# Patient Record
Sex: Female | Born: 1980 | Race: Black or African American | Hispanic: No | Marital: Married | Smoking: Never smoker
Health system: Southern US, Community
[De-identification: ages and names within clinical notes are randomized; demographics above are authoritative.]

## PROBLEM LIST (undated history)

## (undated) ENCOUNTER — Inpatient Hospital Stay (HOSPITAL_COMMUNITY): Payer: Self-pay

## (undated) DIAGNOSIS — K289 Gastrojejunal ulcer, unspecified as acute or chronic, without hemorrhage or perforation: Secondary | ICD-10-CM

## (undated) HISTORY — DX: Gastrojejunal ulcer, unspecified as acute or chronic, without hemorrhage or perforation: K28.9

---

## 2014-10-25 NOTE — L&D Delivery Note (Cosign Needed)
Delivery Note At 8:24 AM a viable female was delivered via  (Presentation: OA).  APGAR: 9, 10; weight pending .   Placenta status: Intact, Spontaneous.  Cord: 3v  with the following complications:  Anesthesia:  none Episiotomy:  none Lacerations:  none Suture Repair: none Est. Blood Loss (mL):  Est 300cc  Mom to postpartum.  Baby to Couplet care / Skin to Skin.  Upon arrival patient was complete and pushing, after progressing quickly, with SROM at 0800. She pushed with good maternal effort, FHT decreased HR to 90s, placed on oxygen with improvement, continued pushing to deliver a healthy boy. Baby delivered without difficulty, loose nuchal cord easily reduced on delivery, was noted to have good tone and place on maternal abdomen for oral suctioning, drying and stimulation. Delayed cord clamping performed and cut. Placenta delivered intact with 3V cord. Vaginal canal and perineum was inspected and  intact. Pitocin was started and uterus massaged until bleeding slowed. Counts of sharps, instruments, and lap pads were all correct.    Priscilla PilarAlexander Karamalegos, DO [facility], PGY-2 03/14/2015, 8:35 AM  I was present for the delivery and agree the above assessment. CRESENZO-DISHMAN,Priscilla Delgado 03/15/2015 11:07 AM

## 2014-12-17 ENCOUNTER — Encounter (HOSPITAL_COMMUNITY): Payer: Self-pay | Admitting: *Deleted

## 2014-12-17 ENCOUNTER — Inpatient Hospital Stay (HOSPITAL_COMMUNITY)
Admission: AD | Admit: 2014-12-17 | Discharge: 2014-12-17 | Disposition: A | Discharge status: Home / Self Care | Payer: Medicaid Other | Source: Ambulatory Visit | Attending: Family Medicine | Admitting: Family Medicine

## 2014-12-17 DIAGNOSIS — R109 Unspecified abdominal pain: Secondary | ICD-10-CM | POA: Diagnosis present

## 2014-12-17 DIAGNOSIS — R1031 Right lower quadrant pain: Secondary | ICD-10-CM | POA: Diagnosis not present

## 2014-12-17 DIAGNOSIS — O26899 Other specified pregnancy related conditions, unspecified trimester: Secondary | ICD-10-CM

## 2014-12-17 DIAGNOSIS — Z3A24 24 weeks gestation of pregnancy: Secondary | ICD-10-CM | POA: Insufficient documentation

## 2014-12-17 DIAGNOSIS — K5900 Constipation, unspecified: Secondary | ICD-10-CM | POA: Diagnosis not present

## 2014-12-17 DIAGNOSIS — N949 Unspecified condition associated with female genital organs and menstrual cycle: Secondary | ICD-10-CM

## 2014-12-17 DIAGNOSIS — O9989 Other specified diseases and conditions complicating pregnancy, childbirth and the puerperium: Secondary | ICD-10-CM | POA: Insufficient documentation

## 2014-12-17 DIAGNOSIS — R102 Pelvic and perineal pain: Secondary | ICD-10-CM

## 2014-12-17 LAB — FETAL FIBRONECTIN: Fetal Fibronectin: NEGATIVE

## 2014-12-17 LAB — URINALYSIS, ROUTINE W REFLEX MICROSCOPIC
Bilirubin Urine: NEGATIVE
GLUCOSE, UA: NEGATIVE mg/dL
Ketones, ur: 15 mg/dL — AB
Leukocytes, UA: NEGATIVE
Nitrite: NEGATIVE
Protein, ur: NEGATIVE mg/dL
Specific Gravity, Urine: >1.03 — ABNORMAL HIGH (ref 1.005–1.030)
Urobilinogen, UA: 0.2 mg/dL (ref 0.0–1.0)
pH: 5.5 (ref 5.0–8.0)

## 2014-12-17 LAB — CBC
HCT: 34.4 % — ABNORMAL LOW (ref 36.0–46.0)
Hemoglobin: 11.7 g/dL — ABNORMAL LOW (ref 12.0–15.0)
MCH: 30.2 pg (ref 26.0–34.0)
MCHC: 34 g/dL (ref 30.0–36.0)
MCV: 88.7 fL (ref 78.0–100.0)
PLATELETS: 176 10*3/uL (ref 150–400)
RBC: 3.88 MIL/uL (ref 3.87–5.11)
RDW: 14 % (ref 11.5–15.5)
WBC: 7 10*3/uL (ref 4.0–10.5)

## 2014-12-17 LAB — URINE MICROSCOPIC-ADD ON

## 2014-12-17 NOTE — MAU Note (Signed)
No pain with urination, can be difficult to pee. Has been constipated

## 2014-12-17 NOTE — MAU Provider Note (Signed)
  History     CSN: [id number]  Arrival date and time: 12/17/14 1442   First Provider Initiated Contact with Patient 12/17/14 1641      Chief Complaint  Patient presents with  . Abdominal Pain   Abdominal Pain Associated symptoms include constipation and dysuria. Pertinent negatives include no diarrhea, fever, nausea or vomiting.   34yo female presenting today for pelvic pain that first started this morning. History taken with aid of Swahili interpreter. Denies nausea, vomiting, fevers. Denies contractionsAdmits to constipation and burning with urination since this morning. Pain located on right side of pelvis. No further concerns today.  OB History    Gravida Para Term Preterm AB TAB SAB Ectopic Multiple Living   3 2 2       2       No past medical history on file.  No past surgical history on file.  No family history on file.  History  Substance Use Topics  . Smoking status: Not on file  . Smokeless tobacco: Not on file  . Alcohol Use: Not on file    Allergies: No Known Allergies  Prescriptions prior to admission  Medication Sig Dispense Refill Last Dose  . Prenatal Vit-Fe Fumarate-FA (PRENATAL MULTIVITAMIN) TABS tablet Take 1 tablet by mouth daily at 12 noon.   12/17/2014 at Unknown time    Review of Systems  Constitutional: Negative for fever.  Cardiovascular: Negative for chest pain.  Gastrointestinal: Positive for abdominal pain and constipation. Negative for nausea, vomiting and diarrhea.  Genitourinary: Positive for dysuria.   Physical Exam   Blood pressure 116/54, pulse 66, temperature 98.2 F (36.8 C), temperature source Oral, resp. rate 18, weight 116.574 kg (257 lb).  Physical Exam  Constitutional: She appears well-developed and well-nourished. No distress.  Respiratory: Breath sounds normal. No respiratory distress. She has no wheezes.  GI: Soft. There is no tenderness.  Musculoskeletal: She exhibits no edema.  Tenderness with palpation of right  round ligament, improved after OMM    MAU Course  Procedures  MDM OMM- myofascial release done on right round ligament with improvement of pain UA: large hemoglobin, calcium oxalate crystals Fetal Fibronectin- negative  Assessment and Plan  # Round Ligament Pain - OMM right round ligament--improvement in pain  # Constipation  I have seen and assessed this patient, and agree with the above resident's note.   Cervix 0/thick/high (1cm external os) Pt reports bilateral inguinal pain, worse on right, with increased pain with movement/position change.  Teaching done about round ligament pain, use rest/ice/heat/Tylenol PRN.  Increase PO fluids. Keep scheduled appt in [facility] on Thursday.    LEFTWICH-KIRBY, Kalan Yeley 12/17/2014, 9:07 PM

## 2014-12-17 NOTE — Discharge Instructions (Signed)

## 2014-12-17 NOTE — MAU Note (Signed)
Pain in RLQ started this morning. Hasn't gotten worse

## 2014-12-19 ENCOUNTER — Ambulatory Visit (INDEPENDENT_AMBULATORY_CARE_PROVIDER_SITE_OTHER): Payer: Medicaid Other | Admitting: Family Medicine

## 2014-12-19 ENCOUNTER — Encounter: Payer: Self-pay | Admitting: Family Medicine

## 2014-12-19 VITALS — BP 98/53 | HR 80 | Temp 97.7°F | Ht 64.75 in | Wt 253.3 lb

## 2014-12-19 DIAGNOSIS — Z113 Encounter for screening for infections with a predominantly sexual mode of transmission: Secondary | ICD-10-CM

## 2014-12-19 DIAGNOSIS — Z3492 Encounter for supervision of normal pregnancy, unspecified, second trimester: Secondary | ICD-10-CM

## 2014-12-19 DIAGNOSIS — Z1151 Encounter for screening for human papillomavirus (HPV): Secondary | ICD-10-CM | POA: Diagnosis not present

## 2014-12-19 DIAGNOSIS — Z124 Encounter for screening for malignant neoplasm of cervix: Secondary | ICD-10-CM

## 2014-12-19 DIAGNOSIS — Z118 Encounter for screening for other infectious and parasitic diseases: Secondary | ICD-10-CM

## 2014-12-19 LAB — POCT URINALYSIS DIP (DEVICE)
Bilirubin Urine: NEGATIVE
Glucose, UA: NEGATIVE mg/dL
Hgb urine dipstick: NEGATIVE
KETONES UR: 15 mg/dL — AB
Leukocytes, UA: NEGATIVE
Nitrite: NEGATIVE
Protein, ur: NEGATIVE mg/dL
SPECIFIC GRAVITY, URINE: 1.02 (ref 1.005–1.030)
Urobilinogen, UA: 0.2 mg/dL (ref 0.0–1.0)
pH: 6 (ref 5.0–8.0)

## 2014-12-19 LAB — CULTURE, OB URINE
Colony Count: 40000
Special Requests: NORMAL

## 2014-12-19 MED ORDER — PRENATAL VITAMINS PLUS 27-1 MG PO TABS: 1.0000 | ORAL_TABLET | Freq: Every day | ORAL | Status: DC

## 2014-12-19 MED ORDER — DOCUSATE SODIUM 250 MG PO CAPS: 250.0000 mg | ORAL_CAPSULE | Freq: Every day | ORAL | Status: DC

## 2014-12-19 MED ORDER — FAMOTIDINE 40 MG PO TABS: 40.0000 mg | ORAL_TABLET | Freq: Every day | ORAL | Status: DC

## 2014-12-19 NOTE — Progress Notes (Signed)
Nutrition note: 1st visit consult Pt has h/o obesity. Pt has gained 18.3# @ 4679w5d, which is > expected. Pt reports eating 2-3x/d. Pt is unsure if she is taking a PNV; reports she thinks she has a bottle of vitamin C at home. Pt reports no N/V but has heartburn occ. NKFA. Pt received verbal & written education via an interpreter about general nutrition during pregnancy. Provided a note for her case worker to see what vitamins she has been taking & to encourage pt to take 1 PNV daily. Encouraged protein foods with all meals & snacks. Discussed wt gain goals of 11-20# or 0.5#/wk. Pt agrees to take a PNV daily. Pt has WIC & plans to BF. F/u as needed Blondell RevealLaura Chanice Brenton, MS, RD, LDN, [facility]

## 2014-12-19 NOTE — Progress Notes (Signed)
   Subjective:    Priscilla Delgado is a [id number] 7839w5d being seen today for her first obstetrical visit.  Her obstetrical history is significant for denies. Patient does intend to breast feed. Pregnancy history fully reviewed.  Pain in right breast x 1year +, reports having had an abscess once and since has had some pain.  Patient reports no bleeding, no contractions, no cramping and no leaking.  +reflux, +constipation  Filed Vitals:   12/19/14 1036 12/19/14 1037  BP: 98/53   Pulse: 80   Temp: 97.7 F (36.5 C)   Height:  5\' 4"  (1.626 m)  Weight: 253 lb 4.8 oz (114.896 kg)     HISTORY: OB History  Gravida Para Term Preterm AB SAB TAB Ectopic Multiple Living  3 2 2  0 0 0 0 0 0 2    # Outcome Date GA Lbr Len/2nd Weight Sex Delivery Anes PTL Lv  3 Current           2 Term 07/03/10 1772w0d  5 lb 8.2 oz (2.5 kg) F Vag-Spont   Y  1 Term 10/19/08 4872w0d  6 lb 9.8 oz (3 kg) M CS-Unspec Spinal  Y     Past Medical History  Diagnosis Date  . Ulcer of the stomach and intestine    Past Surgical History  Procedure Laterality Date  . Cesarean section     2/2 footling breech  History reviewed. No pertinent family history.   Exam    Uterus:     Pelvic Exam:    Perineum: No Hemorrhoids, Normal Perineum   Vulva: normal   Vagina:  normal mucosa       Cervix: no bleeding following Pap and no cervical motion tenderness   Adnexa: normal adnexa   Bony Pelvis: average  System: Breast:  normal appearance, no masses or tenderness   Skin: normal coloration and turgor, no rashes    Neurologic: oriented   Extremities: normal strength, tone, and muscle mass   HEENT PERRLA   Mouth/Teeth mucous membranes moist, pharynx normal without lesions   Neck supple and no masses   Cardiovascular: Normal rate   Respiratory:  appears well, vitals normal, no respiratory distress, acyanotic, normal RR, ear and throat exam is normal, neck free of mass or lymphadenopathy, chest clear, no wheezing, crepitations,  rhonchi, normal symmetric air entry   Abdomen: soft, non-tender; bowel sounds normal; no masses,  no organomegaly   Urinary: urethral meatus normal      Assessment:    Pregnancy: G3P2002 There are no active problems to display for this patient.       Plan:     Initial labs drawn. Prenatal vitamins. Problem list reviewed and updated. Genetic Screening discussed Quad Screen: requested.  Ultrasound discussed; fetal survey: requested.  Follow up in 4 weeks. 50% of 20 min visit spent on counseling and coordination of care.    Breast pain: no masses/skin changes/abnormalities on exam  Gave rx for colace, PNV, pepcid  Terriona Horlacher ROCIO 12/19/2014

## 2014-12-19 NOTE — Progress Notes (Signed)
Patient unsure of LMP, thinks sometime in September; needs RX for prenatal vitamins, needs RX for stool softeners- reports difficulty having bowel movement  Aline used for interpreter

## 2014-12-20 LAB — PRENATAL PROFILE (SOLSTAS)
Antibody Screen: NEGATIVE
BASOS ABS: 0 10*3/uL (ref 0.0–0.1)
BASOS PCT: 0 % (ref 0–1)
EOS PCT: 1 % (ref 0–5)
Eosinophils Absolute: 0.1 10*3/uL (ref 0.0–0.7)
HCT: 34.9 % — ABNORMAL LOW (ref 36.0–46.0)
HEP B S AG: NEGATIVE
HIV 1&2 Ab, 4th Generation: NONREACTIVE
Hemoglobin: 11.9 g/dL — ABNORMAL LOW (ref 12.0–15.0)
LYMPHS PCT: 20 % (ref 12–46)
Lymphs Abs: 1.2 10*3/uL (ref 0.7–4.0)
MCH: 30 pg (ref 26.0–34.0)
MCHC: 34.1 g/dL (ref 30.0–36.0)
MCV: 87.9 fL (ref 78.0–100.0)
MPV: 11.2 fL (ref 8.6–12.4)
Monocytes Absolute: 0.4 10*3/uL (ref 0.1–1.0)
Monocytes Relative: 6 % (ref 3–12)
NEUTROS ABS: 4.5 10*3/uL (ref 1.7–7.7)
NEUTROS PCT: 73 % (ref 43–77)
Platelets: 201 10*3/uL (ref 150–400)
RBC: 3.97 MIL/uL (ref 3.87–5.11)
RDW: 13.9 % (ref 11.5–15.5)
RUBELLA: 18.7 {index} — AB (ref <0.90)
Rh Type: POSITIVE
WBC: 6.1 10*3/uL (ref 4.0–10.5)

## 2014-12-20 LAB — PRESCRIPTION MONITORING PROFILE (19 PANEL)
Amphetamine/Meth: NEGATIVE ng/mL
BENZODIAZEPINE SCREEN, URINE: NEGATIVE ng/mL
Barbiturate Screen, Urine: NEGATIVE ng/mL
Buprenorphine, Urine: NEGATIVE ng/mL
CREATININE, URINE: 138.69 mg/dL (ref >20.0)
Cannabinoid Scrn, Ur: NEGATIVE ng/mL
Carisoprodol, Urine: NEGATIVE ng/mL
Cocaine Metabolites: NEGATIVE ng/mL
FENTANYL URINE: NEGATIVE ng/mL
MDMA URINE: NEGATIVE ng/mL
Meperidine, Ur: NEGATIVE ng/mL
Methadone Screen, Urine: NEGATIVE ng/mL
Methaqualone: NEGATIVE ng/mL
NITRITES URINE, INITIAL: NEGATIVE ug/mL
OPIATE SCREEN, URINE: NEGATIVE ng/mL
OXYCODONE SCRN UR: NEGATIVE ng/mL
PH URINE, INITIAL: 5.7 pH (ref 4.5–8.9)
PROPOXYPHENE: NEGATIVE ng/mL
Phencyclidine, Ur: NEGATIVE ng/mL
TAPENTADOLUR: NEGATIVE ng/mL
Tramadol Scrn, Ur: NEGATIVE ng/mL
ZOLPIDEM, URINE: NEGATIVE ng/mL

## 2014-12-20 LAB — GLUCOSE TOLERANCE, 1 HOUR (50G) W/O FASTING: GLUCOSE 1 HOUR GTT: 111 mg/dL (ref 70–140)

## 2014-12-21 DIAGNOSIS — Z3490 Encounter for supervision of normal pregnancy, unspecified, unspecified trimester: Secondary | ICD-10-CM | POA: Insufficient documentation

## 2014-12-23 LAB — HEMOGLOBINOPATHY EVALUATION
HGB F QUANT: 0 % (ref 0.0–2.0)
Hemoglobin Other: 0 %
Hgb A2 Quant: 2.7 % (ref 2.2–3.2)
Hgb A: 97.3 % (ref 96.8–97.8)
Hgb S Quant: 0 %

## 2014-12-23 LAB — CYTOLOGY - PAP

## 2014-12-26 ENCOUNTER — Ambulatory Visit (HOSPITAL_COMMUNITY)
Admission: RE | Admit: 2014-12-26 | Discharge: 2014-12-26 | Disposition: A | Discharge status: Home / Self Care | Payer: Medicaid Other | Source: Ambulatory Visit | Attending: Family Medicine | Admitting: Family Medicine

## 2014-12-26 DIAGNOSIS — Z3A27 27 weeks gestation of pregnancy: Secondary | ICD-10-CM | POA: Insufficient documentation

## 2014-12-26 DIAGNOSIS — O0932 Supervision of pregnancy with insufficient antenatal care, second trimester: Secondary | ICD-10-CM | POA: Diagnosis not present

## 2014-12-26 DIAGNOSIS — Z3689 Encounter for other specified antenatal screening: Secondary | ICD-10-CM | POA: Insufficient documentation

## 2014-12-26 DIAGNOSIS — Z36 Encounter for antenatal screening of mother: Secondary | ICD-10-CM | POA: Diagnosis present

## 2014-12-26 DIAGNOSIS — Z3492 Encounter for supervision of normal pregnancy, unspecified, second trimester: Secondary | ICD-10-CM

## 2015-01-16 ENCOUNTER — Ambulatory Visit (INDEPENDENT_AMBULATORY_CARE_PROVIDER_SITE_OTHER): Payer: Self-pay | Admitting: Family Medicine

## 2015-01-16 VITALS — BP 100/50 | HR 73 | Temp 98.3°F | Wt 259.3 lb

## 2015-01-16 DIAGNOSIS — Z3492 Encounter for supervision of normal pregnancy, unspecified, second trimester: Secondary | ICD-10-CM

## 2015-01-16 LAB — POCT URINALYSIS DIP (DEVICE)
Bilirubin Urine: NEGATIVE
Glucose, UA: NEGATIVE mg/dL
Hgb urine dipstick: NEGATIVE
KETONES UR: NEGATIVE mg/dL
Nitrite: NEGATIVE
PROTEIN: NEGATIVE mg/dL
Specific Gravity, Urine: 1.025 (ref 1.005–1.030)
UROBILINOGEN UA: 0.2 mg/dL (ref 0.0–1.0)
pH: 6 (ref 5.0–8.0)

## 2015-01-16 NOTE — Progress Notes (Signed)
Used Interpreter [employer].

## 2015-01-16 NOTE — Progress Notes (Signed)
Patient is 34 y.o. [id number] 6228w5d.  +FM, denies LOF, VB, contractions, vaginal discharge.  Overall feeling well. - #abn pap: ASCUS: colpo at next visit, discussed with patient

## 2015-01-30 ENCOUNTER — Encounter: Payer: Self-pay | Admitting: Obstetrics and Gynecology

## 2015-01-30 ENCOUNTER — Ambulatory Visit (INDEPENDENT_AMBULATORY_CARE_PROVIDER_SITE_OTHER): Payer: Medicaid Other | Admitting: Obstetrics and Gynecology

## 2015-01-30 VITALS — BP 102/52 | HR 75 | Wt 258.1 lb

## 2015-01-30 DIAGNOSIS — Z23 Encounter for immunization: Secondary | ICD-10-CM

## 2015-01-30 DIAGNOSIS — Z98891 History of uterine scar from previous surgery: Secondary | ICD-10-CM | POA: Insufficient documentation

## 2015-01-30 DIAGNOSIS — O3421 Maternal care for scar from previous cesarean delivery: Secondary | ICD-10-CM

## 2015-01-30 DIAGNOSIS — Z3493 Encounter for supervision of normal pregnancy, unspecified, third trimester: Secondary | ICD-10-CM

## 2015-01-30 DIAGNOSIS — Z3492 Encounter for supervision of normal pregnancy, unspecified, second trimester: Secondary | ICD-10-CM

## 2015-01-30 DIAGNOSIS — O34219 Maternal care for unspecified type scar from previous cesarean delivery: Secondary | ICD-10-CM

## 2015-01-30 LAB — CBC
HCT: 36.5 % (ref 36.0–46.0)
Hemoglobin: 12.2 g/dL (ref 12.0–15.0)
MCH: 30 pg (ref 26.0–34.0)
MCHC: 33.4 g/dL (ref 30.0–36.0)
MCV: 89.7 fL (ref 78.0–100.0)
MPV: 11.4 fL (ref 8.6–12.4)
PLATELETS: 181 10*3/uL (ref 150–400)
RBC: 4.07 MIL/uL (ref 3.87–5.11)
RDW: 13.8 % (ref 11.5–15.5)
WBC: 7.9 10*3/uL (ref 4.0–10.5)

## 2015-01-30 LAB — POCT URINALYSIS DIP (DEVICE)
Bilirubin Urine: NEGATIVE
Glucose, UA: NEGATIVE mg/dL
HGB URINE DIPSTICK: NEGATIVE
Ketones, ur: NEGATIVE mg/dL
NITRITE: NEGATIVE
PH: 7 (ref 5.0–8.0)
PROTEIN: NEGATIVE mg/dL
Specific Gravity, Urine: 1.02 (ref 1.005–1.030)
UROBILINOGEN UA: 0.2 mg/dL (ref 0.0–1.0)

## 2015-01-30 MED ORDER — TETANUS-DIPHTH-ACELL PERTUSSIS 5-2.5-18.5 LF-MCG/0.5 IM SUSP
0.5000 mL | Freq: Once | INTRAMUSCULAR | Status: AC
Start: 2015-01-30 — End: 2015-01-30
  Administered 2015-01-30: 0.5 mL via INTRAMUSCULAR

## 2015-01-30 MED ORDER — FAMOTIDINE 40 MG PO TABS: 40.0000 mg | ORAL_TABLET | Freq: Every day | ORAL | Status: DC

## 2015-01-30 NOTE — Progress Notes (Signed)
28 week labs today.  

## 2015-01-30 NOTE — Progress Notes (Signed)
Small leuks in urine.  

## 2015-01-30 NOTE — Progress Notes (Signed)
Patient is doing well without complaints. FM/PTL precautions reviewed. Patient reports experiencing some epigastric pain with food. She did not try to the prescribed pepcid. Advised to try pepcid for now. 1 hr GCT and labs today

## 2015-01-31 LAB — HIV ANTIBODY (ROUTINE TESTING W REFLEX): HIV: NONREACTIVE

## 2015-01-31 LAB — GLUCOSE TOLERANCE, 1 HOUR (50G) W/O FASTING: GLUCOSE 1 HOUR GTT: 74 mg/dL (ref 70–140)

## 2015-01-31 LAB — RPR

## 2015-02-12 ENCOUNTER — Encounter: Payer: Self-pay | Admitting: *Deleted

## 2015-02-13 ENCOUNTER — Ambulatory Visit (INDEPENDENT_AMBULATORY_CARE_PROVIDER_SITE_OTHER): Payer: Medicaid Other | Admitting: Obstetrics & Gynecology

## 2015-02-13 VITALS — BP 91/50 | HR 75 | Temp 97.9°F | Wt 257.8 lb

## 2015-02-13 DIAGNOSIS — Z3483 Encounter for supervision of other normal pregnancy, third trimester: Secondary | ICD-10-CM

## 2015-02-13 DIAGNOSIS — R238 Other skin changes: Secondary | ICD-10-CM

## 2015-02-13 DIAGNOSIS — T148XXA Other injury of unspecified body region, initial encounter: Secondary | ICD-10-CM

## 2015-02-13 LAB — POCT URINALYSIS DIP (DEVICE)
Bilirubin Urine: NEGATIVE
Glucose, UA: NEGATIVE mg/dL
HGB URINE DIPSTICK: NEGATIVE
Ketones, ur: NEGATIVE mg/dL
NITRITE: NEGATIVE
PH: 7 (ref 5.0–8.0)
PROTEIN: NEGATIVE mg/dL
Specific Gravity, Urine: 1.02 (ref 1.005–1.030)
UROBILINOGEN UA: 0.2 mg/dL (ref 0.0–1.0)

## 2015-02-13 MED ORDER — BACITRACIN 500 UNIT/GM EX OINT: 1.0000 "application " | TOPICAL_OINTMENT | Freq: Two times a day (BID) | CUTANEOUS | Status: DC

## 2015-02-13 NOTE — Patient Instructions (Signed)
Third Trimester of Pregnancy The third trimester is from week 29 through week 42, months 7 through 9. The third trimester is a time when the fetus is growing rapidly. At the end of the ninth month, the fetus is about 20 inches in length and weighs 6-10 pounds.  BODY CHANGES Your body goes through many changes during pregnancy. The changes vary from woman to woman.   Your weight will continue to increase. You can expect to gain 25-35 pounds (11-16 kg) by the end of the pregnancy.  You may begin to get stretch marks on your hips, abdomen, and breasts.  You may urinate more often because the fetus is moving lower into your pelvis and pressing on your bladder.  You may develop or continue to have heartburn as a result of your pregnancy.  You may develop constipation because certain hormones are causing the muscles that push waste through your intestines to slow down.  You may develop hemorrhoids or swollen, bulging veins (varicose veins).  You may have pelvic pain because of the weight gain and pregnancy hormones relaxing your joints between the bones in your pelvis. Backaches may result from overexertion of the muscles supporting your posture.  You may have changes in your hair. These can include thickening of your hair, rapid growth, and changes in texture. Some women also have hair loss during or after pregnancy, or hair that feels dry or thin. Your hair will most likely return to normal after your baby is born.  Your breasts will continue to grow and be tender. A yellow discharge may leak from your breasts called colostrum.  Your belly button may stick out.  You may feel short of breath because of your expanding uterus.  You may notice the fetus "dropping," or moving lower in your abdomen.  You may have a bloody mucus discharge. This usually occurs a few days to a week before labor begins.  Your cervix becomes thin and soft (effaced) near your due date. WHAT TO EXPECT AT YOUR PRENATAL  EXAMS  You will have prenatal exams every 2 weeks until week 36. Then, you will have weekly prenatal exams. During a routine prenatal visit:  You will be weighed to make sure you and the fetus are growing normally.  Your blood pressure is taken.  Your abdomen will be measured to track your baby's growth.  The fetal heartbeat will be listened to.  Any test results from the previous visit will be discussed.  You may have a cervical check near your due date to see if you have effaced. At around 36 weeks, your caregiver will check your cervix. At the same time, your caregiver will also perform a test on the secretions of the vaginal tissue. This test is to determine if a type of bacteria, Group B streptococcus, is present. Your caregiver will explain this further. Your caregiver may ask you:  What your birth plan is.  How you are feeling.  If you are feeling the baby move.  If you have had any abnormal symptoms, such as leaking fluid, bleeding, severe headaches, or abdominal cramping.  If you have any questions. Other tests or screenings that may be performed during your third trimester include:  Blood tests that check for low iron levels (anemia).  Fetal testing to check the health, activity level, and growth of the fetus. Testing is done if you have certain medical conditions or if there are problems during the pregnancy. FALSE LABOR You may feel small, irregular contractions that   eventually go away. These are called Braxton Hicks contractions, or false labor. Contractions may last for hours, days, or even weeks before true labor sets in. If contractions come at regular intervals, intensify, or become painful, it is best to be seen by your caregiver.  SIGNS OF LABOR   Menstrual-like cramps.  Contractions that are 5 minutes apart or less.  Contractions that start on the top of the uterus and spread down to the lower abdomen and back.  A sense of increased pelvic pressure or back  pain.  A watery or bloody mucus discharge that comes from the vagina. If you have any of these signs before the 37th week of pregnancy, call your caregiver right away. You need to go to the hospital to get checked immediately. HOME CARE INSTRUCTIONS   Avoid all smoking, herbs, alcohol, and unprescribed drugs. These chemicals affect the formation and growth of the baby.  Follow your caregiver's instructions regarding medicine use. There are medicines that are either safe or unsafe to take during pregnancy.  Exercise only as directed by your caregiver. Experiencing uterine cramps is a good sign to stop exercising.  Continue to eat regular, healthy meals.  Wear a good support bra for breast tenderness.  Do not use hot tubs, steam rooms, or saunas.  Wear your seat belt at all times when driving.  Avoid raw meat, uncooked cheese, cat litter boxes, and soil used by cats. These carry germs that can cause birth defects in the baby.  Take your prenatal vitamins.  Try taking a stool softener (if your caregiver approves) if you develop constipation. Eat more high-fiber foods, such as fresh vegetables or fruit and whole grains. Drink plenty of fluids to keep your urine clear or pale yellow.  Take warm sitz baths to soothe any pain or discomfort caused by hemorrhoids. Use hemorrhoid cream if your caregiver approves.  If you develop varicose veins, wear support hose. Elevate your feet for 15 minutes, 3-4 times a day. Limit salt in your diet.  Avoid heavy lifting, wear low heal shoes, and practice good posture.  Rest a lot with your legs elevated if you have leg cramps or low back pain.  Visit your dentist if you have not gone during your pregnancy. Use a soft toothbrush to brush your teeth and be gentle when you floss.  A sexual relationship may be continued unless your caregiver directs you otherwise.  Do not travel far distances unless it is absolutely necessary and only with the approval  of your caregiver.  Take prenatal classes to understand, practice, and ask questions about the labor and delivery.  Make a trial run to the hospital.  Pack your hospital bag.  Prepare the baby's nursery.  Continue to go to all your prenatal visits as directed by your caregiver. SEEK MEDICAL CARE IF:  You are unsure if you are in labor or if your water has broken.  You have dizziness.  You have mild pelvic cramps, pelvic pressure, or nagging pain in your abdominal area.  You have persistent nausea, vomiting, or diarrhea.  You have a bad smelling vaginal discharge.  You have pain with urination. SEEK IMMEDIATE MEDICAL CARE IF:   You have a fever.  You are leaking fluid from your vagina.  You have spotting or bleeding from your vagina.  You have severe abdominal cramping or pain.  You have rapid weight loss or gain.  You have shortness of breath with chest pain.  You notice sudden or extreme swelling   of your face, hands, ankles, feet, or legs.  You have not felt your baby move in over an hour.  You have severe headaches that do not go away with medicine.  You have vision changes. Document Released: 10/05/2001 Document Revised: 10/16/2013 Document Reviewed: 12/12/2012 ExitCare Patient Information 2015 [employer]. This information is not intended to replace advice given to you by your health care provider. Make sure you discuss any questions you have with your health care provider.  

## 2015-02-13 NOTE — Progress Notes (Signed)
Small leuks on UA 

## 2015-02-13 NOTE — Progress Notes (Signed)
No problems 568w5d Bacitracin to wound on left knee

## 2015-02-13 NOTE — Progress Notes (Signed)
Patient reports non healing wound for past 2 weeks that is bothering her.  Aline used for interpreter

## 2015-02-26 ENCOUNTER — Encounter: Payer: Medicaid Other | Admitting: Certified Nurse Midwife

## 2015-03-03 LAB — OB RESULTS CONSOLE GC/CHLAMYDIA
Chlamydia: NEGATIVE
GC PROBE AMP, GENITAL: NEGATIVE

## 2015-03-03 LAB — OB RESULTS CONSOLE GBS: GBS: POSITIVE

## 2015-03-05 ENCOUNTER — Encounter: Payer: Self-pay | Admitting: Family

## 2015-03-05 ENCOUNTER — Ambulatory Visit (INDEPENDENT_AMBULATORY_CARE_PROVIDER_SITE_OTHER): Payer: Medicaid Other | Admitting: Family

## 2015-03-05 VITALS — BP 109/54 | HR 68 | Temp 97.6°F | Wt 264.2 lb

## 2015-03-05 DIAGNOSIS — Z3492 Encounter for supervision of normal pregnancy, unspecified, second trimester: Secondary | ICD-10-CM

## 2015-03-05 DIAGNOSIS — Z3493 Encounter for supervision of normal pregnancy, unspecified, third trimester: Secondary | ICD-10-CM

## 2015-03-05 LAB — POCT URINALYSIS DIP (DEVICE)
Bilirubin Urine: NEGATIVE
Glucose, UA: NEGATIVE mg/dL
Hgb urine dipstick: NEGATIVE
Ketones, ur: NEGATIVE mg/dL
Nitrite: NEGATIVE
Protein, ur: NEGATIVE mg/dL
Specific Gravity, Urine: 1.025 (ref 1.005–1.030)
Urobilinogen, UA: 0.2 mg/dL (ref 0.0–1.0)
pH: 7 (ref 5.0–8.0)

## 2015-03-05 LAB — OB RESULTS CONSOLE GC/CHLAMYDIA
Chlamydia: NEGATIVE
Gonorrhea: NEGATIVE

## 2015-03-05 LAB — OB RESULTS CONSOLE GBS: GBS: POSITIVE

## 2015-03-05 MED ORDER — FAMOTIDINE 40 MG PO TABS: 40.0000 mg | ORAL_TABLET | Freq: Every day | ORAL | Status: DC

## 2015-03-05 MED ORDER — NITROFURANTOIN MONOHYD MACRO 100 MG PO CAPS: 100.0000 mg | ORAL_CAPSULE | Freq: Two times a day (BID) | ORAL | Status: DC

## 2015-03-05 MED ORDER — DOCUSATE SODIUM 250 MG PO CAPS: 250.0000 mg | ORAL_CAPSULE | Freq: Every day | ORAL | Status: DC

## 2015-03-05 NOTE — Progress Notes (Signed)
Patient would like refill on colace and pepcid  Aline Used for interpreter

## 2015-03-05 NOTE — Progress Notes (Signed)
Small leuks noted in urine.  

## 2015-03-05 NOTE — Progress Notes (Signed)
Upon review of initial ultrasound and uncertain LMP, EDD was changed to 03/27/15 which advances her EDD by three weeks.  Growth ultrasound ordered.  GBS and GC and chlamydia collected.

## 2015-03-05 NOTE — Addendum Note (Signed)
Addended by: Marlis EdelsonKARIM, Jinan Biggins N on: 03/05/2015 09:08 AM   Modules accepted: Orders, Medications

## 2015-03-05 NOTE — Progress Notes (Signed)
Small leuks in urine.  Reports dysuria last night.  RX Macrobid, urine culture sent to lab.

## 2015-03-06 LAB — GC/CHLAMYDIA PROBE AMP
CT Probe RNA: NEGATIVE
GC PROBE AMP APTIMA: NEGATIVE

## 2015-03-07 LAB — CULTURE, OB URINE: Colony Count: 3000

## 2015-03-07 LAB — CULTURE, BETA STREP (GROUP B ONLY)

## 2015-03-10 ENCOUNTER — Ambulatory Visit (HOSPITAL_COMMUNITY)
Admission: RE | Admit: 2015-03-10 | Discharge: 2015-03-10 | Disposition: A | Discharge status: Home / Self Care | Payer: Medicaid Other | Source: Ambulatory Visit | Attending: Family | Admitting: Family

## 2015-03-10 DIAGNOSIS — Z3493 Encounter for supervision of normal pregnancy, unspecified, third trimester: Secondary | ICD-10-CM | POA: Insufficient documentation

## 2015-03-12 ENCOUNTER — Encounter: Payer: Self-pay | Admitting: Certified Nurse Midwife

## 2015-03-12 ENCOUNTER — Ambulatory Visit (INDEPENDENT_AMBULATORY_CARE_PROVIDER_SITE_OTHER): Payer: Medicaid Other | Admitting: Certified Nurse Midwife

## 2015-03-12 VITALS — BP 90/58 | HR 63 | Temp 98.1°F | Wt 266.7 lb

## 2015-03-12 DIAGNOSIS — Z3493 Encounter for supervision of normal pregnancy, unspecified, third trimester: Secondary | ICD-10-CM | POA: Diagnosis not present

## 2015-03-12 DIAGNOSIS — O4103X1 Oligohydramnios, third trimester, fetus 1: Secondary | ICD-10-CM

## 2015-03-12 LAB — POCT URINALYSIS DIP (DEVICE)
Bilirubin Urine: NEGATIVE
Glucose, UA: NEGATIVE mg/dL
Hgb urine dipstick: NEGATIVE
Ketones, ur: NEGATIVE mg/dL
Nitrite: NEGATIVE
Protein, ur: NEGATIVE mg/dL
Specific Gravity, Urine: 1.015 (ref 1.005–1.030)
Urobilinogen, UA: 0.2 mg/dL (ref 0.0–1.0)
pH: 7 (ref 5.0–8.0)

## 2015-03-12 NOTE — Patient Instructions (Signed)
Trial of Labor After Cesarean Delivery Information A trial of labor after cesarean delivery (TOLAC) is when a woman tries to give birth vaginally after a previous cesarean delivery. TOLAC may be a safe and appropriate option for you depending on your medical history and other risk factors. When TOLAC is successful and you are able to have a vaginal delivery, this is called a vaginal birth after cesarean delivery (VBAC).  CANDIDATES FOR TOLAC TOLAC is possible for some women who:  Have undergone one or two prior cesarean deliveries in which the incision of the uterus was horizontal (low transverse).  Are carrying twins and have had one prior low transverse incision during a cesarean delivery.  Do not have a vertical (classical) uterine scar.  Have not had a tear in the wall of their uterus (uterine rupture). TOLAC is also supported for women who meet appropriate criteria and:  Are under the age of 40 years.  Are tall and have a body mass index (BMI) of less than 30.  Have an unknown uterine scar.  Give birth in a facility equipped to handle an emergency cesarean delivery. This team should be able to handle possible complications such as a uterine rupture.  Have thorough counseling about the benefits and risks of TOLAC.  Have discussed future pregnancy plans with their health care provider.  Plan to have several more pregnancies. MOST SUCCESSFUL CANDIDATES FOR TOLAC:  Have had a successful vaginal delivery before or after their cesarean delivery.  Experience labor that begins naturally on or before the due date (40 weeks of gestation).  Do not have a very large (macrosomic) baby.   Had a prior cesarean delivery but are not currently experiencing factors that would prompt a cesarean delivery (such as a breech position).  Had only one prior cesarean delivery.  Had a prior cesarean delivery that was performed early in labor and not after full cervical dilation. TOLAC may be most  appropriate for women who meet the above guidelines and who plan to have more pregnancies. TOLAC is not recommended for home births. LEAST SUCCESSFUL CANDIDATES FOR TOLAC:  Have an induced labor with an unfavorable cervix. An unfavorable cervix is when the cervix is not dilating enough (among other factors).  Have never had a vaginal delivery.  Have had more than two cesarean deliveries.  Have a pregnancy at more than 40 weeks of gestation.  Are pregnant with a baby with a suspected weight greater than 4,000 grams (8 pounds) and who have no prior history of a vaginal delivery.  Have closely spaced pregnancies. SUGGESTED BENEFITS OF TOLAC  You may have a faster recovery time.  You may have a shorter stay in the hospital.  You may have less pain and fewer problems than with a cesarean delivery. Women who have a cesarean delivery have a higher chance of needing blood or getting a fever, an infection, or a blood clot in the legs. SUGGESTED RISKS OF TOLAC The highest risk of complications happens to women who attempt a TOLAC and fail. A failed TOLAC results in an unplanned cesarean delivery. Risks related to TOLAC or repeat cesarean deliveries include:   Blood loss.  Infection.  Blood clot.  Injury to surrounding tissues or organs.  Having to remove the uterus (hysterectomy).  Potential problems with the placenta (such as placenta previa or placenta accreta) in future pregnancies. Although very rare, the main concerns with TOLAC are:  Rupture of the uterine scar from a past cesarean delivery.  Needing an   emergency cesarean delivery.  Having a bad outcome for the baby (perinatal morbidity). FOR MORE INFORMATION American Congress of Obstetricians and Gynecologists: www.acog.org American College of Nurse-Midwives: www.midwife.org Document Released: 06/29/2011 Document Revised: 08/01/2013 Document Reviewed: 04/02/2013 ExitCare Patient Information 2015 [employer]. This  information is not intended to replace advice given to you by your health care provider. Make sure you discuss any questions you have with your health care provider.  

## 2015-03-12 NOTE — Progress Notes (Signed)
Pt is doing well. Confirmed that she wants to have a trial of labor. Discussed her ultrasound results and the change in her due date to 03/27/15. Discussed her low normal amniotic fluid and scheduled her to check AFI next week.

## 2015-03-12 NOTE — Progress Notes (Signed)
Interpreter [id number] used for this encounter.

## 2015-03-12 NOTE — Progress Notes (Signed)
Moderate leukocytes noted on urinalysis. 

## 2015-03-13 ENCOUNTER — Encounter (HOSPITAL_COMMUNITY): Payer: Self-pay | Admitting: *Deleted

## 2015-03-13 ENCOUNTER — Inpatient Hospital Stay (HOSPITAL_COMMUNITY): Payer: Medicaid Other

## 2015-03-13 ENCOUNTER — Inpatient Hospital Stay (EMERGENCY_DEPARTMENT_HOSPITAL)
Admission: AD | Admit: 2015-03-13 | Discharge: 2015-03-13 | Disposition: A | Discharge status: Home / Self Care | Payer: Medicaid Other | Source: Ambulatory Visit | Attending: Obstetrics & Gynecology | Admitting: Obstetrics & Gynecology

## 2015-03-13 DIAGNOSIS — Z3A38 38 weeks gestation of pregnancy: Secondary | ICD-10-CM | POA: Insufficient documentation

## 2015-03-13 DIAGNOSIS — Z3A37 37 weeks gestation of pregnancy: Secondary | ICD-10-CM

## 2015-03-13 DIAGNOSIS — O479 False labor, unspecified: Secondary | ICD-10-CM

## 2015-03-13 DIAGNOSIS — O471 False labor at or after 37 completed weeks of gestation: Secondary | ICD-10-CM | POA: Diagnosis not present

## 2015-03-13 DIAGNOSIS — O288 Other abnormal findings on antenatal screening of mother: Secondary | ICD-10-CM | POA: Insufficient documentation

## 2015-03-13 DIAGNOSIS — O34219 Maternal care for unspecified type scar from previous cesarean delivery: Secondary | ICD-10-CM

## 2015-03-13 NOTE — MAU Note (Signed)
F. Dishmon CNM given report that BPP was 8/8. RN to examine pt and then CNM to be down to assess pt.

## 2015-03-13 NOTE — MAU Provider Note (Signed)
None     Chief Complaint:  Labor Eval   Priscilla Delgado is  34 y.o. G3P2002 at 1528w0d presents complaining of Labor Eval .  She states irregular, every 5-30 minutes contractions are associated with a smear of  vaginal bleeding, intact membranes, along with active fetal movement. She has had 1 successful VBAC and plans a TOLAC.    Obstetrical/Gynecological History: OB History    Gravida Para Term Preterm AB TAB SAB Ectopic Multiple Living   3 2 2  0 0 0 0 0 0 2      Obstetric Comments   C/s for breech- leg was coming down     Past Medical History: Past Medical History  Diagnosis Date  . Ulcer of the stomach and intestine     Past Surgical History: Past Surgical History  Procedure Laterality Date  . Cesarean section      Family History: History reviewed. No pertinent family history.  Social History: History  Substance Use Topics  . Smoking status: Never Smoker   . Smokeless tobacco: Never Used  . Alcohol Use: No    Allergies: No Known Allergies  Meds:  No prescriptions prior to admission    Review of Systems   Constitutional: Negative for fever and chills Eyes: Negative for visual disturbances Respiratory: Negative for shortness of breath, dyspnea Cardiovascular: Negative for chest pain or palpitations  Gastrointestinal: Negative for vomiting, diarrhea and constipation Genitourinary: Negative for dysuria and urgency Musculoskeletal: Negative for back pain, joint pain, myalgias.  Normal ROM  Neurological: Negative for dizziness and headaches    Physical Exam  Blood pressure 107/63, pulse 66, temperature 98.7 F (37.1 C), resp. rate 18, height 5' 5.5" (1.664 m), weight 122.108 kg (269 lb 3.2 oz), last menstrual period 07/13/2014. GENERAL: Well-developed, well-nourished female in no acute distress.  LUNGS: Clear to auscultation bilaterally.  HEART: Regular rate and rhythm. ABDOMEN: Soft, nontender, nondistended, gravid.  EXTREMITIES: Nontender, no edema,  2+ distal pulses. DTR's 2+ CERVICAL EXAM: Dilatation 2cm   Effacement 60%   Station -3 . Recheck after 2 hours was unchanged Presentation: cephalic FHT:  Baseline rate 140 bpm   Variability moderate  Accelerations present   Decelerations none.  FHT technically reactive, but borderline.  BPP ordered and is 8/8 Contractions: Every mild    Labs: No results found for this or any previous visit (from the past 24 hour(s)). Imaging Studies:  BPP 8/8  Assessment: Priscilla Delgado is  34 y.o. G3P2002 at 5028w0d presents with braxton hicks contractions.  Plan: DC home.  [place] interpreter used.   CRESENZO-DISHMAN,Aldridge Krzyzanowski 5/19/201610:58 PM

## 2015-03-13 NOTE — MAU Note (Signed)
Pt to have BPP

## 2015-03-13 NOTE — MAU Note (Signed)
Interpreter on phone with pt and F Dishmon CNM talking about u/s results and labor instructions.

## 2015-03-13 NOTE — MAU Note (Addendum)
Pt c/o  having ctx since yesterday. reprots she had some bleeding this morning. Good fetal movement reported.Pt stated ctx every 30  Min.

## 2015-03-13 NOTE — MAU Note (Signed)
Contractions started yesterday, every .  Small amt of blood with mucous this morning, noted one time when went to restroom

## 2015-03-13 NOTE — Discharge Instructions (Signed)
Braxton Hicks Contractions °Contractions of the uterus can occur throughout pregnancy. Contractions are not always a sign that you are in labor.  °WHAT ARE BRAXTON HICKS CONTRACTIONS?  °Contractions that occur before labor are called Braxton Hicks contractions, or false labor. Toward the end of pregnancy (32-34 weeks), these contractions can develop more often and may become more forceful. This is not true labor because these contractions do not result in opening (dilatation) and thinning of the cervix. They are sometimes difficult to tell apart from true labor because these contractions can be forceful and people have different pain tolerances. You should not feel embarrassed if you go to the hospital with false labor. Sometimes, the only way to tell if you are in true labor is for your health care provider to look for changes in the cervix. °If there are no prenatal problems or other health problems associated with the pregnancy, it is completely safe to be sent home with false labor and await the onset of true labor. °HOW CAN YOU TELL THE DIFFERENCE BETWEEN TRUE AND FALSE LABOR? °False Labor °· The contractions of false labor are usually shorter and not as hard as those of true labor.   °· The contractions are usually irregular.   °· The contractions are often felt in the front of the lower abdomen and in the groin.   °· The contractions may go away when you walk around or change positions while lying down.   °· The contractions get weaker and are shorter lasting as time goes on.   °· The contractions do not usually become progressively stronger, regular, and closer together as with true labor.   °True Labor °· Contractions in true labor last 30-70 seconds, become very regular, usually become more intense, and increase in frequency.   °· The contractions do not go away with walking.   °· The discomfort is usually felt in the top of the uterus and spreads to the lower abdomen and low back.   °· True labor can be  determined by your health care provider with an exam. This will show that the cervix is dilating and getting thinner.   °WHAT TO REMEMBER °· Keep up with your usual exercises and follow other instructions given by your health care provider.   °· Take medicines as directed by your health care provider.   °· Keep your regular prenatal appointments.   °· Eat and drink lightly if you think you are going into labor.   °· If Braxton Hicks contractions are making you uncomfortable:   °¨ Change your position from lying down or resting to walking, or from walking to resting.   °¨ Sit and rest in a tub of warm water.   °¨ Drink 2-3 glasses of water. Dehydration may cause these contractions.   °¨ Do slow and deep breathing several times an hour.   °WHEN SHOULD I SEEK IMMEDIATE MEDICAL CARE? °Seek immediate medical care if: °· Your contractions become stronger, more regular, and closer together.   °· You have fluid leaking or gushing from your vagina.   °· You have a fever.   °· You pass blood-tinged mucus.   °· You have vaginal bleeding.   °· You have continuous abdominal pain.   °· You have low back pain that you never had before.   °· You feel your baby's head pushing down and causing pelvic pressure.   °· Your baby is not moving as much as it used to.   °Document Released: 10/11/2005 Document Revised: 10/16/2013 Document Reviewed: 07/23/2013 °ExitCare® Patient Information ©2015 [employer]. This information is not intended to replace advice given to you by your health care   provider. Make sure you discuss any questions you have with your health care provider. ° °

## 2015-03-14 ENCOUNTER — Encounter (HOSPITAL_COMMUNITY): Payer: Self-pay | Admitting: *Deleted

## 2015-03-14 ENCOUNTER — Inpatient Hospital Stay (HOSPITAL_COMMUNITY)
Admission: AD | Admit: 2015-03-14 | Discharge: 2015-03-16 | DRG: 775 | Disposition: A | Discharge status: Home / Self Care | Payer: Medicaid Other | Source: Ambulatory Visit | Attending: Obstetrics & Gynecology | Admitting: Obstetrics & Gynecology

## 2015-03-14 DIAGNOSIS — Z8711 Personal history of peptic ulcer disease: Secondary | ICD-10-CM

## 2015-03-14 DIAGNOSIS — Z3A38 38 weeks gestation of pregnancy: Secondary | ICD-10-CM | POA: Insufficient documentation

## 2015-03-14 DIAGNOSIS — O99824 Streptococcus B carrier state complicating childbirth: Secondary | ICD-10-CM | POA: Diagnosis present

## 2015-03-14 DIAGNOSIS — O34219 Maternal care for unspecified type scar from previous cesarean delivery: Secondary | ICD-10-CM

## 2015-03-14 DIAGNOSIS — O471 False labor at or after 37 completed weeks of gestation: Secondary | ICD-10-CM | POA: Diagnosis not present

## 2015-03-14 DIAGNOSIS — Z3A37 37 weeks gestation of pregnancy: Secondary | ICD-10-CM | POA: Diagnosis not present

## 2015-03-14 DIAGNOSIS — IMO0001 Reserved for inherently not codable concepts without codable children: Secondary | ICD-10-CM

## 2015-03-14 DIAGNOSIS — O3421 Maternal care for scar from previous cesarean delivery: Secondary | ICD-10-CM | POA: Diagnosis present

## 2015-03-14 DIAGNOSIS — O288 Other abnormal findings on antenatal screening of mother: Secondary | ICD-10-CM | POA: Insufficient documentation

## 2015-03-14 LAB — TYPE AND SCREEN
ABO/RH(D): O POS
Antibody Screen: NEGATIVE

## 2015-03-14 LAB — CBC
HCT: 44.1 % (ref 36.0–46.0)
Hemoglobin: 15.4 g/dL — ABNORMAL HIGH (ref 12.0–15.0)
MCH: 31 pg (ref 26.0–34.0)
MCHC: 34.9 g/dL (ref 30.0–36.0)
MCV: 88.7 fL (ref 78.0–100.0)
PLATELETS: 160 10*3/uL (ref 150–400)
RBC: 4.97 MIL/uL (ref 3.87–5.11)
RDW: 13.8 % (ref 11.5–15.5)
WBC: 8.1 10*3/uL (ref 4.0–10.5)

## 2015-03-14 LAB — RPR: RPR Ser Ql: NONREACTIVE

## 2015-03-14 LAB — ABO/RH: ABO/RH(D): O POS

## 2015-03-14 MED ORDER — PRENATAL MULTIVITAMIN CH
1.0000 | ORAL_TABLET | Freq: Every day | ORAL | Status: DC
  Administered 2015-03-15 – 2015-03-16 (×2): 1 via ORAL
  Filled 2015-03-14 (×2): qty 1

## 2015-03-14 MED ORDER — LACTATED RINGERS IV SOLN
INTRAVENOUS | Status: DC
  Administered 2015-03-14: 06:00:00 via INTRAVENOUS

## 2015-03-14 MED ORDER — DIPHENHYDRAMINE HCL 25 MG PO CAPS: 25.0000 mg | ORAL_CAPSULE | Freq: Four times a day (QID) | ORAL | Status: DC | PRN

## 2015-03-14 MED ORDER — FLEET ENEMA 7-19 GM/118ML RE ENEM: 1.0000 | ENEMA | RECTAL | Status: DC | PRN

## 2015-03-14 MED ORDER — PENICILLIN G POTASSIUM 5000000 UNITS IJ SOLR
2.5000 10*6.[IU] | INTRAVENOUS | Status: DC
  Filled 2015-03-14 (×3): qty 2.5

## 2015-03-14 MED ORDER — ONDANSETRON HCL 4 MG PO TABS: 4.0000 mg | ORAL_TABLET | ORAL | Status: DC | PRN

## 2015-03-14 MED ORDER — ONDANSETRON HCL 4 MG/2ML IJ SOLN: 4.0000 mg | INTRAMUSCULAR | Status: DC | PRN

## 2015-03-14 MED ORDER — OXYTOCIN BOLUS FROM INFUSION
500.0000 mL | INTRAVENOUS | Status: DC
  Administered 2015-03-14: 500 mL via INTRAVENOUS

## 2015-03-14 MED ORDER — WITCH HAZEL-GLYCERIN EX PADS: 1.0000 "application " | MEDICATED_PAD | CUTANEOUS | Status: DC | PRN

## 2015-03-14 MED ORDER — DIBUCAINE 1 % RE OINT: 1.0000 "application " | TOPICAL_OINTMENT | RECTAL | Status: DC | PRN

## 2015-03-14 MED ORDER — ACETAMINOPHEN 325 MG PO TABS: 650.0000 mg | ORAL_TABLET | ORAL | Status: DC | PRN

## 2015-03-14 MED ORDER — FERROUS SULFATE 325 (65 FE) MG PO TABS
325.0000 mg | ORAL_TABLET | Freq: Two times a day (BID) | ORAL | Status: DC
  Administered 2015-03-14 – 2015-03-16 (×4): 325 mg via ORAL
  Filled 2015-03-14 (×4): qty 1

## 2015-03-14 MED ORDER — SENNOSIDES-DOCUSATE SODIUM 8.6-50 MG PO TABS
2.0000 | ORAL_TABLET | ORAL | Status: DC
  Administered 2015-03-15 (×2): 2 via ORAL
  Filled 2015-03-14 (×2): qty 2

## 2015-03-14 MED ORDER — LACTATED RINGERS IV SOLN: 500.0000 mL | INTRAVENOUS | Status: DC | PRN

## 2015-03-14 MED ORDER — FLEET ENEMA 7-19 GM/118ML RE ENEM: 1.0000 | ENEMA | Freq: Every day | RECTAL | Status: DC | PRN

## 2015-03-14 MED ORDER — BENZOCAINE-MENTHOL 20-0.5 % EX AERO: 1.0000 "application " | INHALATION_SPRAY | CUTANEOUS | Status: DC | PRN

## 2015-03-14 MED ORDER — METHYLERGONOVINE MALEATE 0.2 MG PO TABS: 0.2000 mg | ORAL_TABLET | ORAL | Status: DC | PRN

## 2015-03-14 MED ORDER — BISACODYL 10 MG RE SUPP: 10.0000 mg | Freq: Every day | RECTAL | Status: DC | PRN

## 2015-03-14 MED ORDER — OXYTOCIN 40 UNITS IN LACTATED RINGERS INFUSION - SIMPLE MED
62.5000 mL/h | INTRAVENOUS | Status: DC
  Filled 2015-03-14: qty 1000

## 2015-03-14 MED ORDER — ONDANSETRON HCL 4 MG/2ML IJ SOLN: 4.0000 mg | Freq: Four times a day (QID) | INTRAMUSCULAR | Status: DC | PRN

## 2015-03-14 MED ORDER — MEASLES, MUMPS & RUBELLA VAC ~~LOC~~ INJ
0.5000 mL | INJECTION | Freq: Once | SUBCUTANEOUS | Status: DC
  Filled 2015-03-14: qty 0.5

## 2015-03-14 MED ORDER — ZOLPIDEM TARTRATE 5 MG PO TABS: 5.0000 mg | ORAL_TABLET | Freq: Every evening | ORAL | Status: DC | PRN

## 2015-03-14 MED ORDER — METHYLERGONOVINE MALEATE 0.2 MG/ML IJ SOLN: 0.2000 mg | INTRAMUSCULAR | Status: DC | PRN

## 2015-03-14 MED ORDER — TETANUS-DIPHTH-ACELL PERTUSSIS 5-2.5-18.5 LF-MCG/0.5 IM SUSP: 0.5000 mL | Freq: Once | INTRAMUSCULAR | Status: DC

## 2015-03-14 MED ORDER — LANOLIN HYDROUS EX OINT: TOPICAL_OINTMENT | CUTANEOUS | Status: DC | PRN

## 2015-03-14 MED ORDER — OXYCODONE-ACETAMINOPHEN 5-325 MG PO TABS
1.0000 | ORAL_TABLET | ORAL | Status: DC | PRN
  Administered 2015-03-14: 1 via ORAL
  Filled 2015-03-14: qty 1

## 2015-03-14 MED ORDER — OXYTOCIN 40 UNITS IN LACTATED RINGERS INFUSION - SIMPLE MED: 62.5000 mL/h | INTRAVENOUS | Status: DC | PRN

## 2015-03-14 MED ORDER — FENTANYL CITRATE (PF) 100 MCG/2ML IJ SOLN
100.0000 ug | INTRAMUSCULAR | Status: DC | PRN
  Administered 2015-03-14 (×2): 100 ug via INTRAVENOUS
  Filled 2015-03-14 (×2): qty 2

## 2015-03-14 MED ORDER — ACETAMINOPHEN 325 MG PO TABS
650.0000 mg | ORAL_TABLET | ORAL | Status: DC | PRN
  Administered 2015-03-15 – 2015-03-16 (×3): 650 mg via ORAL
  Filled 2015-03-14 (×3): qty 2

## 2015-03-14 MED ORDER — CITRIC ACID-SODIUM CITRATE 334-500 MG/5ML PO SOLN: 30.0000 mL | ORAL | Status: DC | PRN

## 2015-03-14 MED ORDER — OXYCODONE-ACETAMINOPHEN 5-325 MG PO TABS
2.0000 | ORAL_TABLET | ORAL | Status: DC | PRN
Start: 2015-03-14 — End: 2015-03-16

## 2015-03-14 MED ORDER — PENICILLIN G POTASSIUM 5000000 UNITS IJ SOLR
5.0000 10*6.[IU] | Freq: Once | INTRAVENOUS | Status: DC
  Filled 2015-03-14: qty 5

## 2015-03-14 MED ORDER — LIDOCAINE HCL (PF) 1 % IJ SOLN
30.0000 mL | INTRAMUSCULAR | Status: DC | PRN
  Filled 2015-03-14: qty 30

## 2015-03-14 MED ORDER — OXYCODONE-ACETAMINOPHEN 5-325 MG PO TABS: 2.0000 | ORAL_TABLET | ORAL | Status: DC | PRN

## 2015-03-14 MED ORDER — OXYCODONE-ACETAMINOPHEN 5-325 MG PO TABS: 1.0000 | ORAL_TABLET | ORAL | Status: DC | PRN

## 2015-03-14 MED ORDER — SIMETHICONE 80 MG PO CHEW
80.0000 mg | CHEWABLE_TABLET | ORAL | Status: DC | PRN
Start: 2015-03-14 — End: 2015-03-16

## 2015-03-14 MED ORDER — IBUPROFEN 600 MG PO TABS
600.0000 mg | ORAL_TABLET | Freq: Four times a day (QID) | ORAL | Status: DC
  Administered 2015-03-14 – 2015-03-16 (×10): 600 mg via ORAL
  Filled 2015-03-14 (×10): qty 1

## 2015-03-14 MED ORDER — SODIUM CHLORIDE 0.9 % IV SOLN
2.0000 g | Freq: Once | INTRAVENOUS | Status: AC
  Administered 2015-03-14: 2 g via INTRAVENOUS
  Filled 2015-03-14: qty 2000

## 2015-03-14 NOTE — H&P (Signed)
LABOR ADMISSION HISTORY AND PHYSICAL  Priscilla Delgado is a 34 y.o. female 563P2002 with IUP at 9529w1d by US (27wk) presenting for SOL. She reports +FM, + contractions, "bloody show" mucus. No LOF, no VB, no blurry vision, headaches or peripheral edema, and RUQ pain.  She plans on breast / bottle feeding. She request abstinence for birth control (husband unavailable)  Dating: By Eber Jones27wk US --->  Estimated Date of Delivery: 03/27/15  US: On 5/16, @ 6451w5d, CWD, normal anatomy, cephalic presentation, 3002g, 40%52% EFW   Prenatal History/Complications: - [facility]. Prior C/S (unspec), plan for TOLAC. No complications.  Past Medical History: Past Medical History  Diagnosis Date  . Ulcer of the stomach and intestine     Past Surgical History: Past Surgical History  Procedure Laterality Date  . Cesarean section      Obstetrical History: OB History    Gravida Para Term Preterm AB TAB SAB Ectopic Multiple Living   3 2 2  0 0 0 0 0 0 2      Obstetric Comments   C/s for breech- leg was coming down      Social History: History   Social History  . Marital Status: Married    Spouse Name: N/A  . Number of Children: N/A  . Years of Education: N/A   Social History Main Topics  . Smoking status: Never Smoker   . Smokeless tobacco: Never Used  . Alcohol Use: No  . Drug Use: No  . Sexual Activity: Not Currently   Other Topics Concern  . Not on file   Social History Narrative    Family History: No family history on file.  Allergies: No Known Allergies  Prescriptions prior to admission  Medication Sig Dispense Refill Last Dose  . docusate sodium (COLACE) 250 MG capsule Take 1 capsule (250 mg total) by mouth daily. 30 capsule 0 03/13/2015 at Unknown time  . famotidine (PEPCID) 40 MG tablet Take 1 tablet (40 mg total) by mouth daily. 30 tablet 3 03/13/2015 at Unknown time  . nitrofurantoin, macrocrystal-monohydrate, (MACROBID) 100 MG capsule Take 1 capsule (100 mg total) by mouth 2  (two) times daily. (Patient not taking: Reported on 03/13/2015) 14 capsule 1 Taking  . Prenatal Vit-Fe Fumarate-FA (PRENATAL VITAMINS PLUS) 27-1 MG TABS Take 1 tablet by mouth daily. 30 tablet 6 03/13/2015 at Unknown time     Review of Systems   All systems reviewed and negative except as stated in HPI  BP 121/68 mmHg  Pulse 93  Temp(Src) 97.5 F (36.4 C) (Oral)  Resp 18  SpO2 99%  LMP 07/13/2014 (LMP Unknown) General appearance: alert and cooperative, uncomfortable during contractions, NAD Lungs: clear to auscultation bilaterally Heart: regular rate and rhythm Abdomen: soft, non-tender; bowel sounds normal, appropriately gravid for GA Extremities: Homans sign is negative, no sign of DVT, edema DTR's +2 Presentation: cephalic (by most recent US, 5/16) Fetal monitoringBaseline: 135 bpm, Variability: Good {> 6 bpm), Accelerations: Reactive and Decelerations: Absent (earlier with some tracing difficulty, picked up maternal) Uterine activityFrequency: Every 3-5 minutes Dilation: 6 Effacement (%): 80 Station: -1 Exam by:: DCALLAWAY, RN   Prenatal labs: ABO, Rh: O/POS/-- (02/25 1436) Antibody: NEG (02/25 1436) Rubella:  immune (12/19/14) RPR: NON REAC (04/07 1236)  HBsAg: NEGATIVE (02/25 1436)  HIV: NONREACTIVE (04/07 1236)  GBS: Positive (05/11 0000)  1 hr Glucola early 111, 3rd trimester 74 Genetic screening  Not done Anatomy US normal at 27 w, uterine fibroid w/ growth at 31 wks  Prenatal  Transfer Tool  Maternal Diabetes: No Genetic Screening: Declined Maternal Ultrasounds/Referrals: Normal Fetal Ultrasounds or other Referrals:  None Maternal Substance Abuse:  No Significant Maternal Medications:  None Significant Maternal Lab Results: Lab values include: Group B Strep positive  No results found for this or any previous visit (from the past 24 hour(s)).  Patient Active Problem List   Diagnosis Date Noted  . Active labor 03/14/2015  . Wound of skin 02/13/2015  .  Previous cesarean section complicating pregnancy 01/30/2015  . Supervision of low-risk pregnancy 12/21/2014    Assessment: Priscilla Minssther Gohman is a 34 y.o. G3P2002 at 6456w1d admitted for SOL (TOLAC), intact membranes. She had previous arrived to MAU on 5/19 for irregular contractions, early stage cervix with 2/60/-3 unchanged after 3 hr. Had BPP done, 8/8. Sent home and since returned with progression of labor and cervical change.  #Labor: TOLAC. Expectant management. #Pain: Fentanyl IV PRN, may have epidural #FWB: Cat 1 (earlier with maternal tracing) #ID:  GBS positive - ordered Ampicillin 2g IV x 1 dose given advanced labor #MOF: Breast / Bottle #MOC: Abstinence (husband not available) #Circ:  Outpatient circ #Peds: TS  Saralyn PilarAlexander Karamalegos, DO [facility], PGY-2 5/20 at [phone]    I have seen and examined this patient and agree the above assessment. CRESENZO-DISHMAN,Bricia Taher 03/14/2015 6:41 AM

## 2015-03-14 NOTE — Lactation Note (Signed)
This note was copied from the chart of Priscilla Delgado. Lactation Consultation Note; Initial visit with mom with patient resource interpreter present. Experienced BF mom. Mom easily able to hand express Colostrum. Baby took several attempts on right nipple, which is a little flatter but compressible, then latched well to left and still nursing as I left room after 20 min. Mom reports her other babies did better on the left breast. Reviewed feeding cues and encouraged to feed whenever she sees them,. English BF brochure left with mom with our phone to call with an interpreter. Reviewed feeding diary with mom. No questions at present.   Patient Name: Priscilla Sheilah Minssther Caspers ZOXWR'UToday's Date: 03/14/2015 Reason for consult: Initial assessment   Maternal Data Formula Feeding for Exclusion: No Has patient been taught Hand Expression?: Yes Does the patient have breastfeeding experience prior to this delivery?: Yes  Feeding Feeding Type: Breast Fed  LATCH Score/Interventions Latch: Grasps breast easily, tongue down, lips flanged, rhythmical sucking.  Audible Swallowing: A few with stimulation Intervention(s): Hand expression;Skin to skin  Type of Nipple: Everted at rest and after stimulation  Comfort (Breast/Nipple): Soft / non-tender     Hold (Positioning): No assistance needed to correctly position infant at breast. Intervention(s): Breastfeeding basics reviewed;Support Pillows;Position options;Skin to skin  LATCH Score: 9  Lactation Tools Discussed/Used     Consult Status Consult Status: Follow-up Date: 03/15/15 Follow-up type: In-patient    Pamelia HoitWeeks, Keegen Heffern D 03/14/2015, 2:05 PM

## 2015-03-14 NOTE — MAU Note (Signed)
PT  ARRIVES  VIA  EMS.   HAS RETURNED  SAYING  PAIN IS  WORSE  WITH  BLOODY  SHOW.

## 2015-03-15 NOTE — Progress Notes (Signed)
Post Partum Day #1 Subjective:  History provided by patient in [place]. Declined language line.  no complaints, up ad lib, voiding, tolerating PO and + flatus, no BM  Objective: Blood pressure 97/48, pulse 69, temperature 98.3 F (36.8 C), temperature source Oral, resp. rate 17, height 5' 5.5" (1.664 m), weight 122.018 kg (269 lb), last menstrual period 07/13/2014, SpO2 100 %, unknown if currently breastfeeding.  Physical Exam:  General: alert and cooperative, well-appearing, comfortable, NAD Lochia: appropriate Uterine Fundus: firm, U -1 DVT Evaluation: No evidence of DVT seen on physical exam. Negative Homan's sign. No cords or calf tenderness. No significant calf/ankle edema.   Recent Labs  03/14/15 0533  HGB 15.4*  HCT 44.1    Assessment/Plan: Priscilla Delgado is a 34 y.o. G3P3003 at 6079w1d s/p NSVD healthy baby boy on 5/20 at 0824 - Postpartum course uncomplicated. Maternal GBS POSITIVE (inadequate prophylaxis - d/t adv labor received Ampicillin 2g x 1, total < 2.5 hrs) - Pain controlled on ibuprofen and percocet x 1 dose, bowel regimen PRN - Continue breast / bottle feeding, lactation consult as needed - Contraception: abstinence (husband not available) - Anticipate discharge tomorrow 5/22 (baby will req min 48 hr obs for inadequate GBS prophylaxis)    LOS: 1 day   Priscilla PilarAlexander Karamalegos, DO [facility], PGY-2  03/15/2015, 9:03 AM   Seen also by me Agree with note Aviva SignsMarie L Charlee Squibb, CNM

## 2015-03-15 NOTE — Lactation Note (Signed)
This note was copied from the chart of Priscilla Subrina Gaeta. Lactation Consultation Note  Follow up visit made with interpreter present.  Baby is currently on breast but needs to be closer.  Mom c/o nipple pain and nipple pinched when baby came off.  Instructed on and practiced techniques for deeper latch.  Baby latches easily and mom states it is more comfortable.  Comfort gels given with instructions.  Instructed to feed with any feeding cue and to call for assist prn.  Patient Name: Priscilla Delgado'Delgado Date: 03/15/2015 Reason for consult: Follow-up assessment   Maternal Data    Feeding Feeding Type: Breast Fed Length of feed: 20 min  LATCH Score/Interventions Latch: Grasps breast easily, tongue down, lips flanged, rhythmical sucking.  Audible Swallowing: Spontaneous and intermittent  Type of Nipple: Everted at rest and after stimulation  Comfort (Breast/Nipple): Filling, red/small blisters or bruises, mild/mod discomfort  Problem noted: Mild/Moderate discomfort  Hold (Positioning): Assistance needed to correctly position infant at breast and maintain latch. Intervention(Delgado): Breastfeeding basics reviewed;Support Pillows;Position options;Skin to skin  LATCH Score: 8  Lactation Tools Discussed/Used Tools: Comfort gels   Consult Status Consult Status: Follow-up Date: 03/16/15 Follow-up type: In-patient    Priscilla Delgado, Priscilla Delgado 03/15/2015, 11:38 AM

## 2015-03-15 NOTE — Discharge Summary (Signed)
Obstetric Discharge Summary Reason for Admission: onset of labor, TOLAC Prenatal Procedures: NST and ultrasound Intrapartum Procedures: spontaneous vaginal delivery and GBS prophylaxis Postpartum Procedures: none Complications-Operative and Postpartum: none HEMOGLOBIN  Date Value Ref Range Status  03/14/2015 15.4* 12.0 - 15.0 g/dL Final   HCT  Date Value Ref Range Status  03/14/2015 44.1 36.0 - 46.0 % Final    Discharge Diagnoses: Term Pregnancy-delivered, [facility]  Hospital Course:  Priscilla Delgado is a 34 y.o. G3P3003 at [redacted]w[redacted]d who was admitted on 03/14/15 for SOL (TOLAC) at term. Pregnancy was uncomplicated. Maternal GBS positive, received < 4 hr inadequate prophylaxis with Ampicillin (d/t adv labor). Progressed to vaginal delivery quickly within 3-4 hours (see copied delivery note below). Postpartum course was uncomplicated, tolerating PO and ambulation, pain controlled, bleeding improved, +flatus/BM, and no barriers to discharge. Plan for continue breast / bottle feeding, followed by lactation consultants, identified large Right breast firm irregular tissue consistent with clogged milk ducts given diffuse nature and difficulty with milk production on R-side, to continue warm compresses, breastfeeding, and pumping, advised warning signs of infection and when to return, contraception with abstinence (husband not available), follow-up in 4-6 weeks for postpartum visit.  Delivery Note At 8:24 AM a viable female was delivered via (Presentation: OA). APGAR: 9, 10; weight 6 lb 8.9oz.  Placenta status: Intact, Spontaneous. Cord: 3v with the following complications:  Anesthesia: none Episiotomy: none Lacerations: none Suture Repair: none Est. Blood Loss (mL): Est 300cc  Mom to postpartum. Baby to Couplet care / Skin to Skin.  Upon arrival patient was complete and pushing, after progressing quickly, with SROM at 0800. She pushed with good maternal effort, FHT decreased HR to 90s, placed  on oxygen with improvement, continued pushing to deliver a healthy boy. Baby delivered without difficulty, loose nuchal cord easily reduced on delivery, was noted to have good tone and place on maternal abdomen for oral suctioning, drying and stimulation. Delayed cord clamping performed and cut. Placenta delivered intact with 3V cord. Vaginal canal and perineum was inspected and intact. Pitocin was started and uterus massaged until bleeding slowed. Counts of sharps, instruments, and lap pads were all correct.   [name], DO [facility], PGY-2 03/14/2015, 8:35 AM  I was present for the delivery and agree the above assessment. CRESENZO-DISHMAN,FRANCES 03/15/2015 11:07 AM   Physical Exam:  General: alert and cooperative, well-appearing, NAD Breast: Right breast 9-12 o'clock mid/central tissue above nipple with firm irregular mildly tender tissue and scattered across lower breast as well, most consistent with clogged milk ducts and not circumscribed mass, no erythema, or skin changes. Still producing milk but reduced Lochia: appropriate Uterine Fundus: firm DVT Evaluation: No evidence of DVT seen on physical exam. Negative Homan's sign. No cords or calf tenderness. No significant calf/ankle edema.  Discharge Information: Date: 03/16/2015 Activity: pelvic rest Diet: routine Medications: PNV, Ibuprofen and Colace Baby feeding: plans to breastfeed, bottle supplement Contraception: abstinence Condition: stable Instructions: refer to practice specific booklet Discharge to: home   Newborn Data: Live born female  Birth Weight: 6 lb 8.9 oz (2975 g) APGAR: 9, 9  Anticipated discharge home with mother.  [name], DO [facility], PGY-2 03/16/2015, 6:51 AM   I spoke with and examined patient and agree with resident/PA/SNM's note and plan of care.  To get in warm shower this am and let water run on Rt breast as she massages it,  nurse, then pump to see if can get ducts unclogged.  Priscilla Delgado.  Cedric FishmanBooker, CNM, [facility]NP-BC 03/16/2015 7:16 AM

## 2015-03-16 MED ORDER — IBUPROFEN 600 MG PO TABS: 600.0000 mg | ORAL_TABLET | Freq: Four times a day (QID) | ORAL | Status: DC

## 2015-03-16 NOTE — Discharge Instructions (Signed)
You have clogged milk ducts on Right breast. Keep breastfeeding. Take long warm shower and try to massage right breast. Encourage to breast pump on Right side after breastfeeding. May use warm compresses. - If you develop fever, breast redness, more pain, swelling, or discharge, please [facility]-up. May be infection.  Postpartum Care After Vaginal Delivery After you deliver your newborn (postpartum period), the usual stay in the hospital is 24-72 hours. If there were problems with your labor or delivery, or if you have other medical problems, you might be in the hospital longer.  While you are in the hospital, you will receive help and instructions on how to care for yourself and your newborn during the postpartum period.  While you are in the hospital:  Be sure to tell your nurses if you have pain or discomfort, as well as where you feel the pain and what makes the pain worse.  If you had an incision made near your vagina (episiotomy) or if you had some tearing during delivery, the nurses may put ice packs on your episiotomy or tear. The ice packs may help to reduce the pain and swelling.  If you are breastfeeding, you may feel uncomfortable contractions of your uterus for a couple of weeks. This is normal. The contractions help your uterus get back to normal size.  It is normal to have some bleeding after delivery.  For the first 1-3 days after delivery, the flow is red and the amount may be similar to a period.  It is common for the flow to start and stop.  In the first few days, you may pass some small clots. Let your nurses know if you begin to pass large clots or your flow increases.  Do not  flush blood clots down the toilet before having the nurse look at them.  During the next 3-10 days after delivery, your flow should become more watery and pink or brown-tinged in color.  Ten to fourteen days after delivery, your flow should be a small amount of yellowish-white  discharge.  The amount of your flow will decrease over the first few weeks after delivery. Your flow may stop in 6-8 weeks. Most women have had their flow stop by 12 weeks after delivery.  You should change your sanitary pads frequently.  Wash your hands thoroughly with soap and water for at least 20 seconds after changing pads, using the toilet, or before holding or feeding your newborn.  You should feel like you need to empty your bladder within the first 6-8 hours after delivery.  In case you become weak, lightheaded, or faint, call your nurse before you get out of bed for the first time and before you take a shower for the first time.  Within the first few days after delivery, your breasts may begin to feel tender and full. This is called engorgement. Breast tenderness usually goes away within 48-72 hours after engorgement occurs. You may also notice milk leaking from your breasts. If you are not breastfeeding, do not stimulate your breasts. Breast stimulation can make your breasts produce more milk.  Spending as much time as possible with your newborn is very important. During this time, you and your newborn can feel close and get to know each other. Having your newborn stay in your room (rooming in) will help to strengthen the bond with your newborn. It will give you time to get to know your newborn and become comfortable caring for your newborn.  Your hormones change after delivery. Sometimes the hormone changes can temporarily cause you to feel sad or tearful. These feelings should not last more than a few days. If these feelings last longer than that, you should talk to your caregiver.  If desired, talk to your caregiver about methods of family planning or contraception.  Talk to your caregiver about immunizations. Your caregiver may want you to have the following immunizations before leaving the hospital:  Tetanus, diphtheria, and pertussis (Tdap) or tetanus and diphtheria (Td)  immunization. It is very important that you and your family (including grandparents) or others caring for your newborn are up-to-date with the Tdap or Td immunizations. The Tdap or Td immunization can help protect your newborn from getting ill.  Rubella immunization.  Varicella (chickenpox) immunization.  Influenza immunization. You should receive this annual immunization if you did not receive the immunization during your pregnancy. Document Released: 08/08/2007 Document Revised: 07/05/2012 Document Reviewed: 06/07/2012 [facility] Patient Information 2015 [place], [place]. This information is not intended to replace advice given to you by your health care provider. Make sure you discuss any questions you have with your health care provider.

## 2015-03-16 NOTE — Lactation Note (Signed)
This note was copied from the chart of Priscilla Delgado. Lactation Consultation Note  Follow up visit made prior to discharge.  Mom has an abundant supply and right breast is engorged.  Assisted with latch on right side and baby having trouble sustaining latch due to fullness.  Milk flowing from breast.  20 mm nipple shield used and baby latched easily and well.  Interpreter present for visit.  Demonstrated and assisted with good breast massage and compression during feeding.  Breast softer after feeding.  She does have about a 6 cm of firmness deep in upper breast on right.  With breast fullness it is difficult to assess if this is abnormal.  MD assessed breast this AM and is aware.  Mom is wearing comfort gels for nipple soreness.  She has used a manual pump to provide additional comfort on right side.  Ice pack given for engorgement treatment.  Patient Name: Priscilla Delgado Reason for consult: Follow-up assessment   Maternal Data    Feeding Feeding Type: Breast Fed Length of feed: 15 min  LATCH Score/Interventions Latch: Grasps breast easily, tongue down, lips flanged, rhythmical sucking. Intervention(s): Skin to skin;Teach feeding cues;Waking techniques  Audible Swallowing: Spontaneous and intermittent Intervention(s): Hand expression Intervention(s): Alternate breast massage;Hand expression  Type of Nipple: Everted at rest and after stimulation  Comfort (Breast/Nipple): Soft / non-tender  Problem noted: Mild/Moderate discomfort Interventions (Mild/moderate discomfort): Comfort gels  Hold (Positioning): Assistance needed to correctly position infant at breast and maintain latch. Intervention(s): Breastfeeding basics reviewed;Support Pillows;Position options;Skin to skin  LATCH Score: 9  Lactation Tools Discussed/Used     Consult Status Consult Status: Complete    Huston FoleyMOULDEN, Nagee Goates S Delgado, 11:38 AM

## 2015-03-17 ENCOUNTER — Ambulatory Visit (HOSPITAL_COMMUNITY): Admission: RE | Admit: 2015-03-17 | Payer: Medicaid Other | Source: Ambulatory Visit

## 2015-03-19 ENCOUNTER — Other Ambulatory Visit: Payer: Medicaid Other

## 2015-03-19 ENCOUNTER — Encounter: Payer: Self-pay | Admitting: Family

## 2015-03-19 ENCOUNTER — Other Ambulatory Visit: Payer: Self-pay | Admitting: Family

## 2015-03-19 DIAGNOSIS — O288 Other abnormal findings on antenatal screening of mother: Secondary | ICD-10-CM | POA: Insufficient documentation

## 2015-03-19 NOTE — Progress Notes (Signed)
Post discharge chart review completed.  

## 2015-03-28 ENCOUNTER — Telehealth: Payer: Self-pay | Admitting: *Deleted

## 2015-03-28 NOTE — Telephone Encounter (Signed)
Priscilla Delgado , [employer] nurse called and left a message Priscilla Delgado delivered 03/14/15 and she did home visit . States Priscilla Delgado showed her a bottle of macrodantin dated middle of May and states she wasn't taking it , but a few days previous was having burning and so she restarted taking it. Per Priscilla Delgado she states Priscilla Delgado states it isn't helping burning yet.  She encouraged her to drink lots of water.

## 2015-03-28 NOTE — Telephone Encounter (Addendum)
Called Priscilla Delgado with [employer] [phone] and informed her I am following up from a call we received from [employer] nurse that Darral Dashsther was having burning with urination and had restarted macrobid,  I asked her how she was doing today, she states since she started drinking lots of water the burning . I instructed her to finish taking all of the medicine and if that does not help the burning or pain by Monday or Tuesday to call us and come in to give a urine sample. I also reviewed her postpartum appointment with her.

## 2015-04-18 ENCOUNTER — Ambulatory Visit (INDEPENDENT_AMBULATORY_CARE_PROVIDER_SITE_OTHER): Payer: Medicaid Other | Admitting: Obstetrics & Gynecology

## 2015-04-18 ENCOUNTER — Encounter: Payer: Self-pay | Admitting: Obstetrics & Gynecology

## 2015-04-18 DIAGNOSIS — R001 Bradycardia, unspecified: Secondary | ICD-10-CM

## 2015-04-18 DIAGNOSIS — O9229 Other disorders of breast associated with pregnancy and the puerperium: Secondary | ICD-10-CM

## 2015-04-18 NOTE — Progress Notes (Signed)
Used Interpreter Redgie Grayer. Pt c/o right breast pain. Also c/o feeling heart beat fast sometimes. Also c/o still has swelling in her feet/legs. Does not desire birth control because husband is out of the husband.

## 2015-04-18 NOTE — Progress Notes (Deleted)
   Subjective:    Patient ID: Priscilla Delgado, female    DOB: 1981/07/24, 34 y.o.   MRN: [id number]  HPI    Review of Systems     Objective:   Physical Exam        Assessment & Plan:

## 2015-04-18 NOTE — Progress Notes (Signed)
Spoke with patient via interpreter.  Patient requests cream for nipple soreness.  States she has used this in the past and it has helped.  States she doesn't need to see the Advertising copywriter.  I wrote down Lanolin for the patient and told her to take it to her pharmacy.  Explained it would be over the counter that she didn't need a prescription.    Per Dr. Debroah Loop will also send referral to [facility].  Referral order placed in EPIC and referral paper form faxed to [facility].

## 2015-04-18 NOTE — Progress Notes (Signed)
Patient ID: Priscilla Delgado, female   DOB: 1981/08/20, 34 y.o.   MRN: [id number] Subjective:right nipple sore, nursing     Annalis Siekierski is a 34 y.o. female who presents for a postpartum visit. She is 5 weeks postpartum following a spontaneous vaginal delivery. I have fully reviewed the prenatal and intrapartum course. The delivery was at 38 gestational weeks. Outcome: spontaneous vaginal delivery. Anesthesia: none. Postpartum course has been good. Baby's course has been normal. Baby is feeding by both breast and bottle -  . Bleeding no bleeding. Bowel function is normal. Bladder function is normal. Patient is not sexually active. Contraception method is abstinence. Postpartum depression screening: negative.  The following portions of the patient's history were reviewed and updated as appropriate: allergies, current medications, past family history, past medical history, past social history, past surgical history and problem list.  Review of Systems Pertinent items are noted in HPI.   Objective:    There were no vitals taken for this visit.  General:  alert, cooperative and no distress   Breasts:  positive findings: smooth nodule located on the right subareolar  Lungs:    Heart:     Abdomen: soft, non-tender; bowel sounds normal; no masses,  no organomegaly   Vulva:  not evaluated  Vagina: not evaluated  Cervix:     Corpus: not examined  Adnexa:  not evaluated  Rectal Exam: Not performed.        Assessment:     5 week postpartum exam, right nipple sore with benign exam except palpable milk duct  On right Pap smear not done at today's visit.   Plan:    1. Contraception: abstinence 2. Lactation consult 3. Follow up  as needed.    Adam Phenix, MD 04/18/2015

## 2015-04-18 NOTE — Patient Instructions (Signed)
Breastfeeding and Inducing Lactation Induced lactation is using hormones or other medicines and breast stimulation to help you produce breast milk. You may want to try induced lactation if you:  Are adopting a baby.   Are having a surrogate mother carry your baby.   Have to stop breastfeeding for a period of time.  HOW DOES IT WORK? During pregnancy, your hormones change to prepare your body to produce breast milk. After pregnancy, hormones signal your body to start making breast milk to feed your baby. When you do not go through these changes, it may be hard for you to produce enough breast milk to feed your baby. Your health care provider and a lactation consultant can help you produce milk using medicine and breast stimulation.  WILL I MAKE ENOUGH MILK TO FEED MY BABY? Induced lactation may be successful. However, very few women who use induced lactation to produce breast milk can make all the milk their baby needs. You may need to feed your baby with donated breast milk or infant formula in addition to your breast milk. This will make sure your baby gets adequate nutrition. Induced lactation is usually more successful if you have been pregnant before.  HOW DOES MY BODY PRODUCE MILK? To induce lactation, you will start taking hormones 3-4 months before you want to start breastfeeding. You will continue to take them until about 6 weeks before the baby arrives. When you stop taking the hormones, you will need to perform breast stimulation a number of times per day to encourage breast milk production. Breast stimulation can be performed by gently rubbing and stretching the nipple tissue. Breast stimulation can also be performed using a double electric hospital-grade pump to mimic a baby's suckling at the breast. Try to pump every 3 hours (8 times a day) for 20 minutes on each breast. If you are unable to pump that many times each day, do it as often as possible. This helps your body continue to make  milk. When you put your baby to your breast, your body may also respond to the smell, sound, and feel of your baby by increasing the amount of milk you produce.  Your health care provider may also prescribe medicine to stimulate lactation and increase your milk supply. Herbal medicines are also available to try to induce lactation. It is important to know that these medicines are not approved or regulated by the FDA. Always check with your health care provider before using any herbal medicine. WHAT ELSE DO I NEED TO KNOW?  You should only take hormones and medicines as directed by your health care provider or trained lactation consultant.  Talk to your health care provider or lactation consultant if you need guidance. He or she may be able to help you start a milk supply and advise you as you make important decisions about nourishing your baby.  Try using a supplemental nursing system to provide extra donated breast milk or formula at the breast while the baby nurses. This ensures that your infant gets enough nutrition while you are breastfeeding. Ask a lactation specialist to help you find and use this device. Document Released: 10/11/2005 Document Revised: 10/16/2013 Document Reviewed: 08/03/2013 [employer] Patient Information 2015 [employer]. This information is not intended to replace advice given to you by your health care provider. Make sure you discuss any questions you have with your health care provider.  

## 2015-04-23 ENCOUNTER — Telehealth: Payer: Self-pay | Admitting: General Practice

## 2015-04-23 NOTE — Telephone Encounter (Signed)
Patient's congregational nurse called and left message stating that the patient is expecting a referral and a call from us about an appt and would like a call back with a update. [facility] and they are not accepting new patients until mid July. Called patient with pacific interpreter [phone] and provided that information to patient along with their name,address and phone number and instructed patient to contact them in a couple weeks. Patient verbalized understanding and requested same info be shared with congregational nurse. Told patient I would and she had no other questions. Called congregational nurse and updated her that Darral Dashsther would have to contact them in the next couple of weeks. She verbalized understanding and had no questions

## 2015-06-02 ENCOUNTER — Encounter (HOSPITAL_COMMUNITY): Payer: Self-pay

## 2015-06-02 ENCOUNTER — Emergency Department (HOSPITAL_COMMUNITY)
Admission: EM | Admit: 2015-06-02 | Discharge: 2015-06-02 | Disposition: A | Discharge status: Home / Self Care | Payer: Medicaid Other | Attending: Emergency Medicine | Admitting: Emergency Medicine

## 2015-06-02 DIAGNOSIS — N644 Mastodynia: Secondary | ICD-10-CM | POA: Diagnosis present

## 2015-06-02 DIAGNOSIS — N61 Inflammatory disorders of breast: Secondary | ICD-10-CM | POA: Insufficient documentation

## 2015-06-02 DIAGNOSIS — Z8719 Personal history of other diseases of the digestive system: Secondary | ICD-10-CM | POA: Diagnosis not present

## 2015-06-02 MED ORDER — AMOXICILLIN-POT CLAVULANATE 875-125 MG PO TABS: 1.0000 | ORAL_TABLET | Freq: Two times a day (BID) | ORAL | Status: DC

## 2015-06-02 MED ORDER — IBUPROFEN 800 MG PO TABS: 800.0000 mg | ORAL_TABLET | Freq: Three times a day (TID) | ORAL | Status: DC | PRN

## 2015-06-02 MED ORDER — MUPIROCIN 2 % EX OINT: 1.0000 "application " | TOPICAL_OINTMENT | Freq: Two times a day (BID) | CUTANEOUS | Status: DC

## 2015-06-02 NOTE — ED Provider Notes (Signed)
CSN: [id number]     Arrival date & time 06/02/15  2148 History  This chart was scribed for Charlestine Night, PA-C, working with Blake Divine, MD by Chestine Spore, ED Scribe. The patient was seen in room WTR7/WTR7 at 10:57 PM.    Chief Complaint  Patient presents with  . Breast Pain      The history is provided by the patient. No language interpreter was used.    HPI Comments: Priscilla Delgado is a 34 y.o. female who presents to the Emergency Department complaining of severe right breast pain onset 2 months. Pt has had pain in both breasts and she has been using her left breast to breast fed. Pt notes that she has an open sore on her left breast due to overuse. Pt reports that there is no issue with feeding her child even though she has a sore on her left nipple. Pt notes that she has a burning sensation to her left breast. She states that she is having associated symptoms of wound to the left nipple. She denies color change, rash, and any other symptoms.   Past Medical History  Diagnosis Date  . Ulcer of the stomach and intestine    Past Surgical History  Procedure Laterality Date  . Cesarean section     History reviewed. No pertinent family history. History  Substance Use Topics  . Smoking status: Never Smoker   . Smokeless tobacco: Never Used  . Alcohol Use: No   OB History    Gravida Para Term Preterm AB TAB SAB Ectopic Multiple Living   3 3 3  0 0 0 0 0 0 3      Obstetric Comments   C/s for breech- leg was coming down     Review of Systems  Constitutional: Negative for fever and chills.  Skin: Positive for wound (sore to left nipple). Negative for color change and rash.      Allergies  Review of patient's allergies indicates no known allergies.  Home Medications   Prior to Admission medications   Medication Sig Start Date End Date Taking? Authorizing Provider  ibuprofen (ADVIL,MOTRIN) 600 MG tablet Take 1 tablet (600 mg total) by mouth every 6 (six) hours.  03/16/15  Yes Smitty Cords, DO  Prenatal Vit-Fe Fumarate-FA (PRENATAL VITAMINS PLUS) 27-1 MG TABS Take 1 tablet by mouth daily. Patient not taking: Reported on 06/02/2015 12/19/14   Fredirick Lathe, MD   BP 104/59 mmHg  Pulse 72  Temp(Src) 98.1 F (36.7 C) (Oral)  Resp 20  SpO2 100% Physical Exam  Constitutional: She is oriented to person, place, and time. She appears well-developed and well-nourished. No distress.  HENT:  Head: Normocephalic and atraumatic.  Eyes: EOM are normal.  Neck: Neck supple. No tracheal deviation present.  Cardiovascular: Normal rate.   Pulmonary/Chest: Effort normal. No respiratory distress. She exhibits no mass. Right breast exhibits tenderness. Right breast exhibits no inverted nipple and no mass. Left breast exhibits tenderness. Left breast exhibits no inverted nipple and no mass.  Left breast: No palpable masses and diffuse tenderness. Excoriation of left nipple.  Musculoskeletal: Normal range of motion.  Neurological: She is alert and oriented to person, place, and time.  Skin: Skin is warm and dry.  Psychiatric: She has a normal mood and affect. Her behavior is normal.  Nursing note and vitals reviewed.   ED Course  Procedures (including critical care time) DIAGNOSTIC STUDIES: Oxygen Saturation is 100% on RA, nl by my interpretation.    COORDINATION  OF CARE: 10:59 PM Discussed treatment plan with pt at bedside and pt agreed to plan.   The patient will be treated for mastitis. She will be advised to follow up with her OB. Told to return here as needed.   Charlestine Night, PA-C 06/02/15 2313  Blake Divine, MD 06/05/15 [phone]

## 2015-06-02 NOTE — ED Notes (Signed)
Pt complains of right breast pain for two months and left breast pain for two days, had a baby two months ago and is unable to breast feed because of the pain

## 2015-06-02 NOTE — Discharge Instructions (Signed)
Return here as needed. Follow up with your doctor. Use warm compresses on the breast

## 2015-07-10 ENCOUNTER — Ambulatory Visit: Payer: Medicaid Other | Admitting: Family Medicine

## 2015-08-07 ENCOUNTER — Ambulatory Visit: Payer: Self-pay | Admitting: Family Medicine

## 2015-08-14 ENCOUNTER — Ambulatory Visit: Payer: Self-pay | Admitting: Family Medicine

## 2015-08-22 ENCOUNTER — Ambulatory Visit: Payer: Self-pay | Admitting: Family Medicine

## 2015-09-17 ENCOUNTER — Emergency Department (HOSPITAL_COMMUNITY)
Admission: EM | Admit: 2015-09-17 | Discharge: 2015-09-17 | Disposition: A | Discharge status: Home / Self Care | Payer: Medicaid Other | Attending: Emergency Medicine | Admitting: Emergency Medicine

## 2015-09-17 ENCOUNTER — Encounter (HOSPITAL_COMMUNITY): Payer: Self-pay | Admitting: Emergency Medicine

## 2015-09-17 DIAGNOSIS — Z3202 Encounter for pregnancy test, result negative: Secondary | ICD-10-CM | POA: Diagnosis not present

## 2015-09-17 DIAGNOSIS — Z8719 Personal history of other diseases of the digestive system: Secondary | ICD-10-CM | POA: Diagnosis not present

## 2015-09-17 DIAGNOSIS — R1013 Epigastric pain: Secondary | ICD-10-CM | POA: Diagnosis not present

## 2015-09-17 DIAGNOSIS — K5900 Constipation, unspecified: Secondary | ICD-10-CM | POA: Diagnosis present

## 2015-09-17 LAB — URINALYSIS, ROUTINE W REFLEX MICROSCOPIC
Bilirubin Urine: NEGATIVE
Glucose, UA: NEGATIVE mg/dL
HGB URINE DIPSTICK: NEGATIVE
KETONES UR: NEGATIVE mg/dL
Leukocytes, UA: NEGATIVE
Nitrite: NEGATIVE
Protein, ur: NEGATIVE mg/dL
Specific Gravity, Urine: 1.02 (ref 1.005–1.030)
pH: 7.5 (ref 5.0–8.0)

## 2015-09-17 LAB — COMPREHENSIVE METABOLIC PANEL
ALT: 16 U/L (ref 14–54)
AST: 18 U/L (ref 15–41)
Albumin: 3.7 g/dL (ref 3.5–5.0)
Alkaline Phosphatase: 84 U/L (ref 38–126)
Anion gap: 5 (ref 5–15)
BUN: 6 mg/dL (ref 6–20)
CHLORIDE: 106 mmol/L (ref 101–111)
CO2: 30 mmol/L (ref 22–32)
CREATININE: 1.04 mg/dL — AB (ref 0.44–1.00)
Calcium: 9.1 mg/dL (ref 8.9–10.3)
Glucose, Bld: 96 mg/dL (ref 65–99)
Potassium: 3.9 mmol/L (ref 3.5–5.1)
Sodium: 141 mmol/L (ref 135–145)
Total Bilirubin: 0.6 mg/dL (ref 0.3–1.2)
Total Protein: 6.6 g/dL (ref 6.5–8.1)

## 2015-09-17 LAB — CBC
HCT: 37 % (ref 36.0–46.0)
Hemoglobin: 12 g/dL (ref 12.0–15.0)
MCH: 29.2 pg (ref 26.0–34.0)
MCHC: 32.4 g/dL (ref 30.0–36.0)
MCV: 90 fL (ref 78.0–100.0)
Platelets: 241 10*3/uL (ref 150–400)
RBC: 4.11 MIL/uL (ref 3.87–5.11)
RDW: 12.4 % (ref 11.5–15.5)
WBC: 4.8 10*3/uL (ref 4.0–10.5)

## 2015-09-17 LAB — PREGNANCY, URINE: PREG TEST UR: NEGATIVE

## 2015-09-17 LAB — LIPASE, BLOOD: LIPASE: 22 U/L (ref 11–51)

## 2015-09-17 MED ORDER — ALUM & MAG HYDROXIDE-SIMETH 200-200-20 MG/5ML PO SUSP
15.0000 mL | Freq: Once | ORAL | Status: AC
  Administered 2015-09-17: 15 mL via ORAL
  Filled 2015-09-17: qty 30

## 2015-09-17 MED ORDER — RANITIDINE HCL 150 MG/10ML PO SYRP
150.0000 mg | ORAL_SOLUTION | Freq: Once | ORAL | Status: AC
  Administered 2015-09-17: 150 mg via ORAL
  Filled 2015-09-17: qty 10

## 2015-09-17 MED ORDER — RANITIDINE HCL 150 MG PO TABS: 150.0000 mg | ORAL_TABLET | Freq: Two times a day (BID) | ORAL | Status: DC

## 2015-09-17 MED ORDER — PANTOPRAZOLE SODIUM 20 MG PO TBEC: 20.0000 mg | DELAYED_RELEASE_TABLET | Freq: Every day | ORAL | Status: DC

## 2015-09-17 MED ORDER — LIDOCAINE VISCOUS 2 % MT SOLN
15.0000 mL | Freq: Once | OROMUCOSAL | Status: AC
  Administered 2015-09-17: 15 mL via OROMUCOSAL
  Filled 2015-09-17: qty 15

## 2015-09-17 NOTE — ED Provider Notes (Signed)
CSN: [id number]     Arrival date & time 09/17/15  2034 History   First MD Initiated Contact with Patient 09/17/15 2111     Chief Complaint  Patient presents with  . Bloated  . Constipation    Patient is a 34 y.o. female presenting with abdominal pain. The history is provided by the patient.  Abdominal Pain Pain location:  Epigastric Pain quality: bloating   Pain radiates to:  Does not radiate Pain severity:  Mild Onset quality:  Gradual Duration:  1 week Timing:  Constant Chronicity:  New Context: eating   Relieved by:  Nothing Associated symptoms: belching and constipation   Associated symptoms: no chest pain, no dysuria, no fever, no hematuria, no nausea, no shortness of breath, no sore throat and no vomiting     Past Medical History  Diagnosis Date  . Ulcer of the stomach and intestine    Past Surgical History  Procedure Laterality Date  . Cesarean section     No family history on file. Social History  Substance Use Topics  . Smoking status: Never Smoker   . Smokeless tobacco: Never Used  . Alcohol Use: No   OB History    Gravida Para Term Preterm AB TAB SAB Ectopic Multiple Living   3 3 3  0 0 0 0 0 0 3      Obstetric Comments   C/s for breech- leg was coming down     Review of Systems  Constitutional: Negative for fever.  HENT: Negative for rhinorrhea and sore throat.   Eyes: Negative for visual disturbance.  Respiratory: Negative for chest tightness and shortness of breath.   Cardiovascular: Negative for chest pain and palpitations.  Gastrointestinal: Positive for abdominal pain and constipation. Negative for nausea and vomiting.  Genitourinary: Negative for dysuria and hematuria.  Musculoskeletal: Negative for back pain and neck pain.  Skin: Negative for rash.  Neurological: Negative for dizziness and headaches.  Psychiatric/Behavioral: Negative for confusion.  All other systems reviewed and are negative.  Allergies  Review of patient's allergies  indicates no known allergies.  Home Medications   Prior to Admission medications   Medication Sig Start Date End Date Taking? Authorizing Provider  amoxicillin-clavulanate (AUGMENTIN) 875-125 MG per tablet Take 1 tablet by mouth every 12 (twelve) hours. Patient not taking: Reported on 09/17/2015 06/02/15   Charlestine Nighthristopher Lawyer, PA-C  ibuprofen (ADVIL,MOTRIN) 800 MG tablet Take 1 tablet (800 mg total) by mouth every 8 (eight) hours as needed. Patient not taking: Reported on 09/17/2015 06/02/15   Charlestine Nighthristopher Lawyer, PA-C  mupirocin ointment (BACTROBAN) 2 % Place 1 application into the nose 2 (two) times daily. Patient not taking: Reported on 09/17/2015 06/02/15   Charlestine Nighthristopher Lawyer, PA-C  pantoprazole (PROTONIX) 20 MG tablet Take 1 tablet (20 mg total) by mouth daily. 09/17/15   Maris BergerJonah Sherral Dirocco, MD  Prenatal Vit-Fe Fumarate-FA (PRENATAL VITAMINS PLUS) 27-1 MG TABS Take 1 tablet by mouth daily. Patient not taking: Reported on 06/02/2015 12/19/14   Fredirick LatheKristy Acosta, MD  ranitidine (ZANTAC) 150 MG tablet Take 1 tablet (150 mg total) by mouth 2 (two) times daily. 09/17/15   Maris BergerJonah Celestina Gironda, MD   BP 106/62 mmHg  Pulse 59  Temp(Src) 98.2 F (36.8 C) (Oral)  Resp 15  Ht 5\' 5"  (1.651 m)  Wt 118.842 kg  BMI 43.60 kg/m2  SpO2 100% Physical Exam  Constitutional: She is oriented to person, place, and time. She appears well-developed and well-nourished. No distress.  HENT:  Head: Normocephalic and atraumatic.  Mouth/Throat: Oropharynx is clear and moist.  Eyes: EOM are normal. Pupils are equal, round, and reactive to light.  Neck: Neck supple. No JVD present.  Cardiovascular: Normal rate, regular rhythm, normal heart sounds and intact distal pulses.  Exam reveals no gallop.   No murmur heard. Pulmonary/Chest: Effort normal and breath sounds normal. She has no wheezes. She has no rales.  Abdominal: Soft. She exhibits no distension. There is tenderness (mild with deep palpation) in the epigastric area. There is no  rigidity, no rebound and no guarding.  Old vertical c section scar  Musculoskeletal: Normal range of motion. She exhibits no tenderness.  Neurological: She is alert and oriented to person, place, and time. No cranial nerve deficit. She exhibits normal muscle tone.  Skin: Skin is warm and dry. No rash noted.  Psychiatric: Her behavior is normal.    ED Course  Procedures  None   Labs Review Labs Reviewed  COMPREHENSIVE METABOLIC PANEL - Abnormal; Notable for the following:    Creatinine, Ser 1.04 (*)    All other components within normal limits  LIPASE, BLOOD  CBC  URINALYSIS, ROUTINE W REFLEX MICROSCOPIC (NOT AT [facility])  PREGNANCY, URINE   I have personally reviewed and evaluated these images and lab results as part of my medical decision-making.   EKG Interpretation   Date/Time:  Wednesday September 17 2015 21:43:07 EST Ventricular Rate:  57 PR Interval:  137 QRS Duration: 97 QT Interval:  415 QTC Calculation: 404 R Axis:   74 Text Interpretation:  Sinus rhythm No previous tracing Confirmed by BEATON   MD, ROBERT ([id number]) on 09/17/2015 10:16:49 PM      MDM   Final diagnoses:  Dyspepsia    Obese AAF with a PMH of  "stomach ulcers" - unmedicated - presents with epigastric discomfort. No N/V/D or fever. Benign exam. AF, VSS. Labs and urine unremarkable. EKG with sinus brady, BER, no acute ischemic changes, intervals maintained, no wellens or brugada, no delta wave. GI cocktail given. Symptoms improving. Will Rx zantac and protonix. Doubt cardiac origin. Discussed outpatient management - pt agreeable with plan.  Discussed with DR. Radford Pax.  Maris Berger, MD 09/17/15 1610  Nelva Nay, MD 09/17/15 [phone]

## 2015-09-17 NOTE — ED Notes (Signed)
Pt states that she has not been able to have a normal bowel movement in 3 days and every time she eats she feels like her abdomen swells up and she feels bloated. Pt states she feels like she has to have a BM but unable to. Pt also states she has a hx of stomach ulcers. Pt denies any pain at this time.

## 2015-09-22 ENCOUNTER — Ambulatory Visit: Payer: Self-pay | Admitting: Family Medicine

## 2015-10-09 ENCOUNTER — Ambulatory Visit (INDEPENDENT_AMBULATORY_CARE_PROVIDER_SITE_OTHER): Payer: Medicaid Other | Admitting: Family Medicine

## 2015-10-09 ENCOUNTER — Encounter: Payer: Self-pay | Admitting: Family Medicine

## 2015-10-09 ENCOUNTER — Ambulatory Visit: Payer: Self-pay | Admitting: Family Medicine

## 2015-10-09 VITALS — BP 106/58 | HR 60 | Temp 97.4°F | Ht 65.5 in | Wt 266.0 lb

## 2015-10-09 DIAGNOSIS — Z7689 Persons encountering health services in other specified circumstances: Secondary | ICD-10-CM

## 2015-10-09 DIAGNOSIS — K219 Gastro-esophageal reflux disease without esophagitis: Secondary | ICD-10-CM | POA: Diagnosis not present

## 2015-10-09 DIAGNOSIS — K5900 Constipation, unspecified: Secondary | ICD-10-CM | POA: Diagnosis not present

## 2015-10-09 DIAGNOSIS — Z7189 Other specified counseling: Secondary | ICD-10-CM | POA: Diagnosis not present

## 2015-10-09 DIAGNOSIS — R140 Abdominal distension (gaseous): Secondary | ICD-10-CM

## 2015-10-09 MED ORDER — PANTOPRAZOLE SODIUM 20 MG PO TBEC: 20.0000 mg | DELAYED_RELEASE_TABLET | Freq: Every day | ORAL | Status: DC

## 2015-10-09 MED ORDER — DOCUSATE SODIUM 100 MG PO CAPS: 100.0000 mg | ORAL_CAPSULE | Freq: Two times a day (BID) | ORAL | Status: DC

## 2015-10-09 MED ORDER — OMEPRAZOLE 20 MG PO CPDR: 20.0000 mg | DELAYED_RELEASE_CAPSULE | Freq: Every day | ORAL | Status: DC

## 2015-10-09 NOTE — Progress Notes (Signed)
Patient ID: Priscilla Delgado, female   DOB: 09-Sep-1981, 34 y.o.   MRN: [id number]   Priscilla Delgado, is a 34 y.o. female  WUJ:[id number]  [id number]  DOB - 1981/06/18  CC:  Chief Complaint  Patient presents with  . New Evaluation  . GI Problem  . Constipation  . Anxiety       HPI: Priscilla Delgado is a 34 y.o. female here to establish care. Information obtained with difficulty with the help of an interpreter. Her main issue is GI symptoms.  She complains of upper abdominal pain. She was seen in ED recently and treated with Protonix. Her upper GI symptoms have improved but she is still experiencing constipation, bloating, and gas. She reports that someones she will have BMS daily and sometime only once a week. She reports sometimes taking 2 hours to have a BM. It is unclear where this is due to large hard stools. I am unable to obtain adequate information about her diet.  Her concern is that someone mentioned an ulcer and she is afraid she might have one.  Otherwise she states she is in good health. She does have a history of a C-section.  No Known Allergies Past Medical History  Diagnosis Date  . Ulcer of the stomach and intestine    No current outpatient prescriptions on file prior to visit.   No current facility-administered medications on file prior to visit.   History reviewed. No pertinent family history. Social History   Social History  . Marital Status: Married    Spouse Name: N/A  . Number of Children: N/A  . Years of Education: N/A   Occupational History  . Not on file.   Social History Main Topics  . Smoking status: Never Smoker   . Smokeless tobacco: Never Used  . Alcohol Use: No  . Drug Use: No  . Sexual Activity: Not Currently   Other Topics Concern  . Not on file   Social History Narrative    Review of Systems: Constitutional: Negative for fever, chills, appetite change, weight loss,  Fatigue.  Reports weight gain. Skin: Negative for rashes or lesions  of concern. HENT: Negative for ear pain, ear discharge.nose bleeds Eyes: Negative for pain, discharge, redness, and visual disturbance.reports some eye itching.  Neck: Negative for pain, stiffness Respiratory: Negative for cough, shortness of breath,   Cardiovascular: Negative for chest pain,and leg swelling.Reports occassional palpitations. Gastrointestinal: Negative for abdominal pain, nausea, vomiting, diarrhea, Positive for epigastric discomfort and heartburn. She also reports lower abd discomfort with constipation, bloating and gassiness. Genitourinary: Negative for dysuria, urgency, frequency, hematuria,  Musculoskeletal: Negative for  joint pain, joint  swelling, and gait problem.Negative for weakness.Positive for some back and shoulder pain. Neurological: Negative for dizziness, tremors, seizures, syncope,   light-headedness, numbness and headaches.  Hematological: Negative for easy bruising or bleeding Psychiatric/Behavioral: Negative for depression, anxiety, decreased concentration, confusion GYN: Reports menstural cramps. Reports is breast feeding/   Objective:   Filed Vitals:   10/09/15 0934  BP: 106/58  Pulse: 60  Temp: 97.4 F (36.3 C)    Physical Exam: Constitutional: Patient appears well-developed and well-nourished. No distress. HENT: Normocephalic, atraumatic, External right and left ear normal. Oropharynx is clear and moist.  Eyes: Conjunctivae and EOM are normal. PERRLA, no scleral icterus. Neck: Normal ROM. Neck supple. No lymphadenopathy, No thyromegaly. CVS: RRR, S1/S2 +, no murmurs, no gallops, no rubs Pulmonary: Effort and breath sounds normal, no stridor, rhonchi, wheezes, rales.  Abdominal: Soft. Normoactive BS,, no distension,  tenderness, rebound or guarding.  Musculoskeletal: Normal range of motion. No edema and no tenderness.  Neuro: Alert.Normal muscle tone coordination. Non-focal Skin: Skin is warm and dry. No rash noted. Not diaphoretic. No  erythema. No pallor. Psychiatric: Normal mood and affect. Behavior, judgment, thought content normal.  Lab Results  Component Value Date   WBC 4.8 09/17/2015   HGB 12.0 09/17/2015   HCT 37.0 09/17/2015   MCV 90.0 09/17/2015   PLT 241 09/17/2015   Lab Results  Component Value Date   CREATININE 1.04* 09/17/2015   BUN 6 09/17/2015   NA 141 09/17/2015   K 3.9 09/17/2015   CL 106 09/17/2015   CO2 30 09/17/2015    No results found for: HGBA1C Lipid Panel  No results found for: CHOL, TRIG, HDL, CHOLHDL, VLDL, LDLCALC     Assessment and plan:   1. Constipation, unspecified constipation type  - docusate sodium (COLACE) 100 MG capsule; Take 1 capsule (100 mg total) by mouth 2 (two) times daily.  Dispense: 10 capsule; Refill: 0  2. Bloating - Have discussed change of diet to attempt to control GI symptoms. -Have provided printed materials.  3. Encounter to establish care - With help of interpreter I have reviewed information provided by the patient and information from her [facility] chart.  4. Gastroesophageal reflux disease, esophagitis presence not specified  - omeprazole (PRILOSEC) 20 MG capsule; Take 1 capsule (20 mg total) by mouth daily.  Dispense: 90 capsule; Refill: 1   Return in about 1 month (around 11/09/2015).  The patient was given clear instructions to go to ER or return to medical center if symptoms don't improve, worsen or new problems develop. The patient verbalized understanding.    Henrietta HooverLinda C Chandlar Guice FNP  10/09/2015, 10:49 AM

## 2015-10-09 NOTE — Patient Instructions (Addendum)
Constipation, Adult Constipation is when a person:  Poops (has a bowel movement) less than 3 times a week.  Has a hard time pooping.  Has poop that is dry, hard, or bigger than normal. HOME CARE   Eat foods with a lot of fiber in them. This includes fruits, vegetables, beans, and whole grains such as brown rice.  Avoid fatty foods and foods with a lot of sugar. This includes french fries, hamburgers, cookies, candy, and soda.  If you are not getting enough fiber from food, take products with added fiber in them (supplements).  Drink enough fluid to keep your pee (urine) clear or pale yellow.  Exercise on a regular basis, or as told by your doctor.  Go to the restroom when you feel like you need to poop. Do not hold it.  Only take medicine as told by your doctor. Do not take medicines that help you poop (laxatives) without talking to your doctor first. GET HELP RIGHT AWAY IF:   You have bright red blood in your poop (stool).  Your constipation lasts more than 4 days or gets worse.  You have belly (abdominal) or butt (rectal) pain.  You have thin poop (as thin as a pencil).  You lose weight, and it cannot be explained. MAKE SURE YOU:   Understand these instructions.  Will watch your condition.  Will get help right away if you are not doing well or get worse.   This information is not intended to replace advice given to you by your health care provider. Make sure you discuss any questions you have with your health care provider.   Document Released: 03/29/2008 Document Revised: 11/01/2014 Document Reviewed: 07/23/2013 Elsevier Interactive Patient Education 2016 [employer] Take  Protonix daily for acid reflux Take Colace for constipation. Eat lots of fruits and vegetables and drink lots of water. Exercise regularly Come back in one month.

## 2015-11-03 ENCOUNTER — Ambulatory Visit: Payer: Self-pay | Admitting: Family Medicine

## 2016-07-18 ENCOUNTER — Emergency Department (HOSPITAL_COMMUNITY)
Admission: EM | Admit: 2016-07-18 | Discharge: 2016-07-19 | Discharge status: Against Medical Advice | Payer: BLUE CROSS/BLUE SHIELD | Attending: Emergency Medicine | Admitting: Emergency Medicine

## 2016-07-18 ENCOUNTER — Encounter (HOSPITAL_COMMUNITY): Payer: Self-pay | Admitting: *Deleted

## 2016-07-18 DIAGNOSIS — R1031 Right lower quadrant pain: Secondary | ICD-10-CM | POA: Insufficient documentation

## 2016-07-18 LAB — CBC WITH DIFFERENTIAL/PLATELET
BASOS ABS: 0 10*3/uL (ref 0.0–0.1)
BASOS PCT: 0 %
EOS PCT: 1 %
Eosinophils Absolute: 0.1 10*3/uL (ref 0.0–0.7)
HEMATOCRIT: 35.6 % — AB (ref 36.0–46.0)
Hemoglobin: 11.3 g/dL — ABNORMAL LOW (ref 12.0–15.0)
LYMPHS PCT: 42 %
Lymphs Abs: 2.3 10*3/uL (ref 0.7–4.0)
MCH: 28.9 pg (ref 26.0–34.0)
MCHC: 31.7 g/dL (ref 30.0–36.0)
MCV: 91 fL (ref 78.0–100.0)
Monocytes Absolute: 0.3 10*3/uL (ref 0.1–1.0)
Monocytes Relative: 6 %
NEUTROS ABS: 2.8 10*3/uL (ref 1.7–7.7)
Neutrophils Relative %: 51 %
PLATELETS: 248 10*3/uL (ref 150–400)
RBC: 3.91 MIL/uL (ref 3.87–5.11)
RDW: 13 % (ref 11.5–15.5)
WBC: 5.5 10*3/uL (ref 4.0–10.5)

## 2016-07-18 LAB — COMPREHENSIVE METABOLIC PANEL
ALBUMIN: 3.8 g/dL (ref 3.5–5.0)
ALT: 12 U/L — AB (ref 14–54)
AST: 14 U/L — AB (ref 15–41)
Alkaline Phosphatase: 70 U/L (ref 38–126)
Anion gap: 5 (ref 5–15)
BUN: 6 mg/dL (ref 6–20)
CHLORIDE: 105 mmol/L (ref 101–111)
CO2: 26 mmol/L (ref 22–32)
CREATININE: 0.69 mg/dL (ref 0.44–1.00)
Calcium: 8.8 mg/dL — ABNORMAL LOW (ref 8.9–10.3)
GFR calc Af Amer: >60 mL/min (ref >60)
GLUCOSE: 97 mg/dL (ref 65–99)
Potassium: 3.7 mmol/L (ref 3.5–5.1)
Sodium: 136 mmol/L (ref 135–145)
Total Bilirubin: 0.2 mg/dL — ABNORMAL LOW (ref 0.3–1.2)
Total Protein: 6.7 g/dL (ref 6.5–8.1)

## 2016-07-18 LAB — URINALYSIS, ROUTINE W REFLEX MICROSCOPIC
Bilirubin Urine: NEGATIVE
Glucose, UA: NEGATIVE mg/dL
Hgb urine dipstick: NEGATIVE
Ketones, ur: NEGATIVE mg/dL
Leukocytes, UA: NEGATIVE
NITRITE: NEGATIVE
Protein, ur: NEGATIVE mg/dL
SPECIFIC GRAVITY, URINE: 1.01 (ref 1.005–1.030)
pH: 6 (ref 5.0–8.0)

## 2016-07-18 LAB — WET PREP, GENITAL
Clue Cells Wet Prep HPF POC: NONE SEEN
SPERM: NONE SEEN
Trich, Wet Prep: NONE SEEN
YEAST WET PREP: NONE SEEN

## 2016-07-18 LAB — POC URINE PREG, ED: PREG TEST UR: NEGATIVE

## 2016-07-18 NOTE — ED Provider Notes (Signed)
[facility]-EMERGENCY DEPT Provider Note   CSN: [id number] Arrival date & time: 07/18/16  2058     History   Chief Complaint Chief Complaint  Patient presents with  . Abdominal Pain    HPI Priscilla Delgado is a 35 y.o. female [phone] with a hx of PUD, GERD presents to the Emergency Department complaining of gradual, persistent, progressively worsening RLQ abd pain onset 1 week ago. Pt reports the pain is constant, but she is unable to described the pain.  Pt reports pain is worse with movement.  Associated symptoms include low back pain.  She denies flank pain.  No treatment PTA.  Pt denies fever, chills, headache, neck pain, chest pain, SOB, N/V/D, weakness, dizziness, syncope, dysuria, hematuria.    The history is provided by the patient and medical records. No language interpreter was used.    Past Medical History:  Diagnosis Date  . Ulcer of the stomach and intestine     Patient Active Problem List   Diagnosis Date Noted  . Acid reflux 10/09/2015  . Constipation 10/09/2015  . Postpartum breast / nipple pain 04/18/2015  . Wound of skin 02/13/2015  . Previous cesarean section complicating pregnancy 01/30/2015    Past Surgical History:  Procedure Laterality Date  . CESAREAN SECTION      OB History    Gravida Para Term Preterm AB Living   3 3 3  0 0 3   SAB TAB Ectopic Multiple Live Births   0 0 0 0 3      Obstetric Comments   C/s for breech- leg was coming down       Home Medications    Prior to Admission medications   Not on File    Family History No family history on file.  Social History Social History  Substance Use Topics  . Smoking status: Never Smoker  . Smokeless tobacco: Never Used  . Alcohol use No     Allergies   Review of patient's allergies indicates no known allergies.   Review of Systems Review of Systems  Gastrointestinal: Positive for abdominal pain (RLQ).  Musculoskeletal: Positive for back pain (Low, right sided).  All other  systems reviewed and are negative.    Physical Exam Updated Vital Signs BP 97/61   Pulse 62   Temp 97.7 F (36.5 C) (Oral)   Resp 16   Wt 122 kg   LMP 06/27/2016   SpO2 99%   BMI 44.08 kg/m   Physical Exam  Constitutional: She appears well-developed and well-nourished. No distress.  Awake, alert, nontoxic appearance  HENT:  Head: Normocephalic and atraumatic.  Mouth/Throat: Oropharynx is clear and moist. No oropharyngeal exudate.  Eyes: Conjunctivae are normal. No scleral icterus.  Neck: Normal range of motion. Neck supple.  Full ROM without pain  Cardiovascular: Normal rate, regular rhythm, normal heart sounds and intact distal pulses.   No murmur heard. Pulmonary/Chest: Effort normal and breath sounds normal. No respiratory distress. She has no wheezes.  Equal chest expansion  Abdominal: Soft. Bowel sounds are normal. She exhibits no distension and no mass. There is no tenderness. There is no rigidity, no rebound, no guarding, no tenderness at McBurney's point and negative Murphy's sign. Hernia confirmed negative in the right inguinal area and confirmed negative in the left inguinal area.  Genitourinary: Uterus normal. No labial fusion. There is no rash, tenderness or lesion on the right labia. There is no rash, tenderness or lesion on the left labia. Uterus is not deviated, not enlarged, not  fixed and not tender. Cervix exhibits no motion tenderness, no discharge and no friability. Right adnexum displays no mass, no tenderness and no fullness. Left adnexum displays no mass, no tenderness and no fullness. No erythema, tenderness or bleeding in the vagina. No foreign body in the vagina. No signs of injury around the vagina. No vaginal discharge found.  Musculoskeletal: Normal range of motion. She exhibits no edema.  Full range of motion of the T-spine and L-spine No midline tenderness to the  T-spine or L-spine No reproducible tenderness to palpation of the paraspinous muscles of  the L-spine  Lymphadenopathy:    She has no cervical adenopathy.       Right: No inguinal adenopathy present.       Left: No inguinal adenopathy present.  Neurological: She is alert. She has normal reflexes.  Reflex Scores:      Bicep reflexes are 2+ on the right side and 2+ on the left side.      Brachioradialis reflexes are 2+ on the right side and 2+ on the left side.      Patellar reflexes are 2+ on the right side and 2+ on the left side.      Achilles reflexes are 2+ on the right side and 2+ on the left side. Speech is clear and goal oriented Moves extremities without ataxia  Skin: Skin is warm and dry. No rash noted. She is not diaphoretic. No erythema.  Psychiatric: She has a normal mood and affect. Her behavior is normal.  Nursing note and vitals reviewed.    ED Treatments / Results  Labs (all labs ordered are listed, but only abnormal results are displayed) Labs Reviewed  WET PREP, GENITAL - Abnormal; Notable for the following:       Result Value   WBC, Wet Prep HPF POC MANY (*)    All other components within normal limits  CBC WITH DIFFERENTIAL/PLATELET - Abnormal; Notable for the following:    Hemoglobin 11.3 (*)    HCT 35.6 (*)    All other components within normal limits  COMPREHENSIVE METABOLIC PANEL - Abnormal; Notable for the following:    Calcium 8.8 (*)    AST 14 (*)    ALT 12 (*)    Total Bilirubin 0.2 (*)    All other components within normal limits  URINALYSIS, ROUTINE W REFLEX MICROSCOPIC (NOT AT [facility])  POC URINE PREG, ED  GC/CHLAMYDIA PROBE AMP ([place]) [facility]    EKG  EKG Interpretation None       Radiology No results found.  Procedures Procedures (including critical care time)  Medications Ordered in ED Medications  iopamidol (ISOVUE-300) 61 % injection (not administered)     Initial Impression / Assessment and Plan / ED Course  I have reviewed the triage vital signs and the nursing notes.  Pertinent labs & imaging  results that were available during my care of the patient were reviewed by me and considered in my medical decision making (see chart for details).  Clinical Course  Value Comment By Time  Hemoglobin: (!) 11.3 Mild anemia, not far from baseline [facility] Kaycie Pegues, PA-C 09/24 2215  BP: 119/69 VSS - no tachycardia Dierdre ForthHannah Eschol Auxier, PA-C 09/24 2215  Preg Test, Ur: NEGATIVE Neg Lino Wickliff, PA-C 09/24 2228  Leukocytes, UA: NEGATIVE No UTI Dierdre ForthHannah Leviathan Macera, PA-C 09/24 2228    Pt Presents with one week of right lower quadrant abdominal pain. It is not reproducible on abdominal exam. She also has right lower  back pain over the SI joint but this is also not reproducible. No flank pain or CVA tenderness. Patient without urinary symptoms. UA without evidence of urinary tract infection. Pelvic exam shows white blood cells on that perhaps the patient had no adnexal or cervical motion tenderness. Discharge did not appear clinically infected. It was nonodorous.  Gonorrhea and Chlamydia cultures are pending. She has been afebrile without tachycardia or hypotension.  Pt initially agreed to CT scan but then changed her mind.  She reports that she does not want a CT scan at this time.  Patient understands that this was ordered and an effort to rule out emergent conditions including abdominal infections like diverticulitis, appendicitis, etc. She understands that we are unable to definitively rule out emergent conditions without the CT scan. She is alert and oriented 3 and competent to make this decision. She reports she will come back if her pain worsens. There was no family at bedside to help persuade her.  I have related to her that she may be seen at any time for further evaluation of this pain. Patient will be discharged AMA.  Final Clinical Impressions(s) / ED Diagnoses   Final diagnoses:  Right lower quadrant abdominal pain    New Prescriptions Current Discharge Medication List         Dierdre Forth, PA-C 07/19/16 1610    Blane Ohara, MD 07/20/16 1539

## 2016-07-18 NOTE — ED Triage Notes (Signed)
Patient presents with c/o right side pain that increases with her period.  Denies urinary symptoms  Also c/o flank pain at times

## 2016-07-19 ENCOUNTER — Emergency Department (HOSPITAL_COMMUNITY): Payer: BLUE CROSS/BLUE SHIELD

## 2016-07-19 LAB — GC/CHLAMYDIA PROBE AMP (~~LOC~~) NOT AT ARMC
Chlamydia: NEGATIVE
NEISSERIA GONORRHEA: NEGATIVE

## 2016-07-19 MED ORDER — IOPAMIDOL (ISOVUE-300) INJECTION 61%
INTRAVENOUS | Status: AC
  Filled 2016-07-19: qty 100

## 2016-07-19 NOTE — ED Notes (Signed)
The pt refused her c-t scan she was brought back .  She is now asleep

## 2016-07-19 NOTE — ED Notes (Signed)
ubsuccessful attempts x 2 to start iv

## 2016-07-19 NOTE — Discharge Instructions (Signed)
1. Medications: usual home medications °2. Treatment: rest, drink plenty of fluids, advance diet slowly °3. Follow Up: Please followup with your primary doctor in 2 days for discussion of your diagnoses and further evaluation after today's visit; if you do not have a primary care doctor use the resource guide provided to find one; Please return to the ER for persistent vomiting, high fevers or worsening symptoms °

## 2016-12-15 ENCOUNTER — Encounter: Payer: Self-pay | Admitting: Family Medicine

## 2016-12-15 ENCOUNTER — Ambulatory Visit (INDEPENDENT_AMBULATORY_CARE_PROVIDER_SITE_OTHER): Payer: BLUE CROSS/BLUE SHIELD | Admitting: Family Medicine

## 2016-12-15 VITALS — BP 111/58 | HR 62 | Temp 98.0°F | Resp 16 | Ht 65.5 in | Wt 264.0 lb

## 2016-12-15 DIAGNOSIS — Z30013 Encounter for initial prescription of injectable contraceptive: Secondary | ICD-10-CM

## 2016-12-15 DIAGNOSIS — R82998 Other abnormal findings in urine: Secondary | ICD-10-CM

## 2016-12-15 DIAGNOSIS — R8299 Other abnormal findings in urine: Secondary | ICD-10-CM

## 2016-12-15 DIAGNOSIS — E6609 Other obesity due to excess calories: Secondary | ICD-10-CM | POA: Diagnosis not present

## 2016-12-15 LAB — LIPID PANEL
CHOLESTEROL: 152 mg/dL (ref <200)
HDL: 50 mg/dL — AB (ref >50)
LDL CALC: 91 mg/dL (ref <100)
TRIGLYCERIDES: 56 mg/dL (ref <150)
Total CHOL/HDL Ratio: 3 Ratio (ref <5.0)
VLDL: 11 mg/dL (ref <30)

## 2016-12-15 LAB — BASIC METABOLIC PANEL
BUN: 10 mg/dL (ref 7–25)
CALCIUM: 8.8 mg/dL (ref 8.6–10.2)
CHLORIDE: 106 mmol/L (ref 98–110)
CO2: 25 mmol/L (ref 20–31)
Creat: 0.82 mg/dL (ref 0.50–1.10)
Glucose, Bld: 90 mg/dL (ref 65–99)
POTASSIUM: 4.5 mmol/L (ref 3.5–5.3)
Sodium: 138 mmol/L (ref 135–146)

## 2016-12-15 LAB — POCT URINALYSIS DIP (DEVICE)
Bilirubin Urine: NEGATIVE
Glucose, UA: NEGATIVE mg/dL
HGB URINE DIPSTICK: NEGATIVE
KETONES UR: NEGATIVE mg/dL
Nitrite: NEGATIVE
PH: 6 (ref 5.0–8.0)
PROTEIN: NEGATIVE mg/dL
SPECIFIC GRAVITY, URINE: 1.02 (ref 1.005–1.030)
Urobilinogen, UA: 0.2 mg/dL (ref 0.0–1.0)

## 2016-12-15 LAB — POCT GLYCOSYLATED HEMOGLOBIN (HGB A1C): Hemoglobin A1C: 5.3

## 2016-12-15 LAB — TSH: TSH: 1.23 mIU/L

## 2016-12-15 MED ORDER — MEDROXYPROGESTERONE ACETATE 150 MG/ML IM SUSP
150.0000 mg | Freq: Once | INTRAMUSCULAR | Status: AC
  Administered 2016-12-15: 150 mg via INTRAMUSCULAR

## 2016-12-15 NOTE — Progress Notes (Signed)
Subjective:    Patient ID: Priscilla Delgado, female    DOB: 04/15/1981, 36 y.o.   MRN: [id number]  HPI Ms. Priscilla Delgado, a 36 year old female with a history of obesity presents for oral contraception counseling. [place] ago.  Patient reports that she has 3 children and is interested in starting birth control to prevent pregnancy. She states that she is married and sexually active.  She has normal menstrual periods. Her last period was on 12/06/2016 and it was normal. Patient denies history of  hypertension, liver disease, migraines, pelvic inflammatory disease, sexually transmitted diseases, thromboembolism and urinary tract infections.  Past Medical History:  Diagnosis Date  . Ulcer of the stomach and intestine    Social History   Social History  . Marital status: Married    Spouse name: N/A  . Number of children: N/A  . Years of education: N/A   Occupational History  . Not on file.   Social History Main Topics  . Smoking status: Never Smoker  . Smokeless tobacco: Never Used  . Alcohol use No  . Drug use: No  . Sexual activity: Not Currently   Other Topics Concern  . Not on file   Social History Narrative  . No narrative on file   Immunization History  Administered Date(s) Administered  . Tdap 01/30/2015  No Known Allergies Review of Systems  Constitutional: Positive for unexpected weight change (weight gain). Negative for fatigue and fever.  HENT: Negative.   Eyes: Negative.   Gastrointestinal: Positive for abdominal pain (periodically, history of ulcers).  Endocrine: Negative.   Genitourinary: Negative.  Negative for menstrual problem, vaginal discharge and vaginal pain.  Musculoskeletal: Negative.   Skin: Negative.   Allergic/Immunologic: Negative.  Negative for immunocompromised state.  Neurological: Negative.   Hematological: Negative.   Psychiatric/Behavioral: Negative.        Objective:   Physical Exam  Constitutional: She is  oriented to person, place, and time. She appears well-developed and well-nourished.  HENT:  Head: Normocephalic and atraumatic.  Right Ear: External ear normal.  Left Ear: External ear normal.  Nose: Nose normal.  Mouth/Throat: Oropharynx is clear and moist.  Eyes: Conjunctivae and EOM are normal. Pupils are equal, round, and reactive to light.  Neck: Normal range of motion. Neck supple.  Cardiovascular: Normal rate, regular rhythm, normal heart sounds and intact distal pulses.   Pulmonary/Chest: Effort normal and breath sounds normal.  Abdominal: Soft. Bowel sounds are normal. There is generalized tenderness.  Musculoskeletal: Normal range of motion.  Neurological: She is alert and oriented to person, place, and time. She has normal reflexes.  Skin: Skin is warm and dry.  Psychiatric: She has a normal mood and affect. Her behavior is normal. Judgment and thought content normal.     BP (!) 111/58 (BP Location: Right Arm, Patient Position: Sitting, Cuff Size: Large)   Pulse 62   Temp 98 F (36.7 C) (Oral)   Resp 16   Ht 5' 5.5" (1.664 m)   Wt 264 lb (119.7 kg)   LMP 12/06/2016   SpO2 100%   BMI 43.26 kg/m  Assessment & Plan:  1. Initiation of Depo Provera Patient to return on Mar 08, 2017 for a depo-provera injection. She expressed understanding.  - medroxyPROGESTERone (DEPO-PROVERA) injection 150 mg; Inject 1 mL (150 mg total) into the muscle once. - POCT urine pregnancy - POCT urine pregnancy  2. Class 1 obesity due to excess calories with serious comorbidity in  adult, unspecified BMI Recommend a lowfat, low carbohydrate diet divided over 5-6 small meals, increase water intake to 6-8 glasses, and 150 minutes per week of cardiovascular exercise.   - TSH - Basic Metabolic Panel - HgB A1c - Lipid Panel  3. Urine leukocytes  - Urine culture   RTC: 3 months for depo provera injection   Marka Treloar M, FNP

## 2016-12-15 NOTE — Patient Instructions (Addendum)
Hormonal Contraception Information Introduction Estrogen and progesterone (progestin) are hormones used in many forms of birth control (contraception). These two hormones make up most hormonal contraceptives. Hormonal contraceptives use either:  A combination of estrogen hormone and progesterone hormone in one of these forms:  Pill. Pills come in various combinations of active hormone pills and nonhormonal pills. Different combinations of pills may give you a period once a month, once every 3 months, or no period at all. It is important to take the pills the same time each day.  Patch. The patch is placed on the lower abdomen every week for 3 weeks. On the fourth week, the patch is not placed.  Vaginal ring. The ring is placed in the vagina and left there for 3 weeks. It is then removed for 1 week.  Progesterone alone in one of these forms:  Pill. Hormone pills are taken every day of the cycle.  Intrauterine device (IUD). The IUD is inserted during a menstrual period and removed or replaced every 5 years or sooner.  Implant. Plastic rods are placed under the skin of the upper arm. They are removed or replaced every 3 years or sooner.  Injection. The injection is given once every 90 days. Pregnancy can still occur with any of these hormonal contraceptive methods. If you have any suspicion that you might be pregnant, take a pregnancy test and talk to your health care provider. Estrogen and progesterone contraceptives Estrogen and progesterone contraceptives can prevent pregnancy by:  Stopping the release of an egg (ovulation).  Thickening the mucus of the cervix, making it difficult for sperm to enter the uterus.  Changing the lining of the uterus. This change makes it more difficult for an egg to implant. Progesterone contraceptives Progesterone-only contraceptives can prevent pregnancy by:  Blocking ovulation. This occurs in many women, but some women will continue to  ovulate.  Preventing the entry of sperm into the uterus by keeping the cervical mucus thick and sticky.  Changing the lining of the uterus. This change makes it more difficult for an egg to implant. Side effects Talk to your health care provider about what side effects may affect you. If you develop persistent side effects or if the effects are severe, talk to your health care provider.  Estrogen. Side effects from estrogen occur more often in the first 2-3 months. They include:  Progesterone. Side effects of progesterone can vary. They include: Questions to ask This information is not intended to replace advice given to you by your health care provider. Make sure you discuss any questions you have with your health care provider. Document Released: 10/31/2007 Document Revised: 07/14/2016 Document Reviewed: 03/25/2013  2017 Elsevier  Obesity, Adult Obesity is the condition of having too much total body fat. Being overweight or obese means that your weight is greater than what is considered healthy for your body size. Obesity is determined by a measurement called BMI. BMI is an estimate of body fat and is calculated from height and weight. For adults, a BMI of 30 or higher is considered obese. Obesity can eventually lead to other health concerns and major illnesses, including:  Stroke.  Coronary artery disease (CAD).  Type 2 diabetes.  Some types of cancer, including cancers of the colon, breast, uterus, and gallbladder.  Osteoarthritis.  High blood pressure (hypertension).  High cholesterol.  Sleep apnea.  Gallbladder stones.  Infertility problems. What are the causes? The main cause of obesity is taking in (consuming) more calories than your body uses  for energy. Other factors that contribute to this condition may include:  Being born with genes that make you more likely to become obese.  Having a medical condition that causes obesity. These conditions  include:  Hypothyroidism.  Polycystic ovarian syndrome (PCOS).  Binge-eating disorder.  Cushing syndrome.  Taking certain medicines, such as steroids, antidepressants, and seizure medicines.  Not being physically active (sedentary lifestyle).  Living where there are limited places to exercise safely or buy healthy foods.  Not getting enough sleep. What increases the risk? The following factors may increase your risk of this condition:  Having a family history of obesity.  Being a woman of African-American descent.  Being a man of Hispanic descent. What are the signs or symptoms? Having excessive body fat is the main symptom of this condition. How is this diagnosed? This condition may be diagnosed based on:  Your symptoms.  Your medical history.  A physical exam. Your health care provider may measure:  Your BMI. If you are an adult with a BMI between 25 and less than 30, you are considered overweight. If you are an adult with a BMI of 30 or higher, you are considered obese.  The distances around your hips and your waist (circumferences). These may be compared to each other to help diagnose your condition.  Your skinfold thickness. Your health care provider may gently pinch a fold of your skin and measure it. How is this treated? Treatment for this condition often includes changing your lifestyle. Treatment may include some or all of the following:  Dietary changes. Work with your health care provider and a dietitian to set a weight-loss goal that is healthy and reasonable for you. Dietary changes may include eating:  Smaller portions. A portion size is the amount of a particular food that is healthy for you to eat at one time. This varies from person to person.  Low-calorie or low-fat options.  More whole grains, fruits, and vegetables.  Regular physical activity. This may include aerobic activity (cardio) and strength training.  Medicine to help you lose weight.  Your health care provider may prescribe medicine if you are unable to lose 1 pound a week after 6 weeks of eating more healthily and doing more physical activity.  Surgery. Surgical options may include gastric banding and gastric bypass. Surgery may be done if:  Other treatments have not helped to improve your condition.  You have a BMI of 40 or higher.  You have life-threatening health problems related to obesity. Follow these instructions at home:   Eating and drinking  Follow recommendations from your health care provider about what you eat and drink. Your health care provider may advise you to:  Limit fast foods, sweets, and processed snack foods.  Choose low-fat options, such as low-fat milk instead of whole milk.  Eat 5 or more servings of fruits or vegetables every day.  Eat at home more often. This gives you more control over what you eat.  Choose healthy foods when you eat out.  Learn what a healthy portion size is.  Keep low-fat snacks on hand.  Avoid sugary drinks, such as soda, fruit juice, iced tea sweetened with sugar, and flavored milk.  Eat a healthy breakfast.  Drink enough water to keep your urine clear or pale yellow.  Do not go without eating for long periods of time (do not fast) or follow a fad diet. Fasting and fad diets can be unhealthy and even dangerous. Physical Activity  Exercise regularly,  as told by your health care provider. Ask your health care provider what types of exercise are safe for you and how often you should exercise.  Warm up and stretch before being active.  Cool down and stretch after being active.  Rest between periods of activity. Lifestyle  Limit the time that you spend in front of your TV, computer, or video game system.  Find ways to reward yourself that do not involve food.  Limit alcohol intake to no more than 1 drink a day for nonpregnant women and 2 drinks a day for men. One drink equals 12 oz of beer, 5 oz of  wine, or 1 oz of hard liquor. General instructions  Keep a weight loss journal to keep track of the food you eat and how much you exercise you get.  Take over-the-counter and prescription medicines only as told by your health care provider.  Take vitamins and supplements only as told by your health care provider.  Consider joining a support group. Your health care provider may be able to recommend a support group.  Keep all follow-up visits as told by your health care provider. This is important. Contact a health care provider if:  You are unable to meet your weight loss goal after 6 weeks of dietary and lifestyle changes. This information is not intended to replace advice given to you by your health care provider. Make sure you discuss any questions you have with your health care provider. Document Released: 11/18/2004 Document Revised: 03/15/2016 Document Reviewed: 07/30/2015 Elsevier Interactive Patient Education  2017 [employer].

## 2016-12-16 LAB — POCT URINE PREGNANCY: PREG TEST UR: NEGATIVE

## 2016-12-16 LAB — URINE CULTURE

## 2017-03-08 ENCOUNTER — Ambulatory Visit: Payer: BLUE CROSS/BLUE SHIELD | Admitting: Family Medicine

## 2017-05-11 ENCOUNTER — Encounter: Payer: Self-pay | Admitting: Family Medicine

## 2017-05-11 ENCOUNTER — Ambulatory Visit (INDEPENDENT_AMBULATORY_CARE_PROVIDER_SITE_OTHER): Payer: Medicaid Other | Admitting: Family Medicine

## 2017-05-11 VITALS — BP 104/79 | HR 60 | Temp 98.3°F | Resp 16 | Ht 65.5 in | Wt 278.0 lb

## 2017-05-11 DIAGNOSIS — M545 Low back pain, unspecified: Secondary | ICD-10-CM

## 2017-05-11 DIAGNOSIS — N912 Amenorrhea, unspecified: Secondary | ICD-10-CM

## 2017-05-11 LAB — POCT URINALYSIS DIP (DEVICE)
Bilirubin Urine: NEGATIVE
Glucose, UA: NEGATIVE mg/dL
HGB URINE DIPSTICK: NEGATIVE
Ketones, ur: NEGATIVE mg/dL
LEUKOCYTES UA: NEGATIVE
Nitrite: NEGATIVE
PH: 5.5 (ref 5.0–8.0)
Protein, ur: NEGATIVE mg/dL
UROBILINOGEN UA: 0.2 mg/dL (ref 0.0–1.0)

## 2017-05-11 LAB — POCT URINE PREGNANCY: Preg Test, Ur: NEGATIVE

## 2017-05-11 MED ORDER — NAPROXEN 500 MG PO TABS: 500.0000 mg | ORAL_TABLET | Freq: Two times a day (BID) | ORAL | 0 refills | Status: DC

## 2017-05-11 MED ORDER — KETOROLAC TROMETHAMINE 60 MG/2ML IM SOLN
60.0000 mg | Freq: Once | INTRAMUSCULAR | Status: AC
  Administered 2017-05-11: 60 mg via INTRAMUSCULAR

## 2017-05-11 NOTE — Patient Instructions (Addendum)
Back pain: Recommend weight loss to assist with decreasing back pain. Will start a a trial of Naprosyn 500 mg twice daily as needed for mild to moderate back pain.  Perform back exercises as instructed.  Morbid obesity (HCC) Maintaining a healthy weight usually helps patients to be more consistent with exercise. This is because overweight patients often have fatigue, difficulty breathing or shortness of breath as they exercise, which may cause them to avoid regular physical activity3.   Recommend a lowfat, low carbohydrate diet divided over 5-6 small meals, increase water intake to 6-8 glasses, and 150 minutes per week of cardiovascular exercise.     Back Exercises If you have pain in your back, do these exercises 2-3 times each day or as told by your doctor. When the pain goes away, do the exercises once each day, but repeat the steps more times for each exercise (do more repetitions). If you do not have pain in your back, do these exercises once each day or as told by your doctor. Exercises Single Knee to Chest  Do these steps 3-5 times in a row for each leg: 1. Lie on your back on a firm bed or the floor with your legs stretched out. 2. Bring one knee to your chest. 3. Hold your knee to your chest by grabbing your knee or thigh. 4. Pull on your knee until you feel a gentle stretch in your lower back. 5. Keep doing the stretch for 10-30 seconds. 6. Slowly let go of your leg and straighten it.  Pelvic Tilt  Do these steps 5-10 times in a row: 1. Lie on your back on a firm bed or the floor with your legs stretched out. 2. Bend your knees so they point up to the ceiling. Your feet should be flat on the floor. 3. Tighten your lower belly (abdomen) muscles to press your lower back against the floor. This will make your tailbone point up to the ceiling instead of pointing down to your feet or the floor. 4. Stay in this position for 5-10 seconds while you gently tighten your muscles and  breathe evenly.  Cat-Cow  Do these steps until your lower back bends more easily: 1. Get on your hands and knees on a firm surface. Keep your hands under your shoulders, and keep your knees under your hips. You may put padding under your knees. 2. Let your head hang down, and make your tailbone point down to the floor so your lower back is round like the back of a cat. 3. Stay in this position for 5 seconds. 4. Slowly lift your head and make your tailbone point up to the ceiling so your back hangs low (sags) like the back of a cow. 5. Stay in this position for 5 seconds.  Press-Ups  Do these steps 5-10 times in a row: 1. Lie on your belly (face-down) on the floor. 2. Place your hands near your head, about shoulder-width apart. 3. While you keep your back relaxed and keep your hips on the floor, slowly straighten your arms to raise the top half of your body and lift your shoulders. Do not use your back muscles. To make yourself more comfortable, you may change where you place your hands. 4. Stay in this position for 5 seconds. 5. Slowly return to lying flat on the floor.  Bridges  Do these steps 10 times in a row: 1. Lie on your back on a firm surface. 2. Bend your knees so they point  up to the ceiling. Your feet should be flat on the floor. 3. Tighten your butt muscles and lift your butt off of the floor until your waist is almost as high as your knees. If you do not feel the muscles working in your butt and the back of your thighs, slide your feet 1-2 inches farther away from your butt. 4. Stay in this position for 3-5 seconds. 5. Slowly lower your butt to the floor, and let your butt muscles relax.  If this exercise is too easy, try doing it with your arms crossed over your chest. Belly Crunches  Do these steps 5-10 times in a row: 1. Lie on your back on a firm bed or the floor with your legs stretched out. 2. Bend your knees so they point up to the ceiling. Your feet should be  flat on the floor. 3. Cross your arms over your chest. 4. Tip your chin a little bit toward your chest but do not bend your neck. 5. Tighten your belly muscles and slowly raise your chest just enough to lift your shoulder blades a tiny bit off of the floor. 6. Slowly lower your chest and your head to the floor.  Back Lifts Do these steps 5-10 times in a row: 1. Lie on your belly (face-down) with your arms at your sides, and rest your forehead on the floor. 2. Tighten the muscles in your legs and your butt. 3. Slowly lift your chest off of the floor while you keep your hips on the floor. Keep the back of your head in line with the curve in your back. Look at the floor while you do this. 4. Stay in this position for 3-5 seconds. 5. Slowly lower your chest and your face to the floor.  Contact a doctor if:  Your back pain gets a lot worse when you do an exercise.  Your back pain does not lessen 2 hours after you exercise. If you have any of these problems, stop doing the exercises. Do not do them again unless your doctor says it is okay. Get help right away if:  You have sudden, very bad back pain. If this happens, stop doing the exercises. Do not do them again unless your doctor says it is okay. This information is not intended to replace advice given to you by your health care provider. Make sure you discuss any questions you have with your health care provider. Document Released: 11/13/2010 Document Revised: 03/18/2016 Document Reviewed: 12/05/2014 Elsevier Interactive Patient Education  2018 [employer]   Fat and Cholesterol Restricted Diet High levels of fat and cholesterol in your blood may lead to various health problems, such as diseases of the heart, blood vessels, gallbladder, liver, and pancreas. Fats are concentrated sources of energy that come in various forms. Certain types of fat, including saturated fat, may be harmful in excess. Cholesterol is a substance needed by your  body in small amounts. Your body makes all the cholesterol it needs. Excess cholesterol comes from the food you eat. When you have high levels of cholesterol and saturated fat in your blood, health problems can develop because the excess fat and cholesterol will gather along the walls of your blood vessels, causing them to narrow. Choosing the right foods will help you control your intake of fat and cholesterol. This will help keep the levels of these substances in your blood within normal limits and reduce your risk of disease. What is my plan? Your health care provider  recommends that you:  Limit your fat intake to ______% or less of your total calories per day.  Limit the amount of cholesterol in your diet to less than _________mg per day.  Eat 20-30 grams of fiber each day.  What types of fat should I choose?  Choose healthy fats more often. Choose monounsaturated and polyunsaturated fats, such as olive and canola oil, flaxseeds, walnuts, almonds, and seeds.  Eat more omega-3 fats. Good choices include salmon, mackerel, sardines, tuna, flaxseed oil, and ground flaxseeds. Aim to eat fish at least two times a week.  Limit saturated fats. Saturated fats are primarily found in animal products, such as meats, butter, and cream. Plant sources of saturated fats include palm oil, palm kernel oil, and coconut oil.  Avoid foods with partially hydrogenated oils in them. These contain trans fats. Examples of foods that contain trans fats are stick margarine, some tub margarines, cookies, crackers, and other baked goods. What general guidelines do I need to follow? These guidelines for healthy eating will help you control your intake of fat and cholesterol:  Check food labels carefully to identify foods with trans fats or high amounts of saturated fat.  Fill one half of your plate with vegetables and green salads.  Fill one fourth of your plate with whole grains. Look for the word "whole" as the  first word in the ingredient list.  Fill one fourth of your plate with lean protein foods.  Limit fruit to two servings a day. Choose fruit instead of juice.  Eat more foods that contain fiber, such as apples, broccoli, carrots, beans, peas, and barley.  Eat more home-cooked food and less restaurant, buffet, and fast food.  Limit or avoid alcohol.  Limit foods high in starch and sugar.  Limit fried foods.  Cook foods using methods other than frying. Baking, boiling, grilling, and broiling are all great options.  Lose weight if you are overweight. Losing just 5-10% of your initial body weight can help your overall health and prevent diseases such as diabetes and heart disease.  What foods can I eat? Grains  Whole grains, such as whole wheat or whole grain breads, crackers, cereals, and pasta. Unsweetened oatmeal, bulgur, barley, quinoa, or brown rice. Corn or whole wheat flour tortillas. Vegetables  Fresh or frozen vegetables (raw, steamed, roasted, or grilled). Green salads. Fruits  All fresh, canned (in natural juice), or frozen fruits. Meats and other protein foods  Ground beef (85% or leaner), grass-fed beef, or beef trimmed of fat. Skinless chicken or [place]. Ground chicken or [place]. Pork trimmed of fat. All fish and seafood. Eggs. Dried beans, peas, or lentils. Unsalted nuts or seeds. Unsalted canned or dry beans. Dairy  Low-fat dairy products, such as skim or 1% milk, 2% or reduced-fat cheeses, low-fat ricotta or cottage cheese, or plain low-fat yo Fats and oils  Tub margarines without trans fats. Light or reduced-fat mayonnaise and salad dressings. Avocado. Olive, canola, sesame, or safflower oils. Natural peanut or almond butter (choose ones without added sugar and oil). The items listed above may not be a complete list of recommended foods or beverages. Contact your dietitian for more options. Foods to avoid Grains  White bread. White pasta. White rice. Cornbread.  Bagels, pastries, and croissants. Crackers that contain trans fat. Vegetables  White potatoes. Corn. Creamed or fried vegetables. Vegetables in a cheese sauce. Fruits  Dried fruits. Canned fruit in light or heavy syrup. Fruit juice. Meats and other protein foods  Fatty cuts of  meat. Ribs, chicken wings, bacon, sausage, bologna, salami, chitterlings, fatback, hot dogs, bratwurst, and packaged luncheon meats. Liver and organ meats. Dairy  Whole or 2% milk, cream, half-and-half, and cream cheese. Whole milk cheeses. Whole-fat or sweetened yogurt. Full-fat cheeses. Nondairy creamers and whipped toppings. Processed cheese, cheese spreads, or cheese curds. Beverages  Alcohol. Sweetened drinks (such as sodas, lemonade, and fruit drinks or punches). Fats and oils  Butter, stick margarine, lard, shortening, ghee, or bacon fat. Coconut, palm kernel, or palm oils. Sweets and desserts  Corn syrup, sugars, honey, and molasses. Candy. Jam and jelly. Syrup. Sweetened cereals. Cookies, pies, cakes, donuts, muffins, and ice cream. The items listed above may not be a complete list of foods and beverages to avoid. Contact your dietitian for more information. This information is not intended to replace advice given to you by your health care provider. Make sure you discuss any questions you have with your health care provider. Document Released: 10/11/2005 Document Revised: 11/01/2014 Document Reviewed: 01/09/2014 Elsevier Interactive Patient Education  2017 [employer] Calorie Counting for [employer] Calories are units of energy. Your body needs a certain amount of calories from food to keep you going throughout the day. When you eat more calories than your body needs, your body stores the extra calories as fat. When you eat fewer calories than your body needs, your body burns fat to get the energy it needs. Calorie counting means keeping track of how many calories you eat and drink each day. Calorie  counting can be helpful if you need to lose weight. If you make sure to eat fewer calories than your body needs, you should lose weight. Ask your health care provider what a healthy weight is for you. For calorie counting to work, you will need to eat the right number of calories in a day in order to lose a healthy amount of weight per week. A dietitian can help you determine how many calories you need in a day and will give you suggestions on how to reach your calorie goal.  A healthy amount of weight to lose per week is usually 1-2 lb (0.5-0.9 kg). This usually means that your daily calorie intake should be reduced by 500-750 calories.  Eating 1,200 - 1,500 calories per day can help most women lose weight.  Eating 1,500 - 1,800 calories per day can help most men lose weight.  What is my plan? My goal is to have __________ calories per day. If I have this many calories per day, I should lose around __________ pounds per week. What do I need to know about calorie counting? In order to meet your daily calorie goal, you will need to:  Find out how many calories are in each food you would like to eat. Try to do this before you eat.  Decide how much of the food you plan to eat.  Write down what you ate and how many calories it had. Doing this is called keeping a food log.  To successfully lose weight, it is important to balance calorie counting with a healthy lifestyle that includes regular activity. Aim for 150 minutes of moderate exercise (such as walking) or 75 minutes of vigorous exercise (such as running) each week. Where do I find calorie information?  The number of calories in a food can be found on a Nutrition Facts label. If a food does not have a Nutrition Facts label, try to look up the calories online or ask your dietitian for help.  Remember that calories are listed per serving. If you choose to have more than one serving of a food, you will have to multiply the calories per serving  by the amount of servings you plan to eat. For example, the label on a package of bread might say that a serving size is 1 slice and that there are 90 calories in a serving. If you eat 1 slice, you will have eaten 90 calories. If you eat 2 slices, you will have eaten 180 calories. How do I keep a food log? Immediately after each meal, record the following information in your food log:  What you ate. Don't forget to include toppings, sauces, and other extras on the food.  How much you ate. This can be measured in cups, ounces, or number of items.  How many calories each food and drink had.  The total number of calories in the meal.  Keep your food log near you, such as in a small notebook in your pocket, or use a mobile app or website. Some programs will calculate calories for you and show you how many calories you have left for the day to meet your goal. What are some calorie counting tips?  Use your calories on foods and drinks that will fill you up and not leave you hungry: ? Some examples of foods that fill you up are nuts and nut butters, vegetables, lean proteins, and high-fiber foods like whole grains. High-fiber foods are foods with more than 5 g fiber per serving. ? Drinks such as sodas, specialty coffee drinks, alcohol, and juices have a lot of calories, yet do not fill you up.  Eat nutritious foods and avoid empty calories. Empty calories are calories you get from foods or beverages that do not have many vitamins or protein, such as candy, sweets, and soda. It is better to have a nutritious high-calorie food (such as an avocado) than a food with few nutrients (such as a bag of chips).  Know how many calories are in the foods you eat most often. This will help you calculate calorie counts faster.  Pay attention to calories in drinks. Low-calorie drinks include water and unsweetened drinks.  Pay attention to nutrition labels for "low fat" or "fat free" foods. These foods sometimes  have the same amount of calories or more calories than the full fat versions. They also often have added sugar, starch, or salt, to make up for flavor that was removed with the fat.  Find a way of tracking calories that works for you. Get creative. Try different apps or programs if writing down calories does not work for you. What are some portion control tips?  Know how many calories are in a serving. This will help you know how many servings of a certain food you can have.  Use a measuring cup to measure serving sizes. You could also try weighing out portions on a kitchen scale. With time, you will be able to estimate serving sizes for some foods.  Take some time to put servings of different foods on your favorite plates, bowls, and cups so you know what a serving looks like.  Try not to eat straight from a bag or box. Doing this can lead to overeating. Put the amount you would like to eat in a cup or on a plate to make sure you are eating the right portion.  Use smaller plates, glasses, and bowls to prevent overeating.  Try not to multitask (for example, watch  TV or use your computer) while eating. If it is time to eat, sit down at a table and enjoy your food. This will help you to know when you are full. It will also help you to be aware of what you are eating and how much you are eating. What are tips for following this plan? Reading food labels  Check the calorie count compared to the serving size. The serving size may be smaller than what you are used to eating.  Check the source of the calories. Make sure the food you are eating is high in vitamins and protein and low in saturated and trans fats. Shopping  Read nutrition labels while you shop. This will help you make healthy decisions before you decide to purchase your food.  Make a grocery list and stick to it. Cooking  Try to cook your favorite foods in a healthier way. For example, try baking instead of frying.  Use low-fat  dairy products. Meal planning  Use more fruits and vegetables. Half of your plate should be fruits and vegetables.  Include lean proteins like poultry and fish. How do I count calories when eating out?  Ask for smaller portion sizes.  Consider sharing an entree and sides instead of getting your own entree.  If you get your own entree, eat only half. Ask for a box at the beginning of your meal and put the rest of your entree in it so you are not tempted to eat it.  If calories are listed on the menu, choose the lower calorie options.  Choose dishes that include vegetables, fruits, whole grains, low-fat dairy products, and lean protein.  Choose items that are boiled, broiled, grilled, or steamed. Stay away from items that are buttered, battered, fried, or served with cream sauce. Items labeled "crispy" are usually fried, unless stated otherwise.  Choose water, low-fat milk, unsweetened iced tea, or other drinks without added sugar. If you want an alcoholic beverage, choose a lower calorie option such as a glass of wine or light beer.  Ask for dressings, sauces, and syrups on the side. These are usually high in calories, so you should limit the amount you eat.  If you want a salad, choose a garden salad and ask for grilled meats. Avoid extra toppings like bacon, cheese, or fried items. Ask for the dressing on the side, or ask for olive oil and vinegar or lemon to use as dressing.  Estimate how many servings of a food you are given. For example, a serving of cooked rice is  cup or about the size of half a baseball. Knowing serving sizes will help you be aware of how much food you are eating at restaurants. The list below tells you how big or small some common portion sizes are based on everyday objects: ? 1 oz-4 stacked dice. ? 3 oz-1 deck of cards. ? 1 tsp-1 die. ? 1 Tbsp- a ping-pong ball. ? 2 Tbsp-1 ping-pong ball. ?  cup- baseball. ? 1 cup-1 baseball. Summary  Calorie counting  means keeping track of how many calories you eat and drink each day. If you eat fewer calories than your body needs, you should lose weight.  A healthy amount of weight to lose per week is usually 1-2 lb (0.5-0.9 kg). This usually means reducing your daily calorie intake by 500-750 calories.  The number of calories in a food can be found on a Nutrition Facts label. If a food does not have a Nutrition Facts  label, try to look up the calories online or ask your dietitian for help.  Use your calories on foods and drinks that will fill you up, and not on foods and drinks that will leave you hungry.  Use smaller plates, glasses, and bowls to prevent overeating. This information is not intended to replace advice given to you by your health care provider. Make sure you discuss any questions you have with your health care provider. Document Released: 10/11/2005 Document Revised: 09/10/2016 Document Reviewed: 09/10/2016 Elsevier Interactive Patient Education  2017 [employer].

## 2017-05-11 NOTE — Progress Notes (Signed)
Subjective:    Patient ID: Priscilla Delgado, female    DOB: 1981-07-15, 36 y.o.   MRN: [id number]  HPI Ms. Ester Kudo, a 36 year old female with a history of obesity presents complaining of amenorrhea. She was evaluated and counseled in January for contraception. She received Depo provera. She was scheduled to return in May for contraception follow up. She says that she has not had a menstrual cycle. She says that she wants to "see her period". She no longer wants birth control. She is sexually active. She is also complaining of low back pain.  Patient presents for presents evaluation of low back problems.  Symptoms have been present for several months and include stiffness on awakening.    Exacerbating factors identifiable by patient are recumbency, running, sitting and standing. Current pain intensity is 6/10 and is described as aching and intermittent. She has not attempted any OTC interventions to alleviate current symptoms.  Past Medical History:  Diagnosis Date  . Ulcer of the stomach and intestine    Social History   Social History  . Marital status: Married    Spouse name: N/A  . Number of children: N/A  . Years of education: N/A   Occupational History  . Not on file.   Social History Main Topics  . Smoking status: Never Smoker  . Smokeless tobacco: Never Used  . Alcohol use No  . Drug use: No  . Sexual activity: Not Currently   Other Topics Concern  . Not on file   Social History Narrative  . No narrative on file   Immunization History  Administered Date(s) Administered  . Tdap 01/30/2015  No Known Allergies Review of Systems  Constitutional: Positive for unexpected weight change (weight gain). Negative for fatigue and fever.  HENT: Negative.   Eyes: Negative.   Gastrointestinal: Positive for abdominal pain.  Endocrine: Negative.   Genitourinary: Negative.  Negative for menstrual problem, vaginal discharge and vaginal pain.  Musculoskeletal: Positive for back  pain.  Skin: Negative.   Allergic/Immunologic: Negative.  Negative for immunocompromised state.  Neurological: Negative.   Hematological: Negative.   Psychiatric/Behavioral: Negative.       Objective:   Physical Exam  Constitutional: She is oriented to person, place, and time. She appears well-developed and well-nourished.  Morbid obesity  HENT:  Head: Normocephalic and atraumatic.  Right Ear: External ear normal.  Left Ear: External ear normal.  Nose: Nose normal.  Mouth/Throat: Oropharynx is clear and moist.  Eyes: Pupils are equal, round, and reactive to light. Conjunctivae and EOM are normal.  Neck: Normal range of motion. Neck supple.  Cardiovascular: Normal rate, regular rhythm, normal heart sounds and intact distal pulses.   Pulmonary/Chest: Effort normal and breath sounds normal.  Abdominal: Soft. Bowel sounds are normal.  Increased abdominal girth  Musculoskeletal:       Lumbar back: She exhibits decreased range of motion and pain.  Neurological: She is alert and oriented to person, place, and time. She has normal reflexes.  Skin: Skin is warm and dry.  Psychiatric: She has a normal mood and affect. Her behavior is normal. Judgment and thought content normal.     BP 104/79 (BP Location: Left Arm, Patient Position: Sitting, Cuff Size: Large)   Pulse 60   Temp 98.3 F (36.8 C) (Oral)   Resp 16   Ht 5' 5.5" (1.664 m)   Wt 278 lb (126.1 kg)   SpO2 100%   BMI 45.56 kg/m  Assessment & Plan:  1. Acute low back pain without sciatica, unspecified back pain laterality Ms.Cusic is complaining of low back pain. I recommend weight loss to assist with back pain.  - naproxen (NAPROSYN) 500 MG tablet; Take 1 tablet (500 mg total) by mouth 2 (two) times daily with a meal.  Dispense: 60 tablet; Refill: 0 - ketorolac (TORADOL) injection 60 mg; Inject 2 mLs (60 mg total) into the muscle once.  2. Amenorrhea, unspecified I suspect that amenorrhea is secondary to depo provera  injections. Urine pregnancy was negative. Informed patient that return to fertility is often delayed after using depo provera. Also, 5% of women experience amenorrhea.   - POCT urine pregnancy   3. Morbid obesity (HCC) Maintaining a healthy weight usually helps patients to be more consistent with exercise. This is because overweight patients often have fatigue, difficulty breathing or shortness of breath as they exercise, which may cause them to avoid regular physical activity3.   Recommend a lowfat, low carbohydrate diet divided over 5-6 small meals, increase water intake to 6-8 glasses, and 150 minutes per week of cardiovascular exercise.    RTC: Follow up in 6 months for a complete physical exam   Nolon NationsLaChina Moore Sheyla Zaffino  MSN, FNP-C [facility] [address]  [place], [place] [id number] [phone]

## 2017-06-22 ENCOUNTER — Encounter (HOSPITAL_COMMUNITY): Payer: Self-pay | Admitting: *Deleted

## 2017-06-22 ENCOUNTER — Emergency Department (HOSPITAL_COMMUNITY)
Admission: EM | Admit: 2017-06-22 | Discharge: 2017-06-22 | Disposition: A | Discharge status: Home / Self Care | Payer: Medicaid Other | Attending: Emergency Medicine | Admitting: Emergency Medicine

## 2017-06-22 DIAGNOSIS — M545 Low back pain, unspecified: Secondary | ICD-10-CM

## 2017-06-22 DIAGNOSIS — R52 Pain, unspecified: Secondary | ICD-10-CM

## 2017-06-22 DIAGNOSIS — Z79899 Other long term (current) drug therapy: Secondary | ICD-10-CM | POA: Insufficient documentation

## 2017-06-22 DIAGNOSIS — R51 Headache: Secondary | ICD-10-CM | POA: Insufficient documentation

## 2017-06-22 LAB — URINALYSIS, ROUTINE W REFLEX MICROSCOPIC
Bilirubin Urine: NEGATIVE
Glucose, UA: NEGATIVE mg/dL
Hgb urine dipstick: NEGATIVE
Ketones, ur: 20 mg/dL — AB
Leukocytes, UA: NEGATIVE
Nitrite: NEGATIVE
Protein, ur: NEGATIVE mg/dL
Specific Gravity, Urine: 1.014 (ref 1.005–1.030)
pH: 5 (ref 5.0–8.0)

## 2017-06-22 LAB — POC URINE PREG, ED: Preg Test, Ur: NEGATIVE

## 2017-06-22 MED ORDER — ACETAMINOPHEN 325 MG PO TABS: 650.0000 mg | ORAL_TABLET | Freq: Four times a day (QID) | ORAL | 0 refills | Status: DC | PRN

## 2017-06-22 NOTE — ED Notes (Signed)
Pt verbalized understanding discharge instructions and denies any further needs or questions at this time. VS stable, ambulatory and steady gait.   

## 2017-06-22 NOTE — Discharge Instructions (Signed)
Please read attached information. If you experience any new or worsening signs or symptoms please return to the emergency room for evaluation. Please follow-up with your primary care provider or specialist as discussed.  °

## 2017-06-22 NOTE — ED Triage Notes (Signed)
Pt reports onset yesterday of fever, bodyaches, headache and back pain. Denies urinary symptoms and no acute distress is noted at triage.

## 2017-06-22 NOTE — ED Provider Notes (Signed)
[facility]-EMERGENCY DEPT Provider Note   CSN: [id number] Arrival date & time: 06/22/17  1025     History   Chief Complaint Chief Complaint  Patient presents with  . Back Pain  . Headache  . Fever    HPI Priscilla Delgado is a 36 y.o. female.  HPI   36 year old female presents today with complaints of headache, back pain, and fever.  Patient reports that while at work yesterday she started developing fatigue, achy back pain, generalized headache, and subjective fever.  Patient notes that she is not taking any medication for this, reports that she no longer has a fever, but has generalized body aches.  Patient denies any upper respiratory congestion, cough, shortness of breath, abdominal pain, nausea vomiting or diarrhea.  She denies any dysuria, or changes in her bowel movements.  Patient denies any close sick contacts.  Patient is requesting a pregnancy test is she was on Depo and has not had her menstrual cycle in 5 months.  Patient headache is nonfocal with no neurological deficits.  Past Medical History:  Diagnosis Date  . Ulcer of the stomach and intestine     Patient Active Problem List   Diagnosis Date Noted  . Acid reflux 10/09/2015  . Constipation 10/09/2015  . Postpartum breast / nipple pain 04/18/2015  . Wound of skin 02/13/2015  . Previous cesarean section complicating pregnancy 01/30/2015    Past Surgical History:  Procedure Laterality Date  . CESAREAN SECTION      OB History    Gravida Para Term Preterm AB Living   3 3 3  0 0 3   SAB TAB Ectopic Multiple Live Births   0 0 0 0 3      Obstetric Comments   C/s for breech- leg was coming down       Home Medications    Prior to Admission medications   Medication Sig Start Date End Date Taking? Authorizing Provider  acetaminophen (TYLENOL) 325 MG tablet Take 2 tablets (650 mg total) by mouth every 6 (six) hours as needed. 06/22/17   Lance Huaracha, Tinnie Gens, PA-C  naproxen (NAPROSYN) 500 MG tablet Take 1 tablet (500  mg total) by mouth 2 (two) times daily with a meal. 05/11/17   Massie Maroon, FNP    Family History History reviewed. No pertinent family history.  Social History Social History  Substance Use Topics  . Smoking status: Never Smoker  . Smokeless tobacco: Never Used  . Alcohol use No     Allergies   Patient has no known allergies.   Review of Systems Review of Systems  All other systems reviewed and are negative.    Physical Exam Updated Vital Signs BP 106/67 (BP Location: Left Arm)   Pulse 63   Temp 98.2 F (36.8 C) (Oral)   Resp 18   SpO2 96%   Physical Exam  Constitutional: She is oriented to person, place, and time. She appears well-developed and well-nourished.  HENT:  Head: Normocephalic and atraumatic.  Eyes: Pupils are equal, round, and reactive to light. Conjunctivae are normal. Right eye exhibits no discharge. Left eye exhibits no discharge. No scleral icterus.  Neck: Normal range of motion. Neck supple. No JVD present. No tracheal deviation present.  Cardiovascular: Normal rate, regular rhythm, normal heart sounds and intact distal pulses.  Exam reveals no gallop.   No murmur heard. Pulmonary/Chest: Effort normal and breath sounds normal. No stridor. She has no wheezes. She has no rales. She exhibits no tenderness.  Abdominal: Soft.  She exhibits no distension and no mass. There is no tenderness. There is no rebound and no guarding. No hernia.  Musculoskeletal:  Tenderness to palpation of bilateral lower lumbar soft tissue no CT or L-spine tenderness distal sensation strength and motor function intact  Neurological: She is alert and oriented to person, place, and time. No cranial nerve deficit or sensory deficit. Coordination normal. GCS eye subscore is 4. GCS verbal subscore is 5. GCS motor subscore is 6.  Psychiatric: She has a normal mood and affect. Her behavior is normal. Judgment and thought content normal.  Nursing note and vitals reviewed.    ED  Treatments / Results  Labs (all labs ordered are listed, but only abnormal results are displayed) Labs Reviewed  URINALYSIS, ROUTINE W REFLEX MICROSCOPIC - Abnormal; Notable for the following:       Result Value   Ketones, ur 20 (*)    All other components within normal limits  POC URINE PREG, ED    EKG  EKG Interpretation None       Radiology No results found.  Procedures Procedures (including critical care time)  Medications Ordered in ED Medications - No data to display   Initial Impression / Assessment and Plan / ED Course  I have reviewed the triage vital signs and the nursing notes.  Pertinent labs & imaging results that were available during my care of the patient were reviewed by me and considered in my medical decision making (see chart for details).      Final Clinical Impressions(s) / ED Diagnoses   Final diagnoses:  Body aches  Acute bilateral low back pain without sciatica   Labs: Point-of-care urine parenchyma urinalysis  Imaging:  Consults:  Therapeutics:  Discharge Meds: Tylenol  Assessment/Plan: 36 year old female presents today with complaints of fever and body aches.  She has no acute signs of infectious etiology here today.  She is afebrile nontoxic, with reassuring vital signs.  No acute source of infection here.  Patient requesting pregnancy test, this is negative.  She is having bilateral lower lumbar back pain, no red flags for back pain.  She will be given Tylenol for this.  Patient will follow-up with her primary care provider for further evaluation and management.  Strict return precautions given.    New Prescriptions New Prescriptions   ACETAMINOPHEN (TYLENOL) 325 MG TABLET    Take 2 tablets (650 mg total) by mouth every 6 (six) hours as needed.     Eyvonne MechanicHedges, Mayra Jolliffe, PA-C 06/22/17 1524    Vanetta MuldersZackowski, Scott, MD 06/22/17 [phone]

## 2017-08-05 ENCOUNTER — Encounter: Payer: Self-pay | Admitting: Family Medicine

## 2017-08-05 ENCOUNTER — Ambulatory Visit (INDEPENDENT_AMBULATORY_CARE_PROVIDER_SITE_OTHER): Payer: Medicaid Other | Admitting: Family Medicine

## 2017-08-05 VITALS — BP 106/55 | HR 59 | Temp 98.5°F | Resp 16 | Ht 65.5 in | Wt 271.0 lb

## 2017-08-05 DIAGNOSIS — M62838 Other muscle spasm: Secondary | ICD-10-CM

## 2017-08-05 DIAGNOSIS — M549 Dorsalgia, unspecified: Secondary | ICD-10-CM | POA: Diagnosis not present

## 2017-08-05 DIAGNOSIS — M542 Cervicalgia: Secondary | ICD-10-CM | POA: Diagnosis not present

## 2017-08-05 MED ORDER — KETOROLAC TROMETHAMINE 60 MG/2ML IM SOLN
60.0000 mg | Freq: Once | INTRAMUSCULAR | Status: AC
  Administered 2017-08-05: 60 mg via INTRAMUSCULAR

## 2017-08-05 MED ORDER — CYCLOBENZAPRINE HCL 5 MG PO TABS: 5.0000 mg | ORAL_TABLET | Freq: Three times a day (TID) | ORAL | 0 refills | Status: DC | PRN

## 2017-08-05 MED ORDER — IBUPROFEN 600 MG PO TABS: 600.0000 mg | ORAL_TABLET | Freq: Three times a day (TID) | ORAL | 0 refills | Status: DC | PRN

## 2017-08-05 NOTE — Progress Notes (Signed)
Subjective:    Priscilla Delgado is a 36 y.o. female who presents for evaluation of neck and upper back pain. The patient has had prior back pain. Symptoms have been present for several week. Patient works a very physical job with repetitive motions. She works at a [employer]. The pain is located primarily in the cervical spine  and radiates to the upper back. The pain is described as intermittent and occurs primarily at work. She rates pain intensity at 7/10. Symptoms are exacerbated by sitting and straining. Symptoms are improved by rest. She has used OTC Tylenol without sustained relief.    Patient is requesting work form completion on today.  Past Medical History:  Diagnosis Date  . Ulcer of the stomach and intestine    Social History   Social History  . Marital status: Married    Spouse name: N/A  . Number of children: N/A  . Years of education: N/A   Occupational History  . Not on file.   Social History Main Topics  . Smoking status: Never Smoker  . Smokeless tobacco: Never Used  . Alcohol use No  . Drug use: No  . Sexual activity: Not Currently   Other Topics Concern  . Not on file   Social History Narrative  . No narrative on file   Immunization History  Administered Date(s) Administered  . Tdap 01/30/2015   Review of Systems  Constitutional: Negative.   HENT: Negative.   Respiratory: Negative.   Cardiovascular: Negative.   Gastrointestinal: Negative.   Genitourinary: Negative.   Musculoskeletal: Positive for back pain and neck pain.  Neurological: Negative.   Psychiatric/Behavioral: Negative.      Objective:    Physical Exam  Constitutional: She is oriented to person, place, and time.  Neck: Muscular tenderness present. No neck rigidity. Edema and decreased range of motion present.  Cardiovascular: Normal rate, regular rhythm, normal heart sounds and intact distal pulses.   Abdominal: Soft. Bowel sounds are normal.  Musculoskeletal:       Cervical  back: She exhibits decreased range of motion, tenderness, pain and spasm.  Neurological: She is alert and oriented to person, place, and time. Gait normal.  Skin: Skin is warm and dry.  Psychiatric: Memory, affect and judgment normal.    Assessment:  BP (!) 106/55 (BP Location: Right Arm, Patient Position: Sitting, Cuff Size: Large)   Pulse (!) 59   Temp 98.5 F (36.9 C) (Oral)   Resp 16   Ht 5' 5.5" (1.664 m)   Wt 271 lb (122.9 kg)   LMP 07/31/2017   SpO2 100%   BMI 44.41 kg/m   Plan:    1. Cervicalgia - ketorolac (TORADOL) injection 60 mg; Inject 2 mLs (60 mg total) into the muscle once. - cyclobenzaprine (FLEXERIL) 5 MG tablet; Take 1 tablet (5 mg total) by mouth 3 (three) times daily as needed for muscle spasms.  Dispense: 30 tablet; Refill: 0 - ibuprofen (ADVIL,MOTRIN) 600 MG tablet; Take 1 tablet (600 mg total) by mouth every 8 (eight) hours as needed.  Dispense: 30 tablet; Refill: 0  2. Acute upper back pain - ketorolac (TORADOL) injection 60 mg; Inject 2 mLs (60 mg total) into the muscle once. - cyclobenzaprine (FLEXERIL) 5 MG tablet; Take 1 tablet (5 mg total) by mouth 3 (three) times daily as needed for muscle spasms.  Dispense: 30 tablet; Refill: 0 - ibuprofen (ADVIL,MOTRIN) 600 MG tablet; Take 1 tablet (600 mg total) by mouth every 8 (eight) hours as needed.  Dispense: 30 tablet; Refill: 0  3. Neck muscle spasm - cyclobenzaprine (FLEXERIL) 5 MG tablet; Take 1 tablet (5 mg total) by mouth 3 (three) times daily as needed for muscle spasms.  Dispense: 30 tablet; Refill: 0 Educational material distributed. Proper lifting, bending technique discussed. Stretching exercises discussed. Ice to affected area as needed for local pain relief. Heat to affected area as needed for local pain relief. NSAIDs per medication orders. Muscle relaxants per medication orders.    The patient was given clear instructions to go to ER or return to medical center if symptoms do not improve,  worsen or new problems develop. The patient verbalized understanding. Will notify patient with laboratory results.  RTC: As needed   Nolon Nations  MSN, FNP-C [facility] [address] [place], [place] [address] [phone]

## 2017-08-05 NOTE — Patient Instructions (Signed)
Neck muscle spasm and back pain:    Cyclobenzaprine 5 mg every 8 hours as needed. Do not drink alcohol, drive, or operate machinery while taking medication Ibuprofen 600 mg every 8 hours as needed for mild to moderate pain    Back Exercises If you have pain in your back, do these exercises 2-3 times each day or as told by your doctor. When the pain goes away, do the exercises once each day, but repeat the steps more times for each exercise (do more repetitions). If you do not have pain in your back, do these exercises once each day or as told by your doctor. Exercises Single Knee to Chest  Do these steps 3-5 times in a row for each leg: 1. Lie on your back on a firm bed or the floor with your legs stretched out. 2. Bring one knee to your chest. 3. Hold your knee to your chest by grabbing your knee or thigh. 4. Pull on your knee until you feel a gentle stretch in your lower back. 5. Keep doing the stretch for 10-30 seconds. 6. Slowly let go of your leg and straighten it.  Pelvic Tilt  Do these steps 5-10 times in a row: 1. Lie on your back on a firm bed or the floor with your legs stretched out. 2. Bend your knees so they point up to the ceiling. Your feet should be flat on the floor. 3. Tighten your lower belly (abdomen) muscles to press your lower back against the floor. This will make your tailbone point up to the ceiling instead of pointing down to your feet or the floor. 4. Stay in this position for 5-10 seconds while you gently tighten your muscles and breathe evenly.  Cat-Cow  Do these steps until your lower back bends more easily: 1. Get on your hands and knees on a firm surface. Keep your hands under your shoulders, and keep your knees under your hips. You may put padding under your knees. 2. Let your head hang down, and make your tailbone point down to the floor so your lower back is round like the back of a cat. 3. Stay in this position for 5 seconds. 4. Slowly lift your  head and make your tailbone point up to the ceiling so your back hangs low (sags) like the back of a cow. 5. Stay in this position for 5 seconds.  Press-Ups  Do these steps 5-10 times in a row: 1. Lie on your belly (face-down) on the floor. 2. Place your hands near your head, about shoulder-width apart. 3. While you keep your back relaxed and keep your hips on the floor, slowly straighten your arms to raise the top half of your body and lift your shoulders. Do not use your back muscles. To make yourself more comfortable, you may change where you place your hands. 4. Stay in this position for 5 seconds. 5. Slowly return to lying flat on the floor.  Bridges  Do these steps 10 times in a row: 1. Lie on your back on a firm surface. 2. Bend your knees so they point up to the ceiling. Your feet should be flat on the floor. 3. Tighten your butt muscles and lift your butt off of the floor until your waist is almost as high as your knees. If you do not feel the muscles working in your butt and the back of your thighs, slide your feet 1-2 inches farther away from your butt. 4. Stay in this  position for 3-5 seconds. 5. Slowly lower your butt to the floor, and let your butt muscles relax.  If this exercise is too easy, try doing it with your arms crossed over your chest. Belly Crunches  Do these steps 5-10 times in a row: 1. Lie on your back on a firm bed or the floor with your legs stretched out. 2. Bend your knees so they point up to the ceiling. Your feet should be flat on the floor. 3. Cross your arms over your chest. 4. Tip your chin a little bit toward your chest but do not bend your neck. 5. Tighten your belly muscles and slowly raise your chest just enough to lift your shoulder blades a tiny bit off of the floor. 6. Slowly lower your chest and your head to the floor.  Back Lifts Do these steps 5-10 times in a row: 1. Lie on your belly (face-down) with your arms at your sides, and rest  your forehead on the floor. 2. Tighten the muscles in your legs and your butt. 3. Slowly lift your chest off of the floor while you keep your hips on the floor. Keep the back of your head in line with the curve in your back. Look at the floor while you do this. 4. Stay in this position for 3-5 seconds. 5. Slowly lower your chest and your face to the floor.  Contact a doctor if:  Your back pain gets a lot worse when you do an exercise.  Your back pain does not lessen 2 hours after you exercise. If you have any of these problems, stop doing the exercises. Do not do them again unless your doctor says it is okay. Get help right away if:  You have sudden, very bad back pain. If this happens, stop doing the exercises. Do not do them again unless your doctor says it is okay. This information is not intended to replace advice given to you by your health care provider. Make sure you discuss any questions you have with your health care provider. Document Released: 11/13/2010 Document Revised: 03/18/2016 Document Reviewed: 12/05/2014 Elsevier Interactive Patient Education  [employer].

## 2017-08-10 ENCOUNTER — Ambulatory Visit (INDEPENDENT_AMBULATORY_CARE_PROVIDER_SITE_OTHER): Payer: Medicaid Other | Admitting: Family Medicine

## 2017-08-10 ENCOUNTER — Encounter: Payer: Self-pay | Admitting: Family Medicine

## 2017-08-10 VITALS — BP 106/75 | HR 61 | Temp 97.8°F | Resp 16 | Ht 65.5 in | Wt 271.0 lb

## 2017-08-10 DIAGNOSIS — M542 Cervicalgia: Secondary | ICD-10-CM | POA: Diagnosis not present

## 2017-08-10 NOTE — Progress Notes (Signed)
Subjective:    Priscilla Delgado is a 36 y.o. female who presents for evaluation of neck and upper back pain. The patient has had prior back pain. Symptoms have been present for greater than 1 week. Patient works a very physical job with repetitive motions. She works at a [employer], [employer] and has to cut chicken repetitively. She was evaluated on 08/05/2017. Patient says that symptoms have improved since that time.  The pain is located primarily in the cervical spine and radiates to the upper back. The pain is described as intermittent and occurs primarily at work. Pain intensity has improved since starting muscle relaxer. She has used OTC Tylenol without sustained relief.    Patient is requesting work form completion on today.  Past Medical History:  Diagnosis Date  . Ulcer of the stomach and intestine    Social History   Social History  . Marital status: Married    Spouse name: N/A  . Number of children: N/A  . Years of education: N/A   Occupational History  . Not on file.   Social History Main Topics  . Smoking status: Never Smoker  . Smokeless tobacco: Never Used  . Alcohol use No  . Drug use: No  . Sexual activity: Not Currently   Other Topics Concern  . Not on file   Social History Narrative  . No narrative on file   Immunization History  Administered Date(s) Administered  . Tdap 01/30/2015   Review of Systems  Constitutional: Negative.   HENT: Negative.   Respiratory: Negative.   Cardiovascular: Negative.   Gastrointestinal: Negative.   Genitourinary: Negative.   Musculoskeletal: Positive for back pain and neck pain.  Neurological: Negative.   Psychiatric/Behavioral: Negative.      Objective:    Physical Exam  Constitutional: She is oriented to person, place, and time.  Neck: Muscular tenderness present. No neck rigidity. Edema and decreased range of motion present.  Cardiovascular: Normal rate, regular rhythm, normal heart sounds and intact distal pulses.    Abdominal: Soft. Bowel sounds are normal.  Musculoskeletal:       Cervical back: She exhibits normal range of motion and no tenderness.       Right hand: She exhibits normal range of motion and no tenderness.       Left hand: She exhibits normal range of motion and no tenderness.  Neurological: She is alert and oriented to person, place, and time. Gait normal.  Skin: Skin is warm and dry.  Psychiatric: Memory, affect and judgment normal.    Assessment:  BP 106/75 (BP Location: Right Arm, Patient Position: Sitting, Cuff Size: Large)   Pulse 61   Temp 97.8 F (36.6 C) (Oral)   Resp 16   Ht 5' 5.5" (1.664 m)   Wt 271 lb (122.9 kg)   LMP 07/31/2017   SpO2 100%   BMI 44.41 kg/m   Plan:     1. Musculoskeletal neck pain Continue Ibuprofen and cyclobenzaprine as prescribed on 08/05/2017 Educational material distributed. Proper lifting, bending technique discussed. Stretching exercises discussed. Ice to affected area as needed for local pain relief. Heat to affected area as needed for local pain relief. NSAIDs per medication orders. Muscle relaxants per medication orders.    The patient was given clear instructions to go to ER or return to medical center if symptoms do not improve, worsen or new problems develop. The patient verbalized understanding. Will notify patient with laboratory results.  RTC: As needed   Nolon NationsLaChina Moore Cyan Moultrie  MSN, FNP-C [facility] [address] [place], [place] [address] [phone]

## 2017-08-10 NOTE — Patient Instructions (Signed)
Patient can return to work without restrictions.

## 2017-11-11 ENCOUNTER — Ambulatory Visit: Payer: Medicaid Other | Admitting: Family Medicine

## 2017-11-25 ENCOUNTER — Encounter (HOSPITAL_COMMUNITY): Payer: Self-pay | Admitting: *Deleted

## 2017-11-25 ENCOUNTER — Emergency Department (HOSPITAL_COMMUNITY)
Admission: EM | Admit: 2017-11-25 | Discharge: 2017-11-25 | Disposition: A | Discharge status: Home / Self Care | Payer: Medicaid Other | Attending: Emergency Medicine | Admitting: Emergency Medicine

## 2017-11-25 ENCOUNTER — Other Ambulatory Visit: Payer: Self-pay

## 2017-11-25 DIAGNOSIS — M549 Dorsalgia, unspecified: Secondary | ICD-10-CM

## 2017-11-25 DIAGNOSIS — R2242 Localized swelling, mass and lump, left lower limb: Secondary | ICD-10-CM | POA: Diagnosis present

## 2017-11-25 DIAGNOSIS — L03314 Cellulitis of groin: Secondary | ICD-10-CM | POA: Diagnosis not present

## 2017-11-25 DIAGNOSIS — M542 Cervicalgia: Secondary | ICD-10-CM

## 2017-11-25 LAB — POC URINE PREG, ED: PREG TEST UR: NEGATIVE

## 2017-11-25 MED ORDER — HYDROCODONE-ACETAMINOPHEN 5-325 MG PO TABS
1.0000 | ORAL_TABLET | Freq: Once | ORAL | Status: AC
  Administered 2017-11-25: 1 via ORAL
  Filled 2017-11-25: qty 1

## 2017-11-25 MED ORDER — IBUPROFEN 600 MG PO TABS: 600.0000 mg | ORAL_TABLET | Freq: Three times a day (TID) | ORAL | 0 refills | Status: DC | PRN

## 2017-11-25 MED ORDER — DOXYCYCLINE HYCLATE 100 MG PO CAPS: 100.0000 mg | ORAL_CAPSULE | Freq: Two times a day (BID) | ORAL | 0 refills | Status: DC

## 2017-11-25 MED ORDER — TETANUS-DIPHTH-ACELL PERTUSSIS 5-2.5-18.5 LF-MCG/0.5 IM SUSP
0.5000 mL | Freq: Once | INTRAMUSCULAR | Status: AC
  Administered 2017-11-25: 0.5 mL via INTRAMUSCULAR
  Filled 2017-11-25: qty 0.5

## 2017-11-25 MED ORDER — LIDOCAINE-EPINEPHRINE (PF) 2 %-1:200000 IJ SOLN
10.0000 mL | Freq: Once | INTRAMUSCULAR | Status: AC
  Administered 2017-11-25: 10 mL via INTRADERMAL
  Filled 2017-11-25: qty 20

## 2017-11-25 NOTE — ED Triage Notes (Signed)
Pt reports having a large abscess to left groin that is causing pain and fever.

## 2017-11-25 NOTE — ED Provider Notes (Signed)
[facility] EMERGENCY DEPARTMENT Provider Note   CSN: [id number] Arrival date & time: 11/25/17  1239     History   Chief Complaint Chief Complaint  Patient presents with  . Abscess    HPI Priscilla Delgado is a 37 y.o. female.  HPI   37 year old female presenting for evaluation of skin infection.  Patient noticed a small painful bump to her left groin that started approximately 3 days ago.  The area has increasing in size, becoming much more painful and red.  Now she is endorse some subjective fever and body aches.  She has been taking Tylenol without adequate relief.  She did recall waxing 5 days ago near the affected area.  She denies any dysuria, hematuria, bowel bladder changes.  She denies any prior history of abscess.  She cannot recall last tetanus status.     Past Medical History:  Diagnosis Date  . Ulcer of the stomach and intestine     Patient Active Problem List   Diagnosis Date Noted  . Acid reflux 10/09/2015  . Constipation 10/09/2015  . Postpartum breast / nipple pain 04/18/2015  . Wound of skin 02/13/2015  . Previous cesarean section complicating pregnancy 01/30/2015    Past Surgical History:  Procedure Laterality Date  . CESAREAN SECTION      OB History    Gravida Para Term Preterm AB Living   3 3 3  0 0 3   SAB TAB Ectopic Multiple Live Births   0 0 0 0 3      Obstetric Comments   C/s for breech- leg was coming down       Home Medications    Prior to Admission medications   Medication Sig Start Date End Date Taking? Authorizing Provider  acetaminophen (TYLENOL) 325 MG tablet Take 2 tablets (650 mg total) by mouth every 6 (six) hours as needed. Patient not taking: Reported on 08/05/2017 06/22/17   Hedges, Tinnie Gens, PA-C  cyclobenzaprine (FLEXERIL) 5 MG tablet Take 1 tablet (5 mg total) by mouth 3 (three) times daily as needed for muscle spasms. 08/05/17   Massie Maroon, FNP  ibuprofen (ADVIL,MOTRIN) 600 MG tablet Take 1  tablet (600 mg total) by mouth every 8 (eight) hours as needed. 08/05/17   Massie Maroon, FNP    Family History History reviewed. No pertinent family history.  Social History Social History   Tobacco Use  . Smoking status: Never Smoker  . Smokeless tobacco: Never Used  Substance Use Topics  . Alcohol use: No  . Drug use: No     Allergies   Patient has no known allergies.   Review of Systems Review of Systems  Constitutional: Positive for fever.  Skin: Positive for rash.  Neurological: Negative for headaches.     Physical Exam Updated Vital Signs BP 120/75 (BP Location: Right Arm)   Pulse 66   Temp 98 F (36.7 C) (Oral)   Resp 16   LMP 11/18/2017   SpO2 100%   Physical Exam  Constitutional: She appears well-developed and well-nourished. No distress.  Obese female nontoxic in appearance  HENT:  Head: Atraumatic.  Eyes: Conjunctivae are normal.  Neck: Neck supple.  Genitourinary:  Genitourinary Comments: Chaperone present during exam.  In the left inguinal area near the mons pubis there is an area of erythema, warmth, and induration approximately 2 x 4 cm in diameter, exquisitely tender to palpation. One small pustular lesion noted near affected area.    Neurological: She  is alert.  Skin: Rash noted.  Psychiatric: She has a normal mood and affect.  Nursing note and vitals reviewed.    ED Treatments / Results  Labs (all labs ordered are listed, but only abnormal results are displayed) Labs Reviewed  POC URINE PREG, ED    EKG  EKG Interpretation None       Radiology No results found.  Procedures .Marland Kitchen.Incision and Drainage Date/Time: 11/25/2017 5:48 PM Performed by: Fayrene Helperran, Tara Rud, PA-C Authorized by: Fayrene Helperran, Westlyn Glaza, PA-C   Consent:    Consent obtained:  Verbal   Consent given by:  Patient   Risks discussed:  Incomplete drainage, bleeding and damage to other organs   Alternatives discussed:  Alternative treatment Location:    Type:  Abscess    Size:  2x3cm   Location:  Anogenital   Anogenital location: left inguinal region. Pre-procedure details:    Skin preparation:  Betadine Anesthesia (see MAR for exact dosages):    Anesthesia method:  Local infiltration   Local anesthetic:  Lidocaine 2% WITH epi Procedure type:    Complexity:  Simple Post-procedure details:    Patient tolerance of procedure:  Procedure terminated at patient's request Comments:     Patient did not tolerates anesthetic inject, procedure was terminated at patient's request.   (including critical care time)  Medications Ordered in ED Medications  Tdap (BOOSTRIX) injection 0.5 mL (0.5 mLs Intramuscular Given 11/25/17 1650)  lidocaine-EPINEPHrine (XYLOCAINE W/EPI) 2 %-1:200000 (PF) injection 10 mL (10 mLs Intradermal Given by Other 11/25/17 1649)  HYDROcodone-acetaminophen (NORCO/VICODIN) 5-325 MG per tablet 1 tablet (1 tablet Oral Given 11/25/17 1649)     Initial Impression / Assessment and Plan / ED Course  I have reviewed the triage vital signs and the nursing notes.  Pertinent labs & imaging results that were available during my care of the patient were reviewed by me and considered in my medical decision making (see chart for details).     BP 122/76   Pulse 68   Temp 98 F (36.7 C) (Oral)   Resp 16   LMP 11/18/2017   SpO2 100%    Final Clinical Impressions(s) / ED Diagnoses   Final diagnoses:  Cellulitis of groin, left    ED Discharge Orders        Ordered    ibuprofen (ADVIL,MOTRIN) 600 MG tablet  Every 8 hours PRN     11/25/17 1754    doxycycline (VIBRAMYCIN) 100 MG capsule  2 times daily     11/25/17 1754     4:41 PM Pt presenting with pain to L groin, near mon pupis which appears to be cellulitis with a potential abscess.  Will perform I&D.  Pain medication and dtap given.  Will check pregnancy test.   5:52 PM Pregnancy test negative.  However, pt did not tolerates injection of anesthetic agent and therefore the procedure was  terminated prior to I&D being performed.  Pt made aware that her condition may worsen if abscess increases in size without I&D, and that she may need to return for further management.  Pt d/c home with pain medication and antibiotic.  Recommend warm compress and to return in 48 hrs for recheck.     Fayrene Helperran, Tamecia Mcdougald, PA-C 11/25/17 1755    Rolan BuccoBelfi, Melanie, MD 11/25/17 [phone]

## 2017-11-25 NOTE — Discharge Instructions (Signed)
You have a skin infection and a potential developing abscess involving your left groin.  We were unable to perform an incision and drainage procedure to help aid with healing.  Please apply warm compress to affected area several times a day.  Take antibiotic as prescribed.  If you notice no improvement in 48 hrs, please return for further care.

## 2017-11-25 NOTE — ED Notes (Signed)
POC urine preg confirmed negative by mini lab prior to tetanus administration

## 2017-12-29 ENCOUNTER — Other Ambulatory Visit: Payer: Self-pay

## 2017-12-29 ENCOUNTER — Ambulatory Visit (HOSPITAL_COMMUNITY)
Admission: EM | Admit: 2017-12-29 | Discharge: 2017-12-29 | Disposition: A | Discharge status: Home / Self Care | Payer: Medicaid Other | Attending: Family Medicine | Admitting: Family Medicine

## 2017-12-29 ENCOUNTER — Encounter (HOSPITAL_COMMUNITY): Payer: Self-pay | Admitting: Family Medicine

## 2017-12-29 DIAGNOSIS — R42 Dizziness and giddiness: Secondary | ICD-10-CM

## 2017-12-29 DIAGNOSIS — Z3202 Encounter for pregnancy test, result negative: Secondary | ICD-10-CM

## 2017-12-29 LAB — POCT URINALYSIS DIP (DEVICE)
Bilirubin Urine: NEGATIVE
Glucose, UA: NEGATIVE mg/dL
KETONES UR: NEGATIVE mg/dL
LEUKOCYTES UA: NEGATIVE
Nitrite: NEGATIVE
PROTEIN: NEGATIVE mg/dL
SPECIFIC GRAVITY, URINE: 1.015 (ref 1.005–1.030)
Urobilinogen, UA: 1 mg/dL (ref 0.0–1.0)
pH: 7.5 (ref 5.0–8.0)

## 2017-12-29 LAB — POCT PREGNANCY, URINE: Preg Test, Ur: NEGATIVE

## 2017-12-29 NOTE — ED Triage Notes (Signed)
Pt here for pregnancy test only. Denies symptoms.

## 2018-01-02 NOTE — ED Provider Notes (Signed)
  [facility]   [id number] 12/29/17 Arrival Time: 1942  ASSESSMENT & PLAN:  1. Pregnancy examination or test, negative result    UPT negative. She is relieved. May f/u as needed.  Reviewed expectations re: course of current medical issues. Questions answered. Outlined signs and symptoms indicating need for more acute intervention. Patient verbalized understanding. After Visit Summary given.   SUBJECTIVE:  Priscilla Delgado is a 37 y.o. female who requests a pregnancy test today. Patient's last menstrual period was 12/04/2017. Reports intermittent "kind of a lightheaded feeling" over the past few weeks; slight and transient. Otherwise feeling well and normal. No urinary symptoms or abdominal/pelvic pain.  ROS: As per HPI.   OBJECTIVE:  Vitals:   12/29/17 1950  BP: (!) 115/48  Pulse: 73  Resp: 18  Temp: 97.9 F (36.6 C)  SpO2: 100%    General appearance: alert; no distress Lungs: clear to auscultation bilaterally Heart: regular rate and rhythm Abdomen: soft, non-tender; bowel sounds normal Skin: warm and dry Psychological: alert and cooperative; normal mood and affect  Investigations:  Results for orders placed or performed during the hospital encounter of 12/29/17  POCT urinalysis dip (device)  Result Value Ref Range   Glucose, UA NEGATIVE NEGATIVE mg/dL   Bilirubin Urine NEGATIVE NEGATIVE   Ketones, ur NEGATIVE NEGATIVE mg/dL   Specific Gravity, Urine 1.015 1.005 - 1.030   Hgb urine dipstick TRACE (A) NEGATIVE   pH 7.5 5.0 - 8.0   Protein, ur NEGATIVE NEGATIVE mg/dL   Urobilinogen, UA 1.0 0.0 - 1.0 mg/dL   Nitrite NEGATIVE NEGATIVE   Leukocytes, UA NEGATIVE NEGATIVE  Pregnancy, urine POC  Result Value Ref Range   Preg Test, Ur NEGATIVE NEGATIVE    No Known Allergies  Past Medical History:  Diagnosis Date  . Ulcer of the stomach and intestine    Social History   Socioeconomic History  . Marital status: Married    Spouse name: Not on file    . Number of children: Not on file  . Years of education: Not on file  . Highest education level: Not on file  Social Needs  . Financial resource strain: Not on file  . Food insecurity - worry: Not on file  . Food insecurity - inability: Not on file  . Transportation needs - medical: Not on file  . Transportation needs - non-medical: Not on file  Occupational History  . Not on file  Tobacco Use  . Smoking status: Never Smoker  . Smokeless tobacco: Never Used  Substance and Sexual Activity  . Alcohol use: No  . Drug use: No  . Sexual activity: Not Currently  Other Topics Concern  . Not on file  Social History Narrative  . Not on file    Past Surgical History:  Procedure Laterality Date  . CESAREAN SECTION        Mardella LaymanHagler, Evva Din, MD 01/02/18 [phone]

## 2018-02-07 DIAGNOSIS — H5203 Hypermetropia, bilateral: Secondary | ICD-10-CM | POA: Diagnosis not present

## 2018-09-11 ENCOUNTER — Encounter (HOSPITAL_COMMUNITY): Payer: Self-pay | Admitting: Emergency Medicine

## 2018-09-11 ENCOUNTER — Ambulatory Visit (HOSPITAL_COMMUNITY)
Admission: EM | Admit: 2018-09-11 | Discharge: 2018-09-11 | Disposition: A | Discharge status: Home / Self Care | Payer: Medicaid Other | Attending: Family Medicine | Admitting: Family Medicine

## 2018-09-11 ENCOUNTER — Other Ambulatory Visit: Payer: Self-pay

## 2018-09-11 DIAGNOSIS — M545 Low back pain, unspecified: Secondary | ICD-10-CM

## 2018-09-11 DIAGNOSIS — Z3201 Encounter for pregnancy test, result positive: Secondary | ICD-10-CM | POA: Diagnosis not present

## 2018-09-11 DIAGNOSIS — O26891 Other specified pregnancy related conditions, first trimester: Secondary | ICD-10-CM

## 2018-09-11 LAB — POCT URINALYSIS DIP (DEVICE)
BILIRUBIN URINE: NEGATIVE
GLUCOSE, UA: NEGATIVE mg/dL
Hgb urine dipstick: NEGATIVE
Ketones, ur: NEGATIVE mg/dL
NITRITE: NEGATIVE
Protein, ur: NEGATIVE mg/dL
Specific Gravity, Urine: 1.015 (ref 1.005–1.030)
Urobilinogen, UA: 1 mg/dL (ref 0.0–1.0)
pH: 7.5 (ref 5.0–8.0)

## 2018-09-11 LAB — POCT PREGNANCY, URINE: Preg Test, Ur: POSITIVE — AB

## 2018-09-11 MED ORDER — ACETAMINOPHEN 500 MG PO TABS: 1000.0000 mg | ORAL_TABLET | Freq: Four times a day (QID) | ORAL | 0 refills | Status: DC | PRN

## 2018-09-11 MED ORDER — PRENATAL COMPLETE 14-0.4 MG PO TABS: 1.0000 | ORAL_TABLET | Freq: Every day | ORAL | 0 refills | Status: DC

## 2018-09-11 NOTE — Discharge Instructions (Addendum)
Take prenatal vitamin every day Take acetaminophen as needed for back pain Limit bending and lifting while back is painful Follow-up with women's health regarding your pregnancy

## 2018-09-11 NOTE — ED Provider Notes (Signed)
[facility]    CSN: [id number] Arrival date & time: 09/11/18  1036     History   Chief Complaint Chief Complaint  Patient presents with  . Possible Pregnancy    HPI Priscilla Delgado is a 37 y.o. female.   HPI  Home pregnancy test Is positive.  Needs doctor confirmation for Medicaid/social service. This isvher 4th child.  She has a OB/GYN discussed health during early pregnancy - foods - medicines - alcohol and tobacco avoidance Also has acute on chronic low back pain.  No radiation.  No injury.  All across low back.  Wants to know what to take.  No urineary symptoms    Past Medical History:  Diagnosis Date  . Ulcer of the stomach and intestine     Patient Active Problem List   Diagnosis Date Noted  . Acid reflux 10/09/2015  . Constipation 10/09/2015  . Postpartum breast / nipple pain 04/18/2015  . Wound of skin 02/13/2015  . Previous cesarean section complicating pregnancy 01/30/2015    Past Surgical History:  Procedure Laterality Date  . CESAREAN SECTION      OB History    Gravida  3   Para  3   Term  3   Preterm  0   AB  0   Living  3     SAB  0   TAB  0   Ectopic  0   Multiple  0   Live Births  3        Obstetric Comments  C/s for breech- leg was coming down         Home Medications    Prior to Admission medications   Medication Sig Start Date End Date Taking? Authorizing Provider  acetaminophen (TYLENOL) 500 MG tablet Take 2 tablets (1,000 mg total) by mouth every 6 (six) hours as needed for moderate pain. 09/11/18   Eustace Moore, MD  Prenatal Vit-Fe Fumarate-FA (PRENATAL COMPLETE) 14-0.4 MG TABS Take 1 tablet by mouth daily. 09/11/18   Eustace Moore, MD    Family History History reviewed. No pertinent family history.  Social History Social History   Tobacco Use  . Smoking status: Never Smoker  . Smokeless tobacco: Never Used  Substance Use Topics  . Alcohol use: No  . Drug use: No      Allergies   Patient has no known allergies.   Review of Systems Review of Systems  Constitutional: Negative for chills and fever.  HENT: Negative for ear pain and sore throat.   Eyes: Negative for pain and visual disturbance.  Respiratory: Negative for cough and shortness of breath.   Cardiovascular: Negative for chest pain and palpitations.  Gastrointestinal: Negative for abdominal pain and vomiting.  Genitourinary: Positive for menstrual problem. Negative for dysuria and hematuria.       Amenorrhea  Musculoskeletal: Positive for back pain. Negative for arthralgias.  Skin: Negative for color change and rash.  Neurological: Negative for seizures, syncope, weakness, numbness and headaches.  Psychiatric/Behavioral: Negative for dysphoric mood. The patient is not nervous/anxious.   All other systems reviewed and are negative.    Physical Exam Triage Vital Signs ED Triage Vitals  Enc Vitals Group     BP 09/11/18 1124 (!) 98/50     Pulse Rate 09/11/18 1124 63     Resp --      Temp 09/11/18 1124 98 F (36.7 C)     Temp Source 09/11/18 1124 Oral     SpO2  09/11/18 1124 100 %     Weight --      Height --      Head Circumference --      Peak Flow --      Pain Score 09/11/18 1125 6     Pain Loc --      Pain Edu? --      Excl. in GC? --    No data found.  Updated Vital Signs BP (!) 98/50 (BP Location: Left Arm)   Pulse 63   Temp 98 F (36.7 C) (Oral)   LMP 05/27/2018 (Exact Date)   SpO2 100%       Physical Exam  Constitutional: She appears well-developed and well-nourished. No distress.  HENT:  Head: Normocephalic and atraumatic.  Mouth/Throat: Oropharynx is clear and moist.  Eyes: Pupils are equal, round, and reactive to light. Conjunctivae are normal.  Neck: Normal range of motion.  Cardiovascular: Normal rate.  Pulmonary/Chest: Effort normal. No respiratory distress.  Abdominal: Soft. She exhibits no distension.  Musculoskeletal: Normal range of motion.  She exhibits no edema.  Lumbar spine is straight and symmetric. Full range of motion. No tenderness or muscle spasm. Strength, sensation, range of motion, and reflexes are normal in both lower extremities. Straight leg raise is negative bilateral.   Neurological: She is alert.  Skin: Skin is warm and dry.     UC Treatments / Results  Labs (all labs ordered are listed, but only abnormal results are displayed) Labs Reviewed  POCT URINALYSIS DIP (DEVICE) - Abnormal; Notable for the following components:      Result Value   Leukocytes, UA SMALL (*)    All other components within normal limits  POCT PREGNANCY, URINE - Abnormal; Notable for the following components:   Preg Test, Ur POSITIVE (*)    All other components within normal limits    EKG None  Radiology No results found.  Procedures Procedures (including critical care time)  Medications Ordered in UC Medications - No data to display  Initial Impression / Assessment and Plan / UC Course  I have reviewed the triage vital signs and the nursing notes.  Pertinent labs & imaging results that were available during my care of the patient were reviewed by me and considered in my medical decision making (see chart for details).  Clinical Course as of Sep 11 1237  Mon Sep 11, 2018  1140 POCT Pregnancy, Urine [YN]    Clinical Course User Index [YN] Eustace MooreNelson, Boluwatife Flight Sue, MD     Final Clinical Impressions(s) / UC Diagnoses   Final diagnoses:  Positive pregnancy test  Low back pain during pregnancy in first trimester     Discharge Instructions     Take prenatal vitamin every day Take acetaminophen as needed for back pain Limit bending and lifting while back is painful Follow-up with women's health regarding your pregnancy    ED Prescriptions    Medication Sig Dispense Auth. Provider   Prenatal Vit-Fe Fumarate-FA (PRENATAL COMPLETE) 14-0.4 MG TABS Take 1 tablet by mouth daily. 60 each Eustace MooreNelson, Sender Rueb Sue, MD    acetaminophen (TYLENOL) 500 MG tablet Take 2 tablets (1,000 mg total) by mouth every 6 (six) hours as needed for moderate pain. 30 tablet Eustace MooreNelson, Kerie Badger Sue, MD     Controlled Substance Prescriptions [place] Controlled Substance Registry consulted? Not Applicable   Eustace MooreNelson, Zarielle Cea Sue, MD 09/11/18 1239

## 2018-10-10 ENCOUNTER — Emergency Department (HOSPITAL_COMMUNITY)
Admission: EM | Admit: 2018-10-10 | Discharge: 2018-10-10 | Disposition: A | Discharge status: Home / Self Care | Payer: Medicaid Other | Attending: Emergency Medicine | Admitting: Emergency Medicine

## 2018-10-10 ENCOUNTER — Emergency Department (HOSPITAL_COMMUNITY): Payer: Medicaid Other

## 2018-10-10 ENCOUNTER — Encounter (HOSPITAL_COMMUNITY): Payer: Self-pay

## 2018-10-10 DIAGNOSIS — Y929 Unspecified place or not applicable: Secondary | ICD-10-CM | POA: Diagnosis not present

## 2018-10-10 DIAGNOSIS — O9A212 Injury, poisoning and certain other consequences of external causes complicating pregnancy, second trimester: Secondary | ICD-10-CM | POA: Diagnosis not present

## 2018-10-10 DIAGNOSIS — M5489 Other dorsalgia: Secondary | ICD-10-CM | POA: Insufficient documentation

## 2018-10-10 DIAGNOSIS — R1033 Periumbilical pain: Secondary | ICD-10-CM | POA: Diagnosis not present

## 2018-10-10 DIAGNOSIS — X58XXXA Exposure to other specified factors, initial encounter: Secondary | ICD-10-CM | POA: Insufficient documentation

## 2018-10-10 DIAGNOSIS — O26892 Other specified pregnancy related conditions, second trimester: Secondary | ICD-10-CM | POA: Diagnosis not present

## 2018-10-10 DIAGNOSIS — S39012A Strain of muscle, fascia and tendon of lower back, initial encounter: Secondary | ICD-10-CM | POA: Insufficient documentation

## 2018-10-10 DIAGNOSIS — Y999 Unspecified external cause status: Secondary | ICD-10-CM | POA: Insufficient documentation

## 2018-10-10 DIAGNOSIS — O9989 Other specified diseases and conditions complicating pregnancy, childbirth and the puerperium: Secondary | ICD-10-CM | POA: Diagnosis not present

## 2018-10-10 DIAGNOSIS — Z3A20 20 weeks gestation of pregnancy: Secondary | ICD-10-CM | POA: Insufficient documentation

## 2018-10-10 DIAGNOSIS — Y939 Activity, unspecified: Secondary | ICD-10-CM | POA: Insufficient documentation

## 2018-10-10 DIAGNOSIS — R109 Unspecified abdominal pain: Secondary | ICD-10-CM | POA: Diagnosis not present

## 2018-10-10 LAB — COMPREHENSIVE METABOLIC PANEL
ALBUMIN: 3.2 g/dL — AB (ref 3.5–5.0)
ALT: 13 U/L (ref 0–44)
ANION GAP: 9 (ref 5–15)
AST: 16 U/L (ref 15–41)
Alkaline Phosphatase: 56 U/L (ref 38–126)
BUN: 10 mg/dL (ref 6–20)
CHLORIDE: 105 mmol/L (ref 98–111)
CO2: 24 mmol/L (ref 22–32)
Calcium: 9 mg/dL (ref 8.9–10.3)
Creatinine, Ser: 0.7 mg/dL (ref 0.44–1.00)
GFR calc Af Amer: >60 mL/min (ref >60)
GFR calc non Af Amer: >60 mL/min (ref >60)
GLUCOSE: 101 mg/dL — AB (ref 70–99)
Potassium: 3.8 mmol/L (ref 3.5–5.1)
SODIUM: 138 mmol/L (ref 135–145)
Total Bilirubin: 0.5 mg/dL (ref 0.3–1.2)
Total Protein: 6.6 g/dL (ref 6.5–8.1)

## 2018-10-10 LAB — URINALYSIS, ROUTINE W REFLEX MICROSCOPIC
Bilirubin Urine: NEGATIVE
Glucose, UA: NEGATIVE mg/dL
Hgb urine dipstick: NEGATIVE
Ketones, ur: NEGATIVE mg/dL
Leukocytes, UA: NEGATIVE
Nitrite: NEGATIVE
PH: 5 (ref 5.0–8.0)
Protein, ur: NEGATIVE mg/dL
SPECIFIC GRAVITY, URINE: 1.028 (ref 1.005–1.030)

## 2018-10-10 LAB — I-STAT BETA HCG BLOOD, ED (MC, WL, AP ONLY)

## 2018-10-10 LAB — CBC
HEMATOCRIT: 37.2 % (ref 36.0–46.0)
HEMOGLOBIN: 12.4 g/dL (ref 12.0–15.0)
MCH: 30.4 pg (ref 26.0–34.0)
MCHC: 33.3 g/dL (ref 30.0–36.0)
MCV: 91.2 fL (ref 80.0–100.0)
Platelets: 195 10*3/uL (ref 150–400)
RBC: 4.08 MIL/uL (ref 3.87–5.11)
RDW: 12.9 % (ref 11.5–15.5)
WBC: 7.6 10*3/uL (ref 4.0–10.5)
nRBC: 0 % (ref 0.0–0.2)

## 2018-10-10 LAB — LIPASE, BLOOD: LIPASE: 25 U/L (ref 11–51)

## 2018-10-10 NOTE — ED Notes (Signed)
Patient verbalizes understanding of discharge instructions. Opportunity for questioning and answers were provided. Armband removed by staff, pt discharged from ED ambulatory.   

## 2018-10-10 NOTE — ED Provider Notes (Signed)
[facility] EMERGENCY DEPARTMENT Provider Note   CSN: [id number] Arrival date & time: 10/10/18  1859     History   Chief Complaint Chief Complaint  Patient presents with  . Abdominal Pain  . Back Pain    HPI Priscilla Delgado is a 37 y.o. female with a past medical history of GERD, currently pregnant LMP 05/27/2018 who presents to ED for periumbilical abdominal pain for the past week.  She states that the symptoms worsened today.  Denies any nausea, vomiting, changes to bowel movements, fever, dysuria, vaginal discharge, abnormal bleeding. She also reports back pain.  This is located in the mid back.  States that she is had similar symptoms in her mid and lower back due to work which usually improve with Tylenol.  She has been taking Tylenol with improvement in her pain.  Denies any injuries or falls, prior back surgeries, chest pain, shortness of breath, cough, leg swelling.   HPI  Past Medical History:  Diagnosis Date  . Ulcer of the stomach and intestine     Patient Active Problem List   Diagnosis Date Noted  . Acid reflux 10/09/2015  . Constipation 10/09/2015  . Postpartum breast / nipple pain 04/18/2015  . Wound of skin 02/13/2015  . Previous cesarean section complicating pregnancy 01/30/2015    Past Surgical History:  Procedure Laterality Date  . CESAREAN SECTION       OB History    Gravida  3   Para  3   Term  3   Preterm  0   AB  0   Living  3     SAB  0   TAB  0   Ectopic  0   Multiple  0   Live Births  3        Obstetric Comments  C/s for breech- leg was coming down         Home Medications    Prior to Admission medications   Medication Sig Start Date End Date Taking? Authorizing Provider  acetaminophen (TYLENOL) 500 MG tablet Take 2 tablets (1,000 mg total) by mouth every 6 (six) hours as needed for moderate pain. Patient not taking: Reported on 10/10/2018 09/11/18   Eustace Moore, MD  Prenatal Vit-Fe  Fumarate-FA (PRENATAL COMPLETE) 14-0.4 MG TABS Take 1 tablet by mouth daily. Patient not taking: Reported on 10/10/2018 09/11/18   Eustace Moore, MD    Family History No family history on file.  Social History Social History   Tobacco Use  . Smoking status: Never Smoker  . Smokeless tobacco: Never Used  Substance Use Topics  . Alcohol use: No  . Drug use: No     Allergies   Patient has no known allergies.   Review of Systems Review of Systems  Constitutional: Negative for appetite change, chills and fever.  HENT: Negative for ear pain, rhinorrhea, sneezing and sore throat.   Eyes: Negative for photophobia and visual disturbance.  Respiratory: Negative for cough, chest tightness, shortness of breath and wheezing.   Cardiovascular: Negative for chest pain and palpitations.  Gastrointestinal: Positive for abdominal pain. Negative for blood in stool, constipation, diarrhea, nausea and vomiting.  Genitourinary: Negative for dysuria, hematuria and urgency.  Musculoskeletal: Positive for back pain. Negative for myalgias.  Skin: Negative for rash.  Neurological: Negative for dizziness, weakness and light-headedness.     Physical Exam Updated Vital Signs BP 112/78 (BP Location: Right Arm)   Pulse 74   Temp 98.6 F (  37 C) (Oral)   Resp 18   SpO2 100%   Physical Exam Vitals signs and nursing note reviewed.  Constitutional:      General: She is not in acute distress.    Appearance: She is well-developed.  HENT:     Head: Normocephalic and atraumatic.     Nose: Nose normal.  Eyes:     General: No scleral icterus.       Left eye: No discharge.     Conjunctiva/sclera: Conjunctivae normal.  Neck:     Musculoskeletal: Normal range of motion and neck supple.  Cardiovascular:     Rate and Rhythm: Normal rate and regular rhythm.     Heart sounds: Normal heart sounds. No murmur. No friction rub. No gallop.   Pulmonary:     Effort: Pulmonary effort is normal. No  respiratory distress.     Breath sounds: Normal breath sounds.  Abdominal:     General: Bowel sounds are normal. There is no distension.     Palpations: Abdomen is soft.     Tenderness: There is abdominal tenderness in the periumbilical area. There is no guarding or rebound.  Musculoskeletal: Normal range of motion.        General: Tenderness present.       Back:     Comments: No step-off palpated. No visible bruising, edema or temperature change noted. No objective signs of numbness present. No saddle anesthesia. 2+ DP pulses bilaterally. Sensation intact to light touch. Strength 5/5 in bilateral lower extremities.  Skin:    General: Skin is warm and dry.     Findings: No rash.  Neurological:     Mental Status: She is alert.     Motor: No abnormal muscle tone.     Coordination: Coordination normal.      ED Treatments / Results  Labs (all labs ordered are listed, but only abnormal results are displayed) Labs Reviewed  COMPREHENSIVE METABOLIC PANEL - Abnormal; Notable for the following components:      Result Value   Glucose, Bld 101 (*)    Albumin 3.2 (*)    All other components within normal limits  URINALYSIS, ROUTINE W REFLEX MICROSCOPIC - Abnormal; Notable for the following components:   APPearance HAZY (*)    All other components within normal limits  I-STAT BETA HCG BLOOD, ED ([facility], [facility], [facility] ONLY) - Abnormal; Notable for the following components:   I-stat hCG, quantitative >2,000.0 (*)    All other components within normal limits  LIPASE, BLOOD  CBC    EKG EKG Interpretation  Date/Time:  Tuesday October 10 2018 20:52:36 EST Ventricular Rate:  70 PR Interval:    QRS Duration: 101 QT Interval:  411 QTC Calculation: 444 R Axis:   63 Text Interpretation:  Sinus rhythm No significant change since last tracing Confirmed by Gwyneth SproutPlunkett, Whitney ([id number]) on 10/10/2018 9:35:14 PM   Radiology [place] Ob Limited > 14 Wks  Result Date: 10/10/2018 CLINICAL DATA:  Abdominal  pain. EXAM: LIMITED OBSTETRIC ULTRASOUND FINDINGS: Number of Fetuses: 1 Heart Rate:  165 bpm Movement: Yes Presentation: Cephalic Placental Location: Anterior Previa: No Amniotic Fluid (Subjective):  Within normal limits. BPD: 4.85 cm 20 w  5 d MATERNAL FINDINGS: Cervix:  Appears closed. Uterus/Adnexae: Ovaries not visualized. IMPRESSION: No acute abnormality. This exam is performed on an emergent basis and does not comprehensively evaluate fetal size, dating, or anatomy; follow-up complete OB US should be considered if further fetal assessment is warranted. Electronically Signed   By: Chrissie NoaWilliam  Howell Pringle M.D.   On: 10/10/2018 22:43    Procedures Procedures (including critical care time)  Medications Ordered in ED Medications - No data to display   Initial Impression / Assessment and Plan / ED Course  I have reviewed the triage vital signs and the nursing notes.  Pertinent labs & imaging results that were available during my care of the patient were reviewed by me and considered in my medical decision making (see chart for details).     37 year old female currently pregnant with LMP of August presents to ED for periumbilical abdominal pain for the past week.  She also reports mid and lower back pain.  She is had this back pain before but worse with certain movements.  She is taking Tylenol with improvement.  Patient has not yet received prenatal care with an OB/GYN for this pregnancy.  She has not had an ultrasound.  She denies any other vaginal complaints, dysuria, changes to bowel movements or fever.  On exam there is mild periumbilical tenderness to palpation without rebound or guarding.  No right lower quadrant tenderness to palpation.  No CVA tenderness.  She is overall well-appearing.  Lab work significant for hCG greater than 2000, CBC, urinalysis and CMP unremarkable.  Pelvic ultrasound shows single IUP.  EKG shows sinus rhythm with no significant changes since prior tracings.  Her back pain is  reproducible with palpation.  I suspect that this is musculoskeletal in nature.  I doubt that her abdominal pain is surgical or emergent in nature based on her unremarkable and reassuring lab work, symptoms have been going on for 1 week.  I did encourage her to continue Tylenol as needed and gave her follow-up with OB/GYN to receive prenatal care.  She was also given strict return precautions for any severe worsening symptoms.  Patient is agreeable to this plan.  Patient is hemodynamically stable, in NAD, and able to ambulate in the ED. Evaluation does not show pathology that would require ongoing emergent intervention or inpatient treatment. I explained the diagnosis to the patient. Pain has been managed and has no complaints prior to discharge. Patient is comfortable with above plan and is stable for discharge at this time. All questions were answered prior to disposition. Strict return precautions for returning to the ED were discussed. Encouraged follow up with PCP.    Portions of this note were generated with Scientist, clinical (histocompatibility and immunogenetics). Dictation errors may occur despite best attempts at proofreading.   Final Clinical Impressions(s) / ED Diagnoses   Final diagnoses:  Strain of lumbar region, initial encounter    ED Discharge Orders    None       Dietrich Pates, PA-C 10/10/18 [id number]    Gwyneth Sprout, MD 10/11/18 [id number]

## 2018-10-10 NOTE — ED Triage Notes (Signed)
Pt c.o umbilical pain for 1 week and upper back pain that started today. Pt denies nausea or vomiting, no urinary s/s

## 2018-10-10 NOTE — ED Notes (Signed)
Patient transported to Ultrasound 

## 2018-10-10 NOTE — Discharge Instructions (Signed)
Take Tylenol as needed for back pain. Follow-up with the OB/GYN office listed below to receive more prenatal care. Return to ED for worsening symptoms, chest pain, shortness of breath, abnormal vaginal bleeding or severe abdominal pain.

## 2018-10-25 NOTE — L&D Delivery Note (Addendum)
LABOR COURSE Patient was admitted in active labor. She was augmented with AROM and progressed quickly without further intervention.  Delivery Note Called to room and patient was complete and pushing. Head delivered LOT. Loose nuchal cord present, reduced easily with somersault maneuver. Shoulder and body delivered in usual fashion. At 2007 a viable  female was delivered via Vaginal Birth After Cesarean (Presentation:LOT;LOA  ).  Infant with spontaneous cry, placed on mother's abdomen, dried and stimulated. Cord clamped x 2 after two-minute delay, and cut by patient's mother. Cord blood drawn. Placenta delivered spontaneously with gentle cord traction. Appears intact. Fundus firm with massage and Pitocin. Labia, perineum, vagina, and cervix inspected inspected with no lacerations noted.    APGAR:8 , 9; weight 3435g .   Cord: 3VC with the following complications:none.     Anesthesia: None  Episiotomy: None Lacerations: None Est. Blood Loss (mL): 100  Mom to postpartum.  Baby to Couplet care / Skin to Skin.  Clinic messaged to schedule postpartum appointment in 4-6 weeks. Patient does not desire contraception.  Clayton Bibles, [place] 02/19/19  10:03 PM

## 2018-11-08 ENCOUNTER — Ambulatory Visit (INDEPENDENT_AMBULATORY_CARE_PROVIDER_SITE_OTHER): Payer: Medicaid Other | Admitting: Family Medicine

## 2018-11-08 ENCOUNTER — Encounter: Payer: Self-pay | Admitting: Family Medicine

## 2018-11-08 VITALS — BP 103/53 | HR 75 | Temp 97.9°F | Resp 14 | Ht 65.0 in | Wt 274.0 lb

## 2018-11-08 DIAGNOSIS — M5440 Lumbago with sciatica, unspecified side: Secondary | ICD-10-CM

## 2018-11-08 DIAGNOSIS — M546 Pain in thoracic spine: Secondary | ICD-10-CM | POA: Diagnosis not present

## 2018-11-08 LAB — POCT URINALYSIS DIPSTICK
Bilirubin, UA: NEGATIVE
Blood, UA: NEGATIVE
Glucose, UA: NEGATIVE
Ketones, UA: NEGATIVE
Leukocytes, UA: NEGATIVE
Nitrite, UA: NEGATIVE
Protein, UA: NEGATIVE
Spec Grav, UA: 1.01 (ref 1.010–1.025)
Urobilinogen, UA: 0.2 E.U./dL
pH, UA: 6.5 (ref 5.0–8.0)

## 2018-11-08 NOTE — Progress Notes (Signed)
Subjective:    Priscilla Delgado is a 38 y.o. female who presents low back problems. Current symptoms include: pain in mid and lower back.  (sharp in character; 6/10 in severity). Symptoms have not changed from the previous visit. Exacerbating factors identified by the patient are standing and walking.  The following portions of the patient's history were reviewed and updated as appropriate: allergies, current medications, past family history, past medical history, past social history, past surgical history and problem list.    Objective:    BP (!) 103/53 (BP Location: Left Arm, Patient Position: Sitting, Cuff Size: Large)   Pulse 75   Temp 97.9 F (36.6 C) (Oral)   Resp 14   Ht 5\' 5"  (1.651 m)   Wt 274 lb (124.3 kg)   LMP 05/27/2018 (Exact Date) Comment: unsure of due date   BMI 45.60 kg/m  General appearance: alert, cooperative and no distress Head: Normocephalic, without obvious abnormality, atraumatic Neck: no adenopathy, no carotid bruit, no JVD, supple, symmetrical, trachea midline and thyroid not enlarged, symmetric, no tenderness/mass/nodules Back: range of motion normal, lordosis. no spasms. tenderness with palpation of the lower back L>R Lungs: clear to auscultation bilaterally Heart: regular rate and rhythm, S1, S2 normal, no murmur, click, rub or gallop Extremities: extremities normal, atraumatic, no cyanosis or edema Neurologic: Alert and oriented X 3, normal strength and tone. Normal symmetric reflexes. Normal coordination and gait    Assessment:    Nonspecific acute low back pain    Plan:    Natural history and expected course discussed. Questions answered. Agricultural engineer distributed. Proper lifting, bending technique discussed. Stretching exercises discussed. Regular aerobic and trunk strengthening exercises discussed. Ice to affected area as needed for local pain relief. Heat to affected area as needed for local pain relief. OTC analgesics as needed.   RTC  PRN.  Note written for patient to sit while working.

## 2018-11-08 NOTE — Patient Instructions (Signed)

## 2018-11-16 ENCOUNTER — Ambulatory Visit (INDEPENDENT_AMBULATORY_CARE_PROVIDER_SITE_OTHER): Payer: Medicaid Other | Admitting: Family Medicine

## 2018-11-16 ENCOUNTER — Encounter: Payer: Self-pay | Admitting: Family Medicine

## 2018-11-16 VITALS — BP 93/48 | HR 70 | Temp 97.8°F | Resp 16 | Ht 65.0 in | Wt 273.0 lb

## 2018-11-16 DIAGNOSIS — M546 Pain in thoracic spine: Secondary | ICD-10-CM | POA: Diagnosis not present

## 2018-11-16 NOTE — Progress Notes (Signed)
  [facility]   Progress Note: General Provider: Mike Gip, FNP  SUBJECTIVE:   Priscilla Delgado is a 38 y.o. female who  has a past medical history of Ulcer of the stomach and intestine.. Patient presents today for Follow-up (wants to return to work )   Patient presents for follow-up on back pain.  Patient states that the pain is resolved and she would like a note to return to work without any restrictions.  Patient denies problems or concerns at the present time.  Patient currently pregnant and has an appointment with OB/GYN in February.  Review of Systems  Constitutional: Negative.   HENT: Negative.   Eyes: Negative.   Respiratory: Negative.   Cardiovascular: Negative.   Gastrointestinal: Negative.   Genitourinary: Negative.   Musculoskeletal: Negative.   Skin: Negative.   Neurological: Negative.   Psychiatric/Behavioral: Negative.      OBJECTIVE: BP (!) 93/48 (BP Location: Left Arm, Patient Position: Sitting, Cuff Size: Large)   Pulse 70   Temp 97.8 F (36.6 C) (Oral)   Resp 16   Ht 5\' 5"  (1.651 m)   Wt 273 lb (123.8 kg)   LMP 05/27/2018 (Exact Date) Comment: unsure of due date   SpO2 100%   BMI 45.43 kg/m   Wt Readings from Last 3 Encounters:  11/16/18 273 lb (123.8 kg)  11/08/18 274 lb (124.3 kg)  08/10/17 271 lb (122.9 kg)     Physical Exam Vitals signs and nursing note reviewed.  Constitutional:      General: She is not in acute distress.    Appearance: Normal appearance. She is obese.  Neurological:     Mental Status: She is alert and oriented to person, place, and time.  Psychiatric:        Mood and Affect: Mood normal.        Behavior: Behavior normal.        Thought Content: Thought content normal.        Judgment: Judgment normal.     ASSESSMENT/PLAN:   1. Acute left-sided thoracic back pain Resolved.     Return if symptoms worsen or fail to improve.    The patient was given clear  instructions to go to ER or return to medical center if symptoms do not improve, worsen or new problems develop. The patient verbalized understanding and agreed with plan of care.   Ms. Freda Jackson. Riley Lam, FNP-BC [facility] [address] [place], [place] [address] [phone]

## 2018-11-16 NOTE — Patient Instructions (Signed)
Pregnancy After Age 35  Women who become pregnant after the age of 35 have a higher risk for certain problems during pregnancy. This is because older women may already have health problems before becoming pregnant. Older women who are healthy before pregnancy may still develop problems during pregnancy. These problems may affect the mother, the unborn baby (fetus), or both.  What are the risks for me?  If you are over age 35 and you want to become pregnant or are pregnant, you may have a higher risk of:   Not being able to get pregnant (infertility).   Going into labor early (preterm labor).   Needing surgical delivery of your baby (cesarean delivery, or C-section).   Having high blood pressure (hypertension).   Having complications during pregnancy, such as high blood pressure and other symptoms (preeclampsia).   Having diabetes during pregnancy (gestational diabetes).   Being pregnant with more than one baby.   Loss of the unborn baby before 20 weeks (miscarriage) or after 20 weeks of pregnancy (stillbirth).  What are the risks for my baby?  Babies born to women over the age of 35 have a higher risk for:   Being born early (prematurity).   Low birth weight, which is less than 5 lb, 8 oz (2.5 kg).   Birth defects, such as Down syndrome and cleft palate.   Health complications, including problems with growth and development.  How is prenatal care different for women over age 35?  All women should see their health care provider before they try to become pregnant. This is especially important for women over the age of 35. Tell your health care provider about:   Any health problems you have.   Any medicines you take.   Any family history of health problems or chromosome-related defects.   Any problems you have had with past pregnancies or deliveries.  If you are over age 35 and you plan to become pregnant:   Start taking a daily multivitamin a month or more before you try to get pregnant. Your  multivitamin should contain 400 mcg (micrograms) of folic acid.  If you are over age 35 and pregnant, make sure you:   Keep taking your multivitamin unless your health care provider tells you not to take it.   Keep all prenatal visits as told by your health care provider. This is important.   Have ultrasounds regularly throughout your pregnancy to check for problems.   Talk with your health care provider about other prenatal screening tests that you may need.  What additional prenatal tests are needed?  Screening tests show whether your baby has a higher risk for birth defects than other babies. Screening tests include:   Ultrasound tests to look for markers that indicate a risk for birth defects.   Maternal blood screening. These are blood tests that measure certain substances in your blood to determine your baby's risk for defects.  Screening tests do not show whether your baby has or does not have defects. They only show your baby's risk for certain defects. If your screening tests show that risk factors are present, you may need tests to confirm the defect (diagnostic testing). These tests may include:   Chorionic villus sampling. For this procedure, a tissue sample is taken from the organ that forms in your uterus to nourish your baby (placenta). The sample is removed through your cervix or abdomen and tested.   Amniocentesis. For this procedure, a small amount of the fluid that surrounds   the baby in the uterus (amniotic fluid) is removed and tested.  What can I do to stay healthy during my pregnancy?  Staying healthy during pregnancy can help you and your baby to have a lower risk for problems during pregnancy, during delivery, or both. Talk with your health care provider for specific instructions about staying healthy during your pregnancy.  Nutrition     At each meal, eat a variety of foods from each of the five food groups. These groups include:  ? Proteins such as lean meats, poultry, fish that is  low in fat, beans, eggs, and nuts.  ? Vegetables such as leafy greens, raw and cooked vegetables, and vegetable juice.  ? Fruits that are fresh, frozen, or canned, or 100% fruit juice.  ? Dairy products such as low-fat yogurt, cheese, and milk.  ? Whole grains including rice, cereal, pasta, and bread.   Talk with your health care provider about how much food in each group is right for you.   Follow instructions from your health care provider about eating and drinking restrictions during pregnancy.  ? Do not eat raw eggs, raw meat, or raw fish or seafood.  ? Do not eat any fish that contains high amounts of mercury, such as swordfish or mackerel.   Drink 6-8 or more glasses of water a day. You should drink enough fluid to keep your urine pale yellow.  Managing weight gain   Ask your health care provider how much weight gain is healthy during pregnancy.   Stay at a healthy weight. If needed, work with your health care provider to lose weight safely.  Activity   Exercise regularly, as directed by your health care provider. Ask your health care provider what forms of exercise are safe for you.  General instructions   Do not use any products that contain nicotine or tobacco, such as cigarettes and e-cigarettes. If you need help quitting, ask your health care provider.   Do not drink alcohol, use drugs, or abuse prescription medicine.   Take over-the-counter and prescription medicines only as told by your health care provider.   Do not use hot tubs, steam rooms, or saunas.   Talk with your health care provider about your risk of exposure to harmful environmental conditions. This includes exposure to chemicals, radiation, cleaning products, and cat feces. Follow advice from your health care provider about how to limit your exposure.  Summary   Women who become pregnant after the age of 35 have a higher risk for complications during pregnancy.   Problems may affect the mother, the unborn baby (fetus), or  both.   All women should see their health care provider before they try to become pregnant. This is especially important for women over the age of 35.   Staying healthy during pregnancy can help both you and your baby to have a lower risk for some of the problems that can happen during pregnancy, during delivery, or both.  This information is not intended to replace advice given to you by your health care provider. Make sure you discuss any questions you have with your health care provider.  Document Released: 01/31/2017 Document Revised: 01/31/2017 Document Reviewed: 01/31/2017  [employer]vier Interactive Patient Education  2019 [employer]

## 2018-11-27 ENCOUNTER — Ambulatory Visit (INDEPENDENT_AMBULATORY_CARE_PROVIDER_SITE_OTHER): Payer: Medicaid Other | Admitting: Obstetrics & Gynecology

## 2018-11-27 ENCOUNTER — Other Ambulatory Visit (HOSPITAL_COMMUNITY)
Admission: RE | Admit: 2018-11-27 | Discharge: 2018-11-27 | Disposition: A | Discharge status: Home / Self Care | Payer: Medicaid Other | Source: Ambulatory Visit | Attending: Obstetrics & Gynecology | Admitting: Obstetrics & Gynecology

## 2018-11-27 ENCOUNTER — Encounter: Payer: Self-pay | Admitting: Obstetrics & Gynecology

## 2018-11-27 ENCOUNTER — Ambulatory Visit: Payer: Medicaid Other | Admitting: Clinical

## 2018-11-27 ENCOUNTER — Encounter: Payer: Self-pay | Admitting: Advanced Practice Midwife

## 2018-11-27 DIAGNOSIS — Z3490 Encounter for supervision of normal pregnancy, unspecified, unspecified trimester: Secondary | ICD-10-CM | POA: Insufficient documentation

## 2018-11-27 DIAGNOSIS — Z3492 Encounter for supervision of normal pregnancy, unspecified, second trimester: Secondary | ICD-10-CM | POA: Insufficient documentation

## 2018-11-27 NOTE — Progress Notes (Signed)
  Subjective:late to care    Priscilla Delgado is a [id number] [redacted]w[redacted]d being seen today for her first obstetrical visit.  Her obstetrical history is significant for advanced maternal age, obesity and previous cesarean section with 2 VBAC. Patient does intend to breast feed. Pregnancy history fully reviewed.  Patient reports right breast pain.  Vitals:   11/27/18 1008  BP: 103/66  Pulse: 79  Weight: 271 lb 3.2 oz (123 kg)    HISTORY: OB History  Gravida Para Term Preterm AB Living  4 3 3  0 0 3  SAB TAB Ectopic Multiple Live Births  0 0 0 0 3    # Outcome Date GA Lbr Len/2nd Weight Sex Delivery Anes PTL Lv  4 Current           3 Term 03/14/15 [redacted]w[redacted]d 04:09 / 00:15 6 lb 8.9 oz (2.975 kg) M VBAC None  LIV  2 Term 07/03/10 [redacted]w[redacted]d  5 lb 8.2 oz (2.5 kg) F Vag-Spont   LIV  1 Term 10/19/08 [redacted]w[redacted]d  6 lb 9.8 oz (3 kg) M CS-Unspec Spinal  LIV    Obstetric Comments  C/s for breech- leg was coming down   Past Medical History:  Diagnosis Date  . Ulcer of the stomach and intestine    Past Surgical History:  Procedure Laterality Date  . CESAREAN SECTION     History reviewed. No pertinent family history.   Exam    Uterus:     Pelvic Exam:    Perineum: No Hemorrhoids   Vulva: normal   Vagina:  normal mucosa   pH:     Cervix: no lesions   Adnexa: not evaluated   Bony Pelvis: average  System: Breast:  normal appearance, no masses or tenderness, indicates area of pain right breast medial quadrants   Skin: normal coloration and turgor, no rashes    Neurologic: oriented, normal mood   Extremities: normal strength, tone, and muscle mass   HEENT PERRLA   Mouth/Teeth mucous membranes moist, pharynx normal without lesions and dental hygiene good   Neck supple and no masses   Cardiovascular: regular rate and rhythm   Respiratory:  appears well, vitals normal, no respiratory distress, acyanotic, normal RR, neck free of mass or lymphadenopathy   Abdomen: soft, non-tender; bowel sounds normal; no  masses,  no organomegaly and vertical surgical scar well healed   Urinary: urethral meatus normal      Assessment:    Pregnancy: [id number] Patient Active Problem List   Diagnosis Date Noted  . Supervision of low-risk pregnancy 11/27/2018  . Acid reflux 10/09/2015  . Constipation 10/09/2015  . Postpartum breast / nipple pain 04/18/2015  . Wound of skin 02/13/2015  . Previous cesarean section complicating pregnancy 01/30/2015        Plan:     Initial labs drawn. Prenatal vitamins. Problem list reviewed and updated. Genetic Screening discussed Panorama requested.  Ultrasound discussed; fetal survey: declined.  Follow up in 2 weeks. 50% of 30 min visit spent on counseling and coordination of care.  [place] letter for her husband   Scheryl Darter 11/27/2018

## 2018-11-27 NOTE — Patient Instructions (Signed)

## 2018-11-27 NOTE — BH Specialist Note (Signed)
Integrated Behavioral Health Initial Visit  MRN: [id number] Name: Priscilla Delgado  Number of Integrated Behavioral Health Clinician visits:: 1/6 Session Start time: 10:44  Session End time: 11:51 Total time: 15 minutes  Type of Service: Integrated Behavioral Health- Individual/Family Interpretor:No. Interpretor Name and Language:n/a   Warm Hand Off Completed.       SUBJECTIVE: Priscilla Delgado is a 38 y.o. female accompanied by n/a Patient was referred by Scheryl Darter, MD for Initial OB introduction to integrated behavioral health services . Patient reports the following symptoms/concerns: Pt states no particular concerns today. Duration of problem: n/a; Severity of problem: n/a  OBJECTIVE: Mood: Normal and Affect: Appropriate Risk of harm to self or others: No plan to harm self or others  LIFE CONTEXT: Family and Social: - School/Work: - Self-Care: - Life Changes: Current pregnancy   GOALS ADDRESSED: Patient will: 1. Increase knowledge and/or ability of: healthy habits   INTERVENTIONS: Interventions utilized: Psychoeducation and/or Health Education  Standardized Assessments completed: Patient declined screening  ASSESSMENT: Patient currently experiencing Supervision of low-risk pregnancy in second trimester.   Patient may benefit from Initial OB introduction to integrated behavioral health services .  PLAN: 1. Follow up with behavioral health clinician on : As needed 2. Behavioral recommendations:  -Continue taking prenatal vitamin, as recommended by medical provider 3. Referral(s): Integrated Behavioral Health Services (In Clinic) 4. "From scale of 1-10, how likely are you to follow plan?": 10  Rae Lips, LCSW  Depression screen [facility] 2/9 11/16/2018 11/08/2018 08/10/2017 08/05/2017 05/11/2017  Decreased Interest 0 0 0 0 0  Down, Depressed, Hopeless 0 0 0 0 0  PHQ - 2 Score 0 0 0 0 0   No flowsheet data found.

## 2018-11-27 NOTE — Addendum Note (Signed)
Addended by: Gwendlyn Deutscher A on: 11/27/2018 02:08 PM   Modules accepted: Orders

## 2018-11-27 NOTE — Progress Notes (Signed)
Unable to send genetic screening on 2/3 due to unknown EDD. Anatomy ultrasound scheduled for 2/7 @ 1:30. Pt made aware.

## 2018-11-29 ENCOUNTER — Telehealth: Payer: Self-pay

## 2018-11-29 LAB — CYTOLOGY - PAP
ADEQUACY: ABSENT
Chlamydia: NEGATIVE
DIAGNOSIS: NEGATIVE
HPV (WINDOPATH): NOT DETECTED
Neisseria Gonorrhea: NEGATIVE

## 2018-11-29 LAB — URINE CULTURE, OB REFLEX: Organism ID, Bacteria: NO GROWTH

## 2018-11-29 LAB — CULTURE, OB URINE

## 2018-11-29 NOTE — Telephone Encounter (Signed)
-----   Message from Adam Phenix, MD sent at 11/29/2018  8:15 AM EST ----- Negative for infection

## 2018-11-29 NOTE — Telephone Encounter (Signed)
Called pt to advise of Negative Urine Culture Test Results, pt verbalized understanding.

## 2018-12-01 ENCOUNTER — Encounter (HOSPITAL_COMMUNITY): Payer: Self-pay

## 2018-12-01 ENCOUNTER — Telehealth: Payer: Self-pay | Admitting: Emergency Medicine

## 2018-12-01 ENCOUNTER — Ambulatory Visit (HOSPITAL_COMMUNITY)
Admission: RE | Admit: 2018-12-01 | Discharge: 2018-12-01 | Disposition: A | Discharge status: Home / Self Care | Payer: Medicaid Other | Source: Ambulatory Visit | Attending: Obstetrics & Gynecology | Admitting: Obstetrics & Gynecology

## 2018-12-01 DIAGNOSIS — Z3A26 26 weeks gestation of pregnancy: Secondary | ICD-10-CM

## 2018-12-01 DIAGNOSIS — Z3492 Encounter for supervision of normal pregnancy, unspecified, second trimester: Secondary | ICD-10-CM | POA: Diagnosis not present

## 2018-12-01 DIAGNOSIS — Z3687 Encounter for antenatal screening for uncertain dates: Secondary | ICD-10-CM

## 2018-12-01 DIAGNOSIS — O34219 Maternal care for unspecified type scar from previous cesarean delivery: Secondary | ICD-10-CM

## 2018-12-01 DIAGNOSIS — Z362 Encounter for other antenatal screening follow-up: Secondary | ICD-10-CM | POA: Diagnosis not present

## 2018-12-01 DIAGNOSIS — O99212 Obesity complicating pregnancy, second trimester: Secondary | ICD-10-CM | POA: Diagnosis not present

## 2018-12-01 DIAGNOSIS — O09523 Supervision of elderly multigravida, third trimester: Secondary | ICD-10-CM | POA: Diagnosis not present

## 2018-12-01 NOTE — Telephone Encounter (Signed)
Ultrasound for the breast center scheduled for 2/17 @ 12:40. Pt was called and notified of appointment date, time, and location. Pt repeated all the details of the appointment and verbalized understanding. No further questions.

## 2018-12-04 ENCOUNTER — Telehealth: Payer: Self-pay | Admitting: Family Medicine

## 2018-12-04 ENCOUNTER — Other Ambulatory Visit (HOSPITAL_COMMUNITY): Payer: Self-pay | Admitting: *Deleted

## 2018-12-04 DIAGNOSIS — O09523 Supervision of elderly multigravida, third trimester: Secondary | ICD-10-CM

## 2018-12-04 NOTE — Telephone Encounter (Signed)
Attempted to call pt to get her scheduled for rob appt. No answer and the VM was not setup so could not leave a message. Reminders mailed

## 2018-12-05 ENCOUNTER — Encounter: Payer: Self-pay | Admitting: Family Medicine

## 2018-12-07 LAB — INHERITEST(R) CF/SMA PANEL

## 2018-12-07 LAB — OBSTETRIC PANEL, INCLUDING HIV
Antibody Screen: NEGATIVE
Basophils Absolute: 0 10*3/uL (ref 0.0–0.2)
Basos: 0 %
EOS (ABSOLUTE): 0.1 10*3/uL (ref 0.0–0.4)
Eos: 1 %
HIV Screen 4th Generation wRfx: NONREACTIVE
Hematocrit: 34.4 % (ref 34.0–46.6)
Hemoglobin: 11.8 g/dL (ref 11.1–15.9)
Hepatitis B Surface Ag: NEGATIVE
Immature Grans (Abs): 0 10*3/uL (ref 0.0–0.1)
Immature Granulocytes: 1 %
LYMPHS ABS: 1.2 10*3/uL (ref 0.7–3.1)
Lymphs: 22 %
MCH: 31.1 pg (ref 26.6–33.0)
MCHC: 34.3 g/dL (ref 31.5–35.7)
MCV: 91 fL (ref 79–97)
Monocytes Absolute: 0.3 10*3/uL (ref 0.1–0.9)
Monocytes: 6 %
Neutrophils Absolute: 3.7 10*3/uL (ref 1.4–7.0)
Neutrophils: 70 %
Platelets: 154 10*3/uL (ref 150–450)
RBC: 3.8 x10E6/uL (ref 3.77–5.28)
RDW: 12.8 % (ref 11.7–15.4)
RPR Ser Ql: NONREACTIVE
Rh Factor: POSITIVE
Rubella Antibodies, IGG: 19.6 index (ref >0.99)
WBC: 5.3 10*3/uL (ref 3.4–10.8)

## 2018-12-07 LAB — HEMOGLOBINOPATHY EVALUATION
Ferritin: 39 ng/mL (ref 15–150)
Hgb A2 Quant: 2.4 % (ref 1.8–3.2)
Hgb A: 97.6 % (ref 96.4–98.8)
Hgb C: 0 %
Hgb F Quant: 0 % (ref 0.0–2.0)
Hgb S: 0 %
Hgb Solubility: NEGATIVE
Hgb Variant: 0 %

## 2018-12-11 ENCOUNTER — Other Ambulatory Visit: Payer: Self-pay | Admitting: Obstetrics & Gynecology

## 2018-12-11 ENCOUNTER — Ambulatory Visit
Admission: RE | Admit: 2018-12-11 | Discharge: 2018-12-11 | Disposition: A | Discharge status: Home / Self Care | Payer: Medicaid Other | Source: Ambulatory Visit | Attending: Obstetrics & Gynecology | Admitting: Obstetrics & Gynecology

## 2018-12-11 DIAGNOSIS — Z3492 Encounter for supervision of normal pregnancy, unspecified, second trimester: Secondary | ICD-10-CM

## 2018-12-11 DIAGNOSIS — N63 Unspecified lump in unspecified breast: Secondary | ICD-10-CM

## 2018-12-11 DIAGNOSIS — N6312 Unspecified lump in the right breast, upper inner quadrant: Secondary | ICD-10-CM | POA: Diagnosis not present

## 2018-12-13 ENCOUNTER — Other Ambulatory Visit: Payer: Medicaid Other

## 2018-12-13 ENCOUNTER — Ambulatory Visit (INDEPENDENT_AMBULATORY_CARE_PROVIDER_SITE_OTHER): Payer: Medicaid Other | Admitting: Obstetrics and Gynecology

## 2018-12-13 ENCOUNTER — Encounter: Payer: Self-pay | Admitting: Medical

## 2018-12-13 ENCOUNTER — Encounter: Payer: Self-pay | Admitting: Obstetrics and Gynecology

## 2018-12-13 VITALS — BP 110/70 | HR 77 | Wt 275.0 lb

## 2018-12-13 DIAGNOSIS — Z3492 Encounter for supervision of normal pregnancy, unspecified, second trimester: Secondary | ICD-10-CM | POA: Diagnosis not present

## 2018-12-13 DIAGNOSIS — Z3493 Encounter for supervision of normal pregnancy, unspecified, third trimester: Secondary | ICD-10-CM

## 2018-12-13 DIAGNOSIS — Z3A29 29 weeks gestation of pregnancy: Secondary | ICD-10-CM

## 2018-12-13 DIAGNOSIS — O34219 Maternal care for unspecified type scar from previous cesarean delivery: Secondary | ICD-10-CM

## 2018-12-13 MED ORDER — DOCUSATE SODIUM 100 MG PO CAPS: 100.0000 mg | ORAL_CAPSULE | Freq: Two times a day (BID) | ORAL | 2 refills | Status: DC | PRN

## 2018-12-13 NOTE — Progress Notes (Signed)
   PRENATAL VISIT NOTE  Subjective:  Priscilla Delgado is a 38 y.o. G4P3003 at [redacted]w[redacted]d being seen today for ongoing prenatal care.  She is currently monitored for the following issues for this high-risk pregnancy and has Previous cesarean section complicating pregnancy; Wound of skin; Postpartum breast / nipple pain; Acid reflux; Constipation; and Supervision of low-risk pregnancy on their problem list.  Patient reports constipation.  Contractions: Not present. Vag. Bleeding: None.  Movement: Present. Denies leaking of fluid.   The following portions of the patient's history were reviewed and updated as appropriate: allergies, current medications, past family history, past medical history, past social history, past surgical history and problem list. Problem list updated.  Objective:   Vitals:   12/13/18 1125  BP: 110/70  Pulse: 77  Weight: 275 lb (124.7 kg)    Fetal Status: Fetal Heart Rate (bpm): 146 Fundal Height: 30 cm Movement: Present     General:  Alert, oriented and cooperative. Patient is in no acute distress.  Skin: Skin is warm and dry. No rash noted.   Cardiovascular: Normal heart rate noted  Respiratory: Normal respiratory effort, no problems with respiration noted  Abdomen: Soft, gravid, appropriate for gestational age.  Pain/Pressure: Absent     Pelvic: Cervical exam deferred        Extremities: Normal range of motion.  Edema: None  Mental Status: Normal mood and affect. Normal behavior. Normal judgment and thought content.   Assessment and Plan:  Pregnancy: G4P3003 at [redacted]w[redacted]d  1. Encounter for supervision of low-risk pregnancy in third trimester Patient is doing well without complaints Advised to increase water intake and to take fiber supplements Rx colace provided Third trimester labs today Patient undecided on contraception  2. Previous cesarean section complicating pregnancy Desires TOLAC  Preterm labor symptoms and general obstetric precautions including but  not limited to vaginal bleeding, contractions, leaking of fluid and fetal movement were reviewed in detail with the patient. Please refer to After Visit Summary for other counseling recommendations.  Return in about 2 weeks (around 12/27/2018) for ROB.  Future Appointments  Date Time Provider Department Center  12/29/2018 11:15 AM [facility] [place] 4 WH-M[facility] MFC-US  04/12/2019 12:30 PM [facility] [place] 1 [facility]    Catalina Antigua, MD

## 2018-12-14 LAB — CBC
Hematocrit: 34.3 % (ref 34.0–46.6)
Hemoglobin: 11.4 g/dL (ref 11.1–15.9)
MCH: 30.6 pg (ref 26.6–33.0)
MCHC: 33.2 g/dL (ref 31.5–35.7)
MCV: 92 fL (ref 79–97)
Platelets: 156 10*3/uL (ref 150–450)
RBC: 3.73 x10E6/uL — AB (ref 3.77–5.28)
RDW: 12.4 % (ref 11.7–15.4)
WBC: 5.8 10*3/uL (ref 3.4–10.8)

## 2018-12-14 LAB — GLUCOSE TOLERANCE, 2 HOURS W/ 1HR
GLUCOSE, 1 HOUR: 88 mg/dL (ref 65–179)
GLUCOSE, 2 HOUR: 92 mg/dL (ref 65–152)
Glucose, Fasting: 77 mg/dL (ref 65–91)

## 2018-12-14 LAB — HIV ANTIBODY (ROUTINE TESTING W REFLEX): HIV Screen 4th Generation wRfx: NONREACTIVE

## 2018-12-14 LAB — RPR: RPR Ser Ql: NONREACTIVE

## 2018-12-28 ENCOUNTER — Encounter: Payer: Medicaid Other | Admitting: Obstetrics and Gynecology

## 2018-12-29 ENCOUNTER — Other Ambulatory Visit (HOSPITAL_COMMUNITY): Payer: Self-pay | Admitting: Obstetrics and Gynecology

## 2018-12-29 ENCOUNTER — Other Ambulatory Visit (HOSPITAL_COMMUNITY): Payer: Self-pay | Admitting: *Deleted

## 2018-12-29 ENCOUNTER — Ambulatory Visit (HOSPITAL_COMMUNITY)
Admission: RE | Admit: 2018-12-29 | Discharge: 2018-12-29 | Disposition: A | Discharge status: Home / Self Care | Payer: Medicaid Other | Source: Ambulatory Visit | Attending: Obstetrics & Gynecology | Admitting: Obstetrics & Gynecology

## 2018-12-29 ENCOUNTER — Encounter (HOSPITAL_COMMUNITY): Payer: Self-pay

## 2018-12-29 ENCOUNTER — Ambulatory Visit (HOSPITAL_COMMUNITY): Payer: Medicaid Other | Admitting: *Deleted

## 2018-12-29 VITALS — BP 97/48 | HR 68 | Wt 271.2 lb

## 2018-12-29 DIAGNOSIS — O34219 Maternal care for unspecified type scar from previous cesarean delivery: Secondary | ICD-10-CM

## 2018-12-29 DIAGNOSIS — Z362 Encounter for other antenatal screening follow-up: Secondary | ICD-10-CM | POA: Diagnosis not present

## 2018-12-29 DIAGNOSIS — O09523 Supervision of elderly multigravida, third trimester: Secondary | ICD-10-CM | POA: Diagnosis not present

## 2018-12-29 DIAGNOSIS — Z3A30 30 weeks gestation of pregnancy: Secondary | ICD-10-CM | POA: Diagnosis not present

## 2018-12-29 DIAGNOSIS — O09529 Supervision of elderly multigravida, unspecified trimester: Secondary | ICD-10-CM

## 2018-12-29 DIAGNOSIS — Z3A31 31 weeks gestation of pregnancy: Secondary | ICD-10-CM

## 2018-12-29 DIAGNOSIS — O368130 Decreased fetal movements, third trimester, not applicable or unspecified: Secondary | ICD-10-CM | POA: Diagnosis not present

## 2018-12-29 DIAGNOSIS — O99213 Obesity complicating pregnancy, third trimester: Secondary | ICD-10-CM | POA: Diagnosis not present

## 2018-12-29 NOTE — Progress Notes (Signed)
Pt reports no movement since yesterday, unsure of exact time she last felt movement.  Pt in ultrasound room 3.

## 2019-01-10 ENCOUNTER — Telehealth: Payer: Self-pay

## 2019-01-10 NOTE — Telephone Encounter (Signed)
Called patient for appointment information for 01/10/2019, no answer and no VM set up

## 2019-01-11 ENCOUNTER — Ambulatory Visit (INDEPENDENT_AMBULATORY_CARE_PROVIDER_SITE_OTHER): Payer: Medicaid Other | Admitting: Medical

## 2019-01-11 ENCOUNTER — Encounter: Payer: Self-pay | Admitting: Medical

## 2019-01-11 ENCOUNTER — Other Ambulatory Visit: Payer: Self-pay

## 2019-01-11 VITALS — BP 102/56 | HR 75 | Temp 98.4°F | Wt 269.1 lb

## 2019-01-11 DIAGNOSIS — O34219 Maternal care for unspecified type scar from previous cesarean delivery: Secondary | ICD-10-CM

## 2019-01-11 DIAGNOSIS — Z3493 Encounter for supervision of normal pregnancy, unspecified, third trimester: Secondary | ICD-10-CM

## 2019-01-11 DIAGNOSIS — Z3A33 33 weeks gestation of pregnancy: Secondary | ICD-10-CM

## 2019-01-11 NOTE — Patient Instructions (Signed)
Preparing for Vaginal Birth After Cesarean Delivery Vaginal birth after cesarean delivery (VBAC) is giving birth vaginally after previously delivering a baby through a cesarean section (C-section). You and your health are provider will discuss your options and whether you may be a good candidate for VBAC. What are my options? After a cesarean delivery, your options for future deliveries may include:  Scheduled repeat cesarean delivery. This is done in a hospital with an operating room.  Trial of labor after cesarean (TOLAC). A successful TOLAC results in a vaginal delivery. If it is not successful, you will need to have a cesarean delivery. TOLAC should be attempted in facilities where an emergency cesarean delivery can be performed. It should not be done as a home birth. Talk with your health care provider about the risks and benefits of each option early in your pregnancy. The best option for you will depend on your preferences and your overall health as well as your baby's. What should I know about my past cesarean delivery? It is important to know what type of incision was made in your uterus in a past cesarean delivery. The type of incision can affect the success of your TOLAC. Types of incisions include:  Low transverse. This is a side-to-side cut low on your uterus. The scar on your skin looks like a horizontal line just above your pubic area. This type of cut is the most common and makes you a good candidate for TOLAC.  Low vertical. This is an up-and-down cut low on your uterus. The scar on your skin looks like a vertical line between your pubic area and belly button. This type of cut puts you at higher risk for problems during TOLAC.  High vertical or classical. This is an up-and-down cut high on your uterus. The scar on your skin looks like a vertical line that runs over the top of your belly button. This type of cut has the highest risk for problems and usually means that TOLAC is not an  option. When is VBAC not an option? As you progress through your pregnancy, circumstances may change and you may need to reconsider your options. Your situation may also change even as you begin TOLAC. Your health care provider may not want you to attempt a VBAC if you:  Need to have labor started (induced) because your cervix is not ready for labor.  Have never had a vaginal delivery.  Have had more than two cesarean deliveries.  Are overdue.  Are pregnant with a very large baby.  Have a condition that causes high blood pressure (preeclampsia). Questions to ask your health care provider  Am I a good candidate for TOLAC?  What are my chances of a successful vaginal delivery?  Is my preferred birth location equipped for a TOLAC?  What are my pain management options during a TOLAC? Where to find more information  American Congress of Obstetricians and Gynecologists: www.acog.org  American College of Nurse-Midwives: www.midwife.org Summary  Vaginal birth after cesarean delivery (VBAC) is giving birth vaginally after previously delivering a baby through a cesarean section (C-section).  VBAC may be a safe and appropriate option for you depending on your medical history and other risk factors. Talk with your health care provider about the options available to you, and the risks and benefits of each early in your pregnancy.  TOLAC should be attempted in facilities where emergency cesarean section procedures can be performed. This information is not intended to replace advice given to you by   your health care provider. Make sure you discuss any questions you have with your health care provider. Document Released: 06/29/2011 Document Revised: 01/20/2017 Document Reviewed: 01/20/2017 Elsevier Interactive Patient Education  2019 [employer] of the uterus can occur throughout pregnancy, but they are not always a sign that you are in labor. You may  have practice contractions called Braxton Hicks contractions. These false labor contractions are sometimes confused with true labor. What are Deberah Pelton contractions? Braxton Hicks contractions are tightening movements that occur in the muscles of the uterus before labor. Unlike true labor contractions, these contractions do not result in opening (dilation) and thinning of the cervix. Toward the end of pregnancy (32-34 weeks), Braxton Hicks contractions can happen more often and may become stronger. These contractions are sometimes difficult to tell apart from true labor because they can be very uncomfortable. You should not feel embarrassed if you go to the hospital with false labor. Sometimes, the only way to tell if you are in true labor is for your health care provider to look for changes in the cervix. The health care provider will do a physical exam and may monitor your contractions. If you are not in true labor, the exam should show that your cervix is not dilating and your water has not broken. If there are no other health problems associated with your pregnancy, it is completely safe for you to be sent home with false labor. You may continue to have Braxton Hicks contractions until you go into true labor. How to tell the difference between true labor and false labor True labor  Contractions last 30-70 seconds.  Contractions become very regular.  Discomfort is usually felt in the top of the uterus, and it spreads to the lower abdomen and low back.  Contractions do not go away with walking.  Contractions usually become more intense and increase in frequency.  The cervix dilates and gets thinner. False labor  Contractions are usually shorter and not as strong as true labor contractions.  Contractions are usually irregular.  Contractions are often felt in the front of the lower abdomen and in the groin.  Contractions may go away when you walk around or change positions while lying  down.  Contractions get weaker and are shorter-lasting as time goes on.  The cervix usually does not dilate or become thin. Follow these instructions at home:   Take over-the-counter and prescription medicines only as told by your health care provider.  Keep up with your usual exercises and follow other instructions from your health care provider.  Eat and drink lightly if you think you are going into labor.  If Braxton Hicks contractions are making you uncomfortable: ? Change your position from lying down or resting to walking, or change from walking to resting. ? Sit and rest in a tub of warm water. ? Drink enough fluid to keep your urine pale yellow. Dehydration may cause these contractions. ? Do slow and deep breathing several times an hour.  Keep all follow-up prenatal visits as told by your health care provider. This is important. Contact a health care provider if:  You have a fever.  You have continuous pain in your abdomen. Get help right away if:  Your contractions become stronger, more regular, and closer together.  You have fluid leaking or gushing from your vagina.  You pass blood-tinged mucus (bloody show).  You have bleeding from your vagina.  You have low back pain that you never had  before.  You feel your baby's head pushing down and causing pelvic pressure.  Your baby is not moving inside you as much as it used to. Summary  Contractions that occur before labor are called Braxton Hicks contractions, false labor, or practice contractions.  Braxton Hicks contractions are usually shorter, weaker, farther apart, and less regular than true labor contractions. True labor contractions usually become progressively stronger and regular, and they become more frequent.  Manage discomfort from [facility] contractions by changing position, resting in a warm bath, drinking plenty of water, or practicing deep breathing. This information is not intended to replace  advice given to you by your health care provider. Make sure you discuss any questions you have with your health care provider. Document Released: 02/24/2017 Document Revised: 07/26/2017 Document Reviewed: 02/24/2017 Elsevier Interactive Patient Education  2019 Elsevier Inc. Fetal Movement Counts Patient Name: ________________________________________________ Patient Due Date: ____________________ What is a fetal movement count?  A fetal movement count is the number of times that you feel your baby move during a certain amount of time. This may also be called a fetal kick count. A fetal movement count is recommended for every pregnant woman. You may be asked to start counting fetal movements as early as week 28 of your pregnancy. Pay attention to when your baby is most active. You may notice your baby's sleep and wake cycles. You may also notice things that make your baby move more. You should do a fetal movement count:  When your baby is normally most active.  At the same time each day. A good time to count movements is while you are resting, after having something to eat and drink. How do I count fetal movements? 1. Find a quiet, comfortable area. Sit, or lie down on your side. 2. Write down the date, the start time and stop time, and the number of movements that you felt between those two times. Take this information with you to your health care visits. 3. For 2 hours, count kicks, flutters, swishes, rolls, and jabs. You should feel at least 10 movements during 2 hours. 4. You may stop counting after you have felt 10 movements. 5. If you do not feel 10 movements in 2 hours, have something to eat and drink. Then, keep resting and counting for 1 hour. If you feel at least 4 movements during that hour, you may stop counting. Contact a health care provider if:  You feel fewer than 4 movements in 2 hours.  Your baby is not moving like he or she usually does. Date: ____________ Start time:  ____________ Stop time: ____________ Movements: ____________ Date: ____________ Start time: ____________ Stop time: ____________ Movements: ____________ Date: ____________ Start time: ____________ Stop time: ____________ Movements: ____________ Date: ____________ Start time: ____________ Stop time: ____________ Movements: ____________ Date: ____________ Start time: ____________ Stop time: ____________ Movements: ____________ Date: ____________ Start time: ____________ Stop time: ____________ Movements: ____________ Date: ____________ Start time: ____________ Stop time: ____________ Movements: ____________ Date: ____________ Start time: ____________ Stop time: ____________ Movements: ____________ Date: ____________ Start time: ____________ Stop time: ____________ Movements: ____________ This information is not intended to replace advice given to you by your health care provider. Make sure you discuss any questions you have with your health care provider. Document Released: 11/10/2006 Document Revised: 06/09/2016 Document Reviewed: 11/20/2015 Elsevier Interactive Patient Education  2019 [employer].

## 2019-01-11 NOTE — Progress Notes (Signed)
   PRENATAL VISIT NOTE  Subjective:  Priscilla Delgado is a 38 y.o. G4P3003 at [redacted]w[redacted]d being seen today for ongoing prenatal care.  She is currently monitored for the following issues for this high-risk pregnancy and has Previous cesarean section complicating pregnancy and Supervision of low-risk pregnancy on their problem list.  Patient reports no complaints.  Contractions: Not present. Vag. Bleeding: None.  Movement: Present. Denies leaking of fluid.   The following portions of the patient's history were reviewed and updated as appropriate: allergies, current medications, past family history, past medical history, past social history, past surgical history and problem list.   Objective:   Vitals:   01/11/19 1008  BP: (!) 102/56  Pulse: 75  Temp: 98.4 F (36.9 C)  Weight: 269 lb 1.6 oz (122.1 kg)    Fetal Status: Fetal Heart Rate (bpm): 143 Fundal Height: 34 cm Movement: Present     General:  Alert, oriented and cooperative. Patient is in no acute distress.  Skin: Skin is warm and dry. No rash noted.   Cardiovascular: Normal heart rate noted  Respiratory: Normal respiratory effort, no problems with respiration noted  Abdomen: Soft, gravid, appropriate for gestational age.  Pain/Pressure: Absent     Pelvic: Cervical exam deferred        Extremities: Normal range of motion.  Edema: None  Mental Status: Normal mood and affect. Normal behavior. Normal judgment and thought content.   Assessment and Plan:  Pregnancy: G4P3003 at 103w5d 1. Encounter for supervision of low-risk pregnancy in third trimester - Doing well, no complaints - Encouraged patient to call ahead if experiencing respiratory symptoms with fever so that she can be triaged and tested/directed appropriately    2. Previous cesarean section complicating pregnancy - TOLAC consent signed  Preterm labor symptoms and general obstetric precautions including but not limited to vaginal bleeding, contractions, leaking of fluid and  fetal movement were reviewed in detail with the patient. Please refer to After Visit Summary for other counseling recommendations.   Return in about 3 weeks (around 02/01/2019) for LOB.  Future Appointments  Date Time Provider Department Center  01/26/2019 11:30 AM WH-[facility] [place] 5 WH-MFCUS MFC-US  01/26/2019 11:55 AM WH-MFC NURSE WH-MFC MFC-US  04/12/2019 12:30 PM [facility] [place] 1 [facility]    Vonzella Nipple, PA-C

## 2019-01-15 ENCOUNTER — Encounter: Payer: Self-pay | Admitting: *Deleted

## 2019-01-26 ENCOUNTER — Other Ambulatory Visit: Payer: Self-pay

## 2019-01-26 ENCOUNTER — Ambulatory Visit (HOSPITAL_COMMUNITY): Payer: Medicaid Other | Admitting: *Deleted

## 2019-01-26 ENCOUNTER — Encounter (HOSPITAL_COMMUNITY): Payer: Self-pay

## 2019-01-26 ENCOUNTER — Ambulatory Visit (HOSPITAL_COMMUNITY)
Admission: RE | Admit: 2019-01-26 | Discharge: 2019-01-26 | Disposition: A | Discharge status: Home / Self Care | Payer: Medicaid Other | Source: Ambulatory Visit | Attending: Obstetrics and Gynecology | Admitting: Obstetrics and Gynecology

## 2019-01-26 ENCOUNTER — Other Ambulatory Visit (HOSPITAL_COMMUNITY): Payer: Self-pay | Admitting: Maternal & Fetal Medicine

## 2019-01-26 VITALS — BP 95/55 | HR 76 | Temp 98.6°F

## 2019-01-26 DIAGNOSIS — Z362 Encounter for other antenatal screening follow-up: Secondary | ICD-10-CM | POA: Diagnosis not present

## 2019-01-26 DIAGNOSIS — O34219 Maternal care for unspecified type scar from previous cesarean delivery: Secondary | ICD-10-CM | POA: Diagnosis not present

## 2019-01-26 DIAGNOSIS — O09529 Supervision of elderly multigravida, unspecified trimester: Secondary | ICD-10-CM

## 2019-01-26 DIAGNOSIS — O99213 Obesity complicating pregnancy, third trimester: Secondary | ICD-10-CM | POA: Diagnosis not present

## 2019-01-26 DIAGNOSIS — Z3A34 34 weeks gestation of pregnancy: Secondary | ICD-10-CM | POA: Diagnosis not present

## 2019-01-26 DIAGNOSIS — O09523 Supervision of elderly multigravida, third trimester: Secondary | ICD-10-CM

## 2019-01-30 ENCOUNTER — Encounter: Payer: Medicaid Other | Admitting: Medical

## 2019-02-01 ENCOUNTER — Encounter: Payer: Medicaid Other | Admitting: Obstetrics and Gynecology

## 2019-02-05 ENCOUNTER — Ambulatory Visit (INDEPENDENT_AMBULATORY_CARE_PROVIDER_SITE_OTHER): Payer: Medicaid Other | Admitting: Advanced Practice Midwife

## 2019-02-05 ENCOUNTER — Encounter: Payer: Self-pay | Admitting: Advanced Practice Midwife

## 2019-02-05 ENCOUNTER — Other Ambulatory Visit: Payer: Self-pay

## 2019-02-05 ENCOUNTER — Other Ambulatory Visit (HOSPITAL_COMMUNITY)
Admission: RE | Admit: 2019-02-05 | Discharge: 2019-02-05 | Disposition: A | Discharge status: Home / Self Care | Payer: Medicaid Other | Source: Ambulatory Visit | Attending: Medical | Admitting: Medical

## 2019-02-05 VITALS — BP 98/63 | HR 77 | Temp 98.2°F | Wt 277.0 lb

## 2019-02-05 DIAGNOSIS — Z3493 Encounter for supervision of normal pregnancy, unspecified, third trimester: Secondary | ICD-10-CM | POA: Diagnosis not present

## 2019-02-05 DIAGNOSIS — Z3A37 37 weeks gestation of pregnancy: Secondary | ICD-10-CM

## 2019-02-05 MED ORDER — BLOOD PRESSURE CUFF MISC: 1.0000 | 0 refills | Status: DC

## 2019-02-05 MED ORDER — PRENATAL COMPLETE 14-0.4 MG PO TABS: 1.0000 | ORAL_TABLET | Freq: Every day | ORAL | 2 refills | Status: DC

## 2019-02-05 NOTE — Progress Notes (Signed)
   PRENATAL VISIT NOTE  Subjective:  Priscilla Delgado is a 38 y.o. G4P3003 at [redacted]w[redacted]d being seen today for ongoing prenatal care.  She is currently monitored for the following issues for this low-risk pregnancy and has Previous cesarean section complicating pregnancy and Supervision of low-risk pregnancy on their problem list.  Patient reports no complaints.  Contractions: Not present. Vag. Bleeding: None.  Movement: Present. Denies leaking of fluid.   The following portions of the patient's history were reviewed and updated as appropriate: allergies, current medications, past family history, past medical history, past social history, past surgical history and problem list.   Objective:   Vitals:   02/05/19 1423  BP: 98/63  Pulse: 77  Temp: 98.2 F (36.8 C)  Weight: 277 lb (125.6 kg)    Fetal Status: Fetal Heart Rate (bpm): 148 Fundal Height: 37 cm Movement: Present     General:  Alert, oriented and cooperative. Patient is in no acute distress.  Skin: Skin is warm and dry. No rash noted.   Cardiovascular: Normal heart rate noted  Respiratory: Normal respiratory effort, no problems with respiration noted  Abdomen: Soft, gravid, appropriate for gestational age.  Pain/Pressure: Absent     Pelvic: Cervical exam performed Dilation: Fingertip Effacement (%): Thick Station: Ballotable  Extremities: Normal range of motion.  Edema: Trace  Mental Status: Normal mood and affect. Normal behavior. Normal judgment and thought content.   Assessment and Plan:  Pregnancy: G4P3003 at [redacted]w[redacted]d 1. Encounter for supervision of low-risk pregnancy in third trimester - routine care - rx sent for blood pressure cuff so that we can do video visits  - Culture, beta strep (group b only) - Cervicovaginal ancillary only( [place])  Term labor symptoms and general obstetric precautions including but not limited to vaginal bleeding, contractions, leaking of fluid and fetal movement were reviewed in detail with  the patient. Please refer to After Visit Summary for other counseling recommendations.   Return in about 1 week (around 02/12/2019) for video .  Future Appointments  Date Time Provider Department Center  04/12/2019 12:30 PM [facility] [place] 1 GI-[facility]    Thressa Sheller DNP, CNM  02/05/19  2:42 PM

## 2019-02-06 LAB — CERVICOVAGINAL ANCILLARY ONLY
Chlamydia: NEGATIVE
Neisseria Gonorrhea: NEGATIVE

## 2019-02-07 ENCOUNTER — Telehealth: Payer: Self-pay | Admitting: Obstetrics & Gynecology

## 2019-02-07 NOTE — Telephone Encounter (Signed)
Called the patient to inform of upcoming virtual appointment. Received a message "The patient has a voicemail box that is not setup yet."

## 2019-02-09 ENCOUNTER — Other Ambulatory Visit: Payer: Self-pay

## 2019-02-09 ENCOUNTER — Encounter: Payer: Medicaid Other | Admitting: Obstetrics & Gynecology

## 2019-02-09 LAB — CULTURE, BETA STREP (GROUP B ONLY): Strep Gp B Culture: NEGATIVE

## 2019-02-09 NOTE — Progress Notes (Signed)
0909//Attempted to contact pt but the number provided rang several times and then the message "I'm sorry but the number you called does not have a voicemail box that is set up."  0931//Attempted to contact pt but the number provided rang several times and then the messgae "I"m sorry but the number you called does not have a voicemail box that is set up" was received.

## 2019-02-12 ENCOUNTER — Encounter: Payer: Medicaid Other | Admitting: Advanced Practice Midwife

## 2019-02-16 NOTE — Progress Notes (Signed)
This encounter was created in error - please disregard.

## 2019-02-19 ENCOUNTER — Other Ambulatory Visit: Payer: Self-pay

## 2019-02-19 ENCOUNTER — Inpatient Hospital Stay (HOSPITAL_COMMUNITY)
Admission: AD | Admit: 2019-02-19 | Discharge: 2019-02-21 | DRG: 807 | Disposition: A | Discharge status: Home / Self Care | Payer: Medicaid Other | Attending: Obstetrics and Gynecology | Admitting: Obstetrics and Gynecology

## 2019-02-19 ENCOUNTER — Encounter (HOSPITAL_COMMUNITY): Payer: Self-pay

## 2019-02-19 DIAGNOSIS — Z8711 Personal history of peptic ulcer disease: Secondary | ICD-10-CM

## 2019-02-19 DIAGNOSIS — E669 Obesity, unspecified: Secondary | ICD-10-CM | POA: Diagnosis present

## 2019-02-19 DIAGNOSIS — O99214 Obesity complicating childbirth: Secondary | ICD-10-CM | POA: Diagnosis present

## 2019-02-19 DIAGNOSIS — O99210 Obesity complicating pregnancy, unspecified trimester: Secondary | ICD-10-CM | POA: Diagnosis present

## 2019-02-19 DIAGNOSIS — Z3A39 39 weeks gestation of pregnancy: Secondary | ICD-10-CM

## 2019-02-19 DIAGNOSIS — O34219 Maternal care for unspecified type scar from previous cesarean delivery: Secondary | ICD-10-CM | POA: Diagnosis present

## 2019-02-19 DIAGNOSIS — O09529 Supervision of elderly multigravida, unspecified trimester: Secondary | ICD-10-CM

## 2019-02-19 DIAGNOSIS — O26893 Other specified pregnancy related conditions, third trimester: Secondary | ICD-10-CM | POA: Diagnosis present

## 2019-02-19 DIAGNOSIS — Z98891 History of uterine scar from previous surgery: Secondary | ICD-10-CM | POA: Diagnosis present

## 2019-02-19 DIAGNOSIS — Z3493 Encounter for supervision of normal pregnancy, unspecified, third trimester: Secondary | ICD-10-CM

## 2019-02-19 LAB — CBC
HCT: 35.8 % — ABNORMAL LOW (ref 36.0–46.0)
Hemoglobin: 11.9 g/dL — ABNORMAL LOW (ref 12.0–15.0)
MCH: 30.4 pg (ref 26.0–34.0)
MCHC: 33.2 g/dL (ref 30.0–36.0)
MCV: 91.6 fL (ref 80.0–100.0)
Platelets: 143 10*3/uL — ABNORMAL LOW (ref 150–400)
RBC: 3.91 MIL/uL (ref 3.87–5.11)
RDW: 13.3 % (ref 11.5–15.5)
WBC: 8.5 10*3/uL (ref 4.0–10.5)
nRBC: 0 % (ref 0.0–0.2)

## 2019-02-19 LAB — TYPE AND SCREEN
ABO/RH(D): O POS
Antibody Screen: NEGATIVE

## 2019-02-19 LAB — AMNISURE RUPTURE OF MEMBRANE (ROM) NOT AT ARMC: Amnisure ROM: NEGATIVE

## 2019-02-19 MED ORDER — ONDANSETRON HCL 4 MG/2ML IJ SOLN: 4.0000 mg | INTRAMUSCULAR | Status: DC | PRN

## 2019-02-19 MED ORDER — SIMETHICONE 80 MG PO CHEW: 80.0000 mg | CHEWABLE_TABLET | ORAL | Status: DC | PRN

## 2019-02-19 MED ORDER — ONDANSETRON HCL 4 MG/2ML IJ SOLN: 4.0000 mg | Freq: Four times a day (QID) | INTRAMUSCULAR | Status: DC | PRN

## 2019-02-19 MED ORDER — FENTANYL CITRATE (PF) 100 MCG/2ML IJ SOLN
100.0000 ug | INTRAMUSCULAR | Status: DC | PRN
  Filled 2019-02-19: qty 2

## 2019-02-19 MED ORDER — BENZOCAINE-MENTHOL 20-0.5 % EX AERO: 1.0000 "application " | INHALATION_SPRAY | CUTANEOUS | Status: DC | PRN

## 2019-02-19 MED ORDER — FERROUS SULFATE 325 (65 FE) MG PO TABS
325.0000 mg | ORAL_TABLET | Freq: Two times a day (BID) | ORAL | Status: DC
  Administered 2019-02-20 – 2019-02-21 (×3): 325 mg via ORAL
  Filled 2019-02-19 (×3): qty 1

## 2019-02-19 MED ORDER — FLEET ENEMA 7-19 GM/118ML RE ENEM: 1.0000 | ENEMA | RECTAL | Status: DC | PRN

## 2019-02-19 MED ORDER — OXYCODONE-ACETAMINOPHEN 5-325 MG PO TABS: 2.0000 | ORAL_TABLET | ORAL | Status: DC | PRN

## 2019-02-19 MED ORDER — ACETAMINOPHEN 325 MG PO TABS: 650.0000 mg | ORAL_TABLET | ORAL | Status: DC | PRN

## 2019-02-19 MED ORDER — DIPHENHYDRAMINE HCL 25 MG PO CAPS: 25.0000 mg | ORAL_CAPSULE | Freq: Four times a day (QID) | ORAL | Status: DC | PRN

## 2019-02-19 MED ORDER — IBUPROFEN 600 MG PO TABS
600.0000 mg | ORAL_TABLET | Freq: Four times a day (QID) | ORAL | Status: DC
  Administered 2019-02-19 – 2019-02-21 (×6): 600 mg via ORAL
  Filled 2019-02-19 (×7): qty 1

## 2019-02-19 MED ORDER — DIBUCAINE (PERIANAL) 1 % EX OINT: 1.0000 "application " | TOPICAL_OINTMENT | CUTANEOUS | Status: DC | PRN

## 2019-02-19 MED ORDER — LIDOCAINE HCL (PF) 1 % IJ SOLN: 30.0000 mL | INTRAMUSCULAR | Status: DC | PRN

## 2019-02-19 MED ORDER — MAGNESIUM HYDROXIDE 400 MG/5ML PO SUSP: 30.0000 mL | ORAL | Status: DC | PRN

## 2019-02-19 MED ORDER — ONDANSETRON HCL 4 MG PO TABS: 4.0000 mg | ORAL_TABLET | ORAL | Status: DC | PRN

## 2019-02-19 MED ORDER — WITCH HAZEL-GLYCERIN EX PADS: 1.0000 "application " | MEDICATED_PAD | CUTANEOUS | Status: DC | PRN

## 2019-02-19 MED ORDER — LACTATED RINGERS IV SOLN
INTRAVENOUS | Status: DC
  Administered 2019-02-19: 18:00:00 via INTRAVENOUS

## 2019-02-19 MED ORDER — OXYTOCIN BOLUS FROM INFUSION
500.0000 mL | Freq: Once | INTRAVENOUS | Status: AC
  Administered 2019-02-19: 500 mL via INTRAVENOUS

## 2019-02-19 MED ORDER — OXYCODONE-ACETAMINOPHEN 5-325 MG PO TABS: 1.0000 | ORAL_TABLET | ORAL | Status: DC | PRN

## 2019-02-19 MED ORDER — PRENATAL MULTIVITAMIN CH
1.0000 | ORAL_TABLET | Freq: Every day | ORAL | Status: DC
  Administered 2019-02-20: 1 via ORAL
  Filled 2019-02-19 (×2): qty 1

## 2019-02-19 MED ORDER — LACTATED RINGERS IV SOLN: 500.0000 mL | INTRAVENOUS | Status: DC | PRN

## 2019-02-19 MED ORDER — COCONUT OIL OIL: 1.0000 "application " | TOPICAL_OIL | Status: DC | PRN

## 2019-02-19 MED ORDER — SOD CITRATE-CITRIC ACID 500-334 MG/5ML PO SOLN: 30.0000 mL | ORAL | Status: DC | PRN

## 2019-02-19 MED ORDER — OXYTOCIN 40 UNITS IN NORMAL SALINE INFUSION - SIMPLE MED
2.5000 [IU]/h | INTRAVENOUS | Status: DC
  Administered 2019-02-19: 21:00:00 2.5 [IU]/h via INTRAVENOUS
  Filled 2019-02-19: qty 1000

## 2019-02-19 NOTE — Discharge Instructions (Signed)

## 2019-02-19 NOTE — MAU Provider Note (Signed)
History   [id number]   Chief Complaint  Patient presents with  . Contractions  . Vaginal Bleeding    HPI Priscilla Delgado is a 38 y.o. female  [phone] @39 .2 wks here with report of leaking pink mucous fluid since 8am.  Leaking of fluid has continued. Pt reports contractions q7-8 min. Last intercourse was not recent. She reports good fetal movement. All other systems negative.    Patient's last menstrual period was 05/27/2018 (exact date).  OB History  Gravida Para Term Preterm AB Living  4 3 3  0 0 3  SAB TAB Ectopic Multiple Live Births  0 0 0 0 3    # Outcome Date GA Lbr Len/2nd Weight Sex Delivery Anes PTL Lv  4 Current           3 Term 03/14/15 2774w1d 04:09 / 00:15 2975 g M VBAC None  LIV  2 Term 07/03/10 4071w0d  2500 g F Vag-Spont   LIV  1 Term 10/19/08 3571w0d  3000 g M CS-Unspec Spinal  LIV    Obstetric Comments  C/s for breech- leg was coming down    Past Medical History:  Diagnosis Date  . Ulcer of the stomach and intestine     No family history on file.  Social History   Socioeconomic History  . Marital status: Married    Spouse name: Not on file  . Number of children: Not on file  . Years of education: Not on file  . Highest education level: Not on file  Occupational History  . Not on file  Social Needs  . Financial resource strain: Not on file  . Food insecurity:    Worry: Sometimes true    Inability: Sometimes true  . Transportation needs:    Medical: No    Non-medical: No  Tobacco Use  . Smoking status: Never Smoker  . Smokeless tobacco: Never Used  Substance and Sexual Activity  . Alcohol use: No  . Drug use: No  . Sexual activity: Not Currently    Birth control/protection: None  Lifestyle  . Physical activity:    Days per week: Not on file    Minutes per session: Not on file  . Stress: Not on file  Relationships  . Social connections:    Talks on phone: Not on file    Gets together: Not on file    Attends religious service: Not on  file    Active member of club or organization: Not on file    Attends meetings of clubs or organizations: Not on file    Relationship status: Not on file  Other Topics Concern  . Not on file  Social History Narrative  . Not on file    No Known Allergies  No current facility-administered medications on file prior to encounter.    Current Outpatient Medications on File Prior to Encounter  Medication Sig Dispense Refill  . acetaminophen (TYLENOL) 500 MG tablet Take 2 tablets (1,000 mg total) by mouth every 6 (six) hours as needed for moderate pain. 30 tablet 0  . Blood Pressure Monitoring (BLOOD PRESSURE CUFF) MISC 1 Device by Does not apply route 2 (two) times a week. 1 each 0  . docusate sodium (COLACE) 100 MG capsule Take 1 capsule (100 mg total) by mouth 2 (two) times daily as needed. 30 capsule 2  . Prenatal Vit-Fe Fumarate-FA (PRENATAL COMPLETE) 14-0.4 MG TABS Take 1 tablet by mouth daily. 30 each 2     Review of Systems  Gastrointestinal: Negative for abdominal pain.  Genitourinary: Positive for vaginal discharge.   Physical Exam   Vitals:   02/19/19 1353  BP: 131/69  Pulse: 74  Resp: 18  Temp: 98.4 F (36.9 C)  TempSrc: Oral  SpO2: 100%  Weight: 126.3 kg    Physical Exam  Constitutional: She is oriented to person, place, and time. She appears well-developed and well-nourished. No distress.  HENT:  Head: Normocephalic and atraumatic.  Neck: Normal range of motion.  Cardiovascular: Normal rate.  Respiratory: Effort normal. No respiratory distress.  Genitourinary:    Genitourinary Comments: SSE: +pool thin pink fluid, fern neg    Musculoskeletal: Normal range of motion.  Neurological: She is alert and oriented to person, place, and time.  Skin: Skin is warm and dry.  Psychiatric: She has a normal mood and affect.  EFM: 135 bpm, mod variability, + accels, no decels Toco: rare  Results for orders placed or performed during the hospital encounter of 02/19/19  (from the past 24 hour(s))  Amnisure rupture of membrane (rom)not at [facility]     Status: None   Collection Time: 02/19/19  2:49 PM  Result Value Ref Range   Amnisure ROM NEGATIVE    MAU Course  Procedures  MDM Labs ordered and reviewed. No evidence of ROM. Will recheck cervix to evaluate for active labor.  1700: Cervix rechecked, now 6/70/-3, plan for admit.  Assessment and Plan  [redacted] weeks gestation Active labor Admit to LD Mngt per labor team  Donette Larry, [place] 02/19/2019 5:10 PM

## 2019-02-19 NOTE — Discharge Summary (Signed)
Obstetrics Discharge Summary [facility]N [facility]   Patient Name: Priscilla Delgado DOB: May 01, 1981 MRN: [id number]  Date of admission: 02/19/2019 Delivering MD: Calvert Cantor   Date of discharge: 02/21/2019  Admitting diagnosis: Pain Leakage Intrauterine pregnancy: [redacted]w[redacted]d     Secondary diagnosis:   Active Problems:   Previous cesarean section complicating pregnancy   Advanced maternal age in multigravida   Maternal obesity affecting pregnancy, antepartum   Indication for care in labor or delivery   VBAC (vaginal birth after Cesarean)   Additional problems:  . AMA . Language barrier     Discharge diagnosis: VBAC                                            Postpartum procedures: None  Complications: none  Outpatient Follow-Up: 4-6 weeks [facility] Declines contraception  Hospital course: Priscilla Delgado is a 38 y.o. [redacted]w[redacted]d who was admitted for SOL. Her pregnancy was complicated by the following: has Previous cesarean section complicating pregnancy; Supervision of low-risk pregnancy; Advanced maternal age in multigravida; Maternal obesity affecting pregnancy, antepartum; Indication for care in labor or delivery; and VBAC (vaginal birth after Cesarean) on their problem list.  . Her labor course was notable for spontaneous onset of labor, and desire for Trial of Labor after Cesarean (previous). Delivery was uncomplicated and VBAC was achieved Please see delivery note for additional details. Her postpartum course was uncomplicated. She was breastfeeding without difficulty. By day of discharge, she was passing flatus, urinating, eating and drinking without difficulty. Her pain was well-controlled, and she was discharged home with infant. She will follow-up in clinic in 4 weeks.   Physical exam  Vitals:   02/20/19 0715 02/20/19 1403 02/20/19 2126 02/21/19 0539  BP: 111/72 (!) 102/56 107/69 (!) 95/53  Pulse: 71 77 61 66  Resp: Temp: 98.2 F (36.8 C) 98.7 F (37.1 C) 98.6 F  (37 C) 98.3 F (36.8 C)  TempSrc: Oral Oral Oral Oral  SpO2:      Weight:      Height:       General: alert and oriented in NAD Lochia: appropriate Uterine Fundus: firm Incision: N/A DVT Evaluation: No evidence of DVT seen on physical exam. Labs: Lab Results  Component Value Date   WBC 8.5 02/19/2019   HGB 11.9 (L) 02/19/2019   HCT 35.8 (L) 02/19/2019   MCV 91.6 02/19/2019   PLT 143 (L) 02/19/2019   CMP Latest Ref Rng & Units 10/10/2018  Glucose 70 - 99 mg/dL 409(W)  BUN 6 - 20 mg/dL 10  Creatinine 1.19 - 1.47 mg/dL 8.29  Sodium 562 - 130 mmol/L 138  Potassium 3.5 - 5.1 mmol/L 3.8  Chloride 98 - 111 mmol/L 105  CO2 22 - 32 mmol/L 24  Calcium 8.9 - 10.3 mg/dL 9.0  Total Protein 6.5 - 8.1 g/dL 6.6  Total Bilirubin 0.3 - 1.2 mg/dL 0.5  Alkaline Phos 38 - 126 U/L 56  AST 15 - 41 U/L 16  ALT 0 - 44 U/L 13    Discharge instructions: Per After Visit Summary and "Baby and Me Booklet"  After visit meds:  Allergies as of 02/21/2019   No Known Allergies     Medication List    STOP taking these medications   acetaminophen 500 MG tablet Commonly known as:  TYLENOL   Blood Pressure Cuff Misc  docusate sodium 100 MG capsule Commonly known as:  COLACE     TAKE these medications   ibuprofen 600 MG tablet Commonly known as:  ADVIL Take 1 tablet (600 mg total) by mouth every 6 (six) hours.   Prenatal Complete 14-0.4 MG Tabs Take 1 tablet by mouth daily.       Postpartum contraception: None  declined Diet: Routine Diet Activity: Advance as tolerated. Pelvic rest for 6 weeks.   Follow-up Appt: Future Appointments  Date Time Provider Department Center  04/12/2019 12:30 PM [facility] [place] [facility]   Follow-up Visit:No follow-ups on file.  Newborn Data: Live born female  Birth Weight:   APGAR: 8, 9  Newborn Delivery   Birth date/time:  02/19/2019 20:07:00 Delivery type:  VBAC, Spontaneous     Baby Feeding: Breast Disposition:home with  mother

## 2019-02-19 NOTE — H&P (Signed)
Priscilla Delgado is a 38 y.o. female presenting for SOL. She receives prenatal care at [facility]. Her dating is based on 21 weeks ultrasound. She reports painful abdominal contractions and leaking of fluid throughout the day today. She denies vaginal bleeding,  decreased fetal movement, fever, falls, or recent illness.    Patient's history is significant for primary  c section for footling breech followed by two successful vaginal deliveries.  OB History    Gravida  4   Para  3   Term  3   Preterm  0   AB  0   Living  3     SAB  0   TAB  0   Ectopic  0   Multiple  0   Live Births  3        Obstetric Comments  C/s for breech- leg was coming down       Patient Active Problem List   Diagnosis Date Noted  . Advanced maternal age in multigravida 02/19/2019  . Maternal obesity affecting pregnancy, antepartum 02/19/2019  . Indication for care in labor or delivery 02/19/2019  . Supervision of low-risk pregnancy 11/27/2018  . Previous cesarean section complicating pregnancy 01/30/2015    Past Medical History:  Diagnosis Date  . Ulcer of the stomach and intestine    Past Surgical History:  Procedure Laterality Date  . CESAREAN SECTION     Family History: family history is not on file. Social History:  reports that she has never smoked. She has never used smokeless tobacco. She reports that she does not drink alcohol or use drugs.    Maternal Diabetes: No Genetic Screening: late prenatal care Maternal Ultrasounds/Referrals: Normal Fetal Ultrasounds or other Referrals:  None Maternal Substance Abuse:  No Significant Maternal Medications:  None Significant Maternal Lab Results:  None Other Comments:  GBS NEGATIVE  Review of Systems  Constitutional: Negative for fever.  Respiratory: Negative for cough.   Gastrointestinal: Positive for abdominal pain.  Neurological: Negative for headaches.  All other systems reviewed and are negative.  Maternal Medical History:   Reason for admission: Contractions.   Contractions: Onset was 6-12 hours ago.   Frequency: regular.   Perceived severity is strong.    Fetal activity: Perceived fetal activity is normal.   Last perceived fetal movement was within the past hour.    Prenatal complications: no prenatal complications Prenatal Complications - Diabetes: none.    Dilation: 6 Effacement (%): 70 Station: -3 Exam by:: Ginnie Smart RN Blood pressure 131/69, pulse 74, temperature 98.4 F (36.9 C), temperature source Oral, resp. rate 18, weight 126.3 kg, last menstrual period 05/27/2018, SpO2 100 %, currently breastfeeding.   Maternal Exam:  Abdomen: Patient reports no abdominal tenderness. Fetal presentation: vertex  Introitus: Ferning test: negative.  Nitrazine test: not done.  Pelvis: adequate for delivery.   Cervix: Cervix evaluated by digital exam.     Physical Exam  Nursing note and vitals reviewed. Constitutional: She is oriented to person, place, and time. She appears well-developed and well-nourished.  Cardiovascular: Normal rate.  Respiratory: Effort normal. No respiratory distress.  GI: She exhibits no distension. There is no abdominal tenderness. There is no rebound and no guarding.  Neurological: She is alert and oriented to person, place, and time.  Skin: Skin is warm and dry.  Psychiatric: She has a normal mood and affect. Her behavior is normal. Judgment and thought content normal.    Fetal Well Being Category I tracing: baseline 130, moderate variability, positive accels,  no decels Toco: irregular contractions q 3-4 min  Prenatal labs: ABO, Rh: O/Positive/-- (02/03 1127) Antibody: Negative (02/03 1127) Rubella: 19.60 (02/03 1127) RPR: Non Reactive (02/19 0850)  HBsAg: Negative (02/03 1127)  HIV: Non Reactive (02/19 0850)  GBS:   NEGATIVE  Assessment/Plan: --38 y.o. G4P3003 at 8524w2d  --Patient continues to desire TOLAC --SOL, now reporting intermittent perineal  pressure --GBS Negative -AROM with next exam --Anticipate TOLAC-->VBAC Gender unknown/breast/does not desire contraception  Calvert CantorSamantha C Javaris Wigington, CNM 02/19/2019, 7:15 PM

## 2019-02-19 NOTE — MAU Note (Signed)
Pt states she's having ctx 7-8 min apart, some water mixed with blood leaking.

## 2019-02-20 LAB — RPR: RPR Ser Ql: NONREACTIVE

## 2019-02-20 LAB — ABO/RH: ABO/RH(D): O POS

## 2019-02-20 NOTE — Progress Notes (Signed)
Post Partum Day 1 Subjective: no complaints  Objective: Blood pressure (!) 108/57, pulse 67, temperature 98 F (36.7 C), temperature source Oral, resp. rate 18, height 5\' 7"  (1.702 m), weight 126.4 kg, last menstrual period 05/27/2018, SpO2 100 %, unknown if currently breastfeeding.  Physical Exam:  General: alert, cooperative and no distress Lochia: appropriate Uterine Fundus: firm Incision: none DVT Evaluation: No evidence of DVT seen on physical exam.  Recent Labs    02/19/19 1809  HGB 11.9*  HCT 35.8*    Assessment/Plan: Plan for discharge tomorrow and Breastfeeding   LOS: 1 day   Scheryl Darter 02/20/2019, 9:09 AM

## 2019-02-20 NOTE — Lactation Note (Signed)
This note was copied from a baby's chart. Lactation Consultation Note  Patient Name: Priscilla Delgado OOILN'Z Date: 02/20/2019 Reason for consult: Initial assessment;Term  Baby is 66 hours old  As LC entered the room baby latched in a semi sitting position / cradle.  LC noted flanged lips/ swallows and per mom comfortable.  Baby released after 17 mins and nipple slanted and small blister noted.  Baby still hungry and LC assisted with adding support/ and assisted mom  In the cross cradle same breast due the areola being compressible and easily  Expressed colostrum. Baby latched much deeper and was more nutritive  Per mom more comfortable. Baby still feeding in a nutritive feeding pattern at 10 mins.  Prior to the 2nd latch / LC reviewed importance of obtaining a deep latch to prevent soreness And to enhance let down.  LC reviewed the importance of STS feedings until the baby is back to birth weight / gaining steadily/  And can stay awake for majority of feeding.  Per mom active with [facility] / will need a hand pump prior to D/C.  LC reviewed the [facility] LC resources after D/C.     Maternal Data Has patient been taught Hand Expression?: Yes(reviewed / several drops prior to latch ) Does the patient have breastfeeding experience prior to this delivery?: Yes  Feeding Feeding Type: Breast Fed  LATCH Score Latch: Grasps breast easily, tongue down, lips flanged, rhythmical sucking.  Audible Swallowing: A few with stimulation  Type of Nipple: Everted at rest and after stimulation  Comfort (Breast/Nipple): Soft / non-tender  Hold (Positioning): Assistance needed to correctly position infant at breast and maintain latch.  LATCH Score: 8  Interventions Interventions: Breast feeding basics reviewed;Assisted with latch;Skin to skin;Breast massage;Hand express;Breast compression;Adjust position;Support pillows;Position options  Lactation Tools Discussed/Used WIC Program: Yes(per  mom )   Consult Status Consult Status: Follow-up Date: 02/21/19 Follow-up type: In-patient    [name] 02/20/2019, 11:15 AM

## 2019-02-21 ENCOUNTER — Encounter (HOSPITAL_COMMUNITY): Payer: Self-pay | Admitting: *Deleted

## 2019-02-21 DIAGNOSIS — O34219 Maternal care for unspecified type scar from previous cesarean delivery: Secondary | ICD-10-CM | POA: Diagnosis not present

## 2019-02-21 MED ORDER — IBUPROFEN 600 MG PO TABS: 600.0000 mg | ORAL_TABLET | Freq: Four times a day (QID) | ORAL | 0 refills | Status: DC

## 2019-02-21 NOTE — Progress Notes (Signed)
CSW received consult due to score 11 on Edinburgh Depression Screen.    CSW spoke with MOB at bedside. CSW observed that MOB had company in the room. CSW explained role to MOB and asked that other parties in theroom step out. MOB requested that sister, who was in room with MOB remain in theroom. CSW congratulated MOB on the birth if infant (Leviticus Yamo). CSW explained to MOB the reason for visit. MOB expressed that's he answered the questions incorrectly as she answered them based off the last 9 months. MOB reported that she had been really tired from being pregnant and suggested that's he is fine now as she has had infant. MOB also expressed that she is connect with [employer] and that they help but not much. CSW offered MOB other community resources as MOB reports that she is a single mom with help from sister. MOB declined any further resources.  CSW expressed verbal understanding and asked for further permission to discuss further eduction with MOB on infant. MOB reluctant but also expressed a desire to listen to information from CSW. CSW discussed SIDS as well as PPD signs and symptoms.  CSW provided education regarding Baby Blues vs PMADs and provided MOB with resources for mental health follow up.  CSW encouraged MOB to evaluate her mental health throughout the postpartum period with the use of the New Mom Checklist developed by [employer]partum [employer] as well as the Edinburgh Postnatal Depression Scale and notify a medical professional if symptoms arise.      Luther Newhouse S. Mauriah Mcmillen, MSW, LCSW-A [facility] [place] [phone] 

## 2019-02-23 ENCOUNTER — Inpatient Hospital Stay (HOSPITAL_COMMUNITY)
Admission: AD | Admit: 2019-02-23 | Discharge: 2019-02-23 | Disposition: A | Discharge status: Home / Self Care | Payer: Medicaid Other | Attending: Obstetrics and Gynecology | Admitting: Obstetrics and Gynecology

## 2019-02-23 ENCOUNTER — Encounter (HOSPITAL_COMMUNITY): Payer: Self-pay | Admitting: *Deleted

## 2019-02-23 ENCOUNTER — Other Ambulatory Visit: Payer: Self-pay

## 2019-02-23 DIAGNOSIS — R509 Fever, unspecified: Secondary | ICD-10-CM | POA: Diagnosis not present

## 2019-02-23 DIAGNOSIS — R51 Headache: Secondary | ICD-10-CM | POA: Insufficient documentation

## 2019-02-23 DIAGNOSIS — O34219 Maternal care for unspecified type scar from previous cesarean delivery: Secondary | ICD-10-CM | POA: Diagnosis not present

## 2019-02-23 DIAGNOSIS — O9989 Other specified diseases and conditions complicating pregnancy, childbirth and the puerperium: Secondary | ICD-10-CM | POA: Insufficient documentation

## 2019-02-23 DIAGNOSIS — R600 Localized edema: Secondary | ICD-10-CM

## 2019-02-23 DIAGNOSIS — M549 Dorsalgia, unspecified: Secondary | ICD-10-CM | POA: Insufficient documentation

## 2019-02-23 NOTE — MAU Note (Signed)
Pt states she took 4oomg ibuprofen at 1500 and her headache and back have resolved. She now just complains of swelling in her feet.

## 2019-02-23 NOTE — MAU Note (Signed)
Pt ready to be discharged and go to peds to see her hospitalized infant. Informed provider of her readiness and concern.

## 2019-02-23 NOTE — MAU Note (Addendum)
Vag del 4/27.Started getting headaches almost every day (none right now since she took Ibuprofen).  Back pain, swollen feet, fever at times.  This has been going on since Wed.  Denies visual changes or epigastric pain

## 2019-02-23 NOTE — MAU Provider Note (Signed)
History     CSN: [id number]  Arrival date and time: 02/23/19 [id number]   First Provider Initiated Contact with Patient 02/23/19 1935      Chief Complaint  Patient presents with  . Headache  . Fever  . Back Pain  . Leg Swelling  . Foot Swelling   Priscilla Delgado is a 38 y.o. [id number] at Unknown who presents for Headache; Fever; Back Pain; Leg Swelling; and Foot Swelling.  Patient states she has come in for "after delivery pains," but is not currently experiencing pain.  She states she has had a fever and chills, but has not taken her temperature.  She reports she "sickness in her bones" yesterday evening, but has not experienced these symptoms today.  She reports the back pain is intermittent in nature and causes restrictions in movement, but reports she doesn't have any pain now.  She also reports a headache this morning, but none currently.  Patient states she took ibuprofen  this morning (600mg ) and this afternoon (400mg ) with resolution of symptoms. Patient is breastfeeding and states that her infant son is currently in the NICU and she has been visiting him daily since her discharge.  She reports that the swelling in her feet as increased, but was present prior to discharge.      OB History    Gravida  4   Para  4   Term  4   Preterm  0   AB  0   Living  4     SAB  0   TAB  0   Ectopic  0   Multiple  0   Live Births  4        Obstetric Comments  C/s for breech- leg was coming down        Past Medical History:  Diagnosis Date  . Ulcer of the stomach and intestine     Past Surgical History:  Procedure Laterality Date  . CESAREAN SECTION      History reviewed. No pertinent family history.  Social History   Tobacco Use  . Smoking status: Never Smoker  . Smokeless tobacco: Never Used  Substance Use Topics  . Alcohol use: No  . Drug use: No    Allergies: No Known Allergies  Medications Prior to Admission  Medication Sig Dispense Refill Last Dose   . ibuprofen (ADVIL) 600 MG tablet Take 1 tablet (600 mg total) by mouth every 6 (six) hours. 30 tablet 0 02/23/2019 at 1400  . Prenatal Vit-Fe Fumarate-FA (PRENATAL COMPLETE) 14-0.4 MG TABS Take 1 tablet by mouth daily. 30 each 2 02/23/2019 at Unknown time    Review of Systems  Constitutional: Negative for chills and fever.  Respiratory: Negative for cough and shortness of breath.   Gastrointestinal: Negative for abdominal pain, constipation, diarrhea, nausea and vomiting.  Neurological: Negative for dizziness, light-headedness and headaches.   Physical Exam   Blood pressure 120/64, pulse 65, temperature 98.7 F (37.1 C), temperature source Oral, resp. rate 18, SpO2 99 %, currently breastfeeding.  Physical Exam  Constitutional: She is oriented to person, place, and time. She appears well-developed and well-nourished.  HENT:  Head: Normocephalic and atraumatic.  Eyes: Conjunctivae are normal.  Neck: Normal range of motion.  Cardiovascular: Normal rate, regular rhythm and normal heart sounds.  Respiratory: Effort normal and breath sounds normal.  GI: Soft. Bowel sounds are normal.  Musculoskeletal: Normal range of motion.        General: Edema (+1 pitting in BLE) present.  Neurological: She is alert and oriented to person, place, and time.  Skin: Skin is warm and dry.  Psychiatric: She has a normal mood and affect. Her behavior is normal.    MAU Course  Procedures  MDM PE Education Assessment and Plan  38 year old Postpartum State Pedal Edema Afebrile  -Exam findings and vital signs discussed. -Reassurances given. -Informed that symptoms of ill are sometimes experienced when milk comes in and resolves spontaneously.  -Educated on methods to improve including elevation of feet and proper hydration for edema. Discussed normalcy of increased edema in immediate postpartum period. -Instructed to continue to treat symptoms of pain with ibuprofen as appropriate.  Encouraged to  contact office if symptoms unrelieved with treatment. -Patient without questions or concerns. -Encouraged to call or return to MAU if symptoms worsen or with the onset of new symptoms. -Discharged to home in stable condition   Cherre Robins MSN, CNM 02/23/2019, 7:35 PM

## 2019-02-23 NOTE — Discharge Instructions (Signed)
Postpartum Care After Vaginal Delivery °This sheet gives you information about how to care for yourself from the time you deliver your baby to up to 6-12 weeks after delivery (postpartum period). Your health care provider may also give you more specific instructions. If you have problems or questions, contact your health care provider. °Follow these instructions at home: °Vaginal bleeding °· It is normal to have vaginal bleeding (lochia) after delivery. Wear a sanitary pad for vaginal bleeding and discharge. °? During the first week after delivery, the amount and appearance of lochia is often similar to a menstrual period. °? Over the next few weeks, it will gradually decrease to a dry, yellow-brown discharge. °? For most women, lochia stops completely by 4-6 weeks after delivery. Vaginal bleeding can vary from woman to woman. °· Change your sanitary pads frequently. Watch for any changes in your flow, such as: °? A sudden increase in volume. °? A change in color. °? Large blood clots. °· If you pass a blood clot from your vagina, save it and call your health care provider to discuss. Do not flush blood clots down the toilet before talking with your health care provider. °· Do not use tampons or douches until your health care provider says this is safe. °· If you are not breastfeeding, your period should return 6-8 weeks after delivery. If you are feeding your child breast milk only (exclusive breastfeeding), your period may not return until you stop breastfeeding. °Perineal care °· Keep the area between the vagina and the anus (perineum) clean and dry as told by your health care provider. Use medicated pads and pain-relieving sprays and creams as directed. °· If you had a cut in the perineum (episiotomy) or a tear in the vagina, check the area for signs of infection until you are healed. Check for: °? More redness, swelling, or pain. °? Fluid or blood coming from the cut or tear. °? Warmth. °? Pus or a bad  smell. °· You may be given a squirt bottle to use instead of wiping to clean the perineum area after you go to the bathroom. As you start healing, you may use the squirt bottle before wiping yourself. Make sure to wipe gently. °· To relieve pain caused by an episiotomy, a tear in the vagina, or swollen veins in the anus (hemorrhoids), try taking a warm sitz bath 2-3 times a day. A sitz bath is a warm water bath that is taken while you are sitting down. The water should only come up to your hips and should cover your buttocks. °Breast care °· Within the first few days after delivery, your breasts may feel heavy, full, and uncomfortable (breast engorgement). Milk may also leak from your breasts. Your health care provider can suggest ways to help relieve the discomfort. Breast engorgement should go away within a few days. °· If you are breastfeeding: °? Wear a bra that supports your breasts and fits you well. °? Keep your nipples clean and dry. Apply creams and ointments as told by your health care provider. °? You may need to use breast pads to absorb milk that leaks from your breasts. °? You may have uterine contractions every time you breastfeed for up to several weeks after delivery. Uterine contractions help your uterus return to its normal size. °? If you have any problems with breastfeeding, work with your health care provider or lactation consultant. °· If you are not breastfeeding: °? Avoid touching your breasts a lot. Doing this can make   your breasts produce more milk. °? Wear a good-fitting bra and use cold packs to help with swelling. °? Do not squeeze out (express) milk. This causes you to make more milk. °Intimacy and sexuality °· Ask your health care provider when you can engage in sexual activity. This may depend on: °? Your risk of infection. °? How fast you are healing. °? Your comfort and desire to engage in sexual activity. °· You are able to get pregnant after delivery, even if you have not had  your period. If desired, talk with your health care provider about methods of birth control (contraception). °Medicines °· Take over-the-counter and prescription medicines only as told by your health care provider. °· If you were prescribed an antibiotic medicine, take it as told by your health care provider. Do not stop taking the antibiotic even if you start to feel better. °Activity °· Gradually return to your normal activities as told by your health care provider. Ask your health care provider what activities are safe for you. °· Rest as much as possible. Try to rest or take a nap while your baby is sleeping. °Eating and drinking ° °· Drink enough fluid to keep your urine pale yellow. °· Eat high-fiber foods every day. These may help prevent or relieve constipation. High-fiber foods include: °? Whole grain cereals and breads. °? Brown rice. °? Beans. °? Fresh fruits and vegetables. °· Do not try to lose weight quickly by cutting back on calories. °· Take your prenatal vitamins until your postpartum checkup or until your health care provider tells you it is okay to stop. °Lifestyle °· Do not use any products that contain nicotine or tobacco, such as cigarettes and e-cigarettes. If you need help quitting, ask your health care provider. °· Do not drink alcohol, especially if you are breastfeeding. °General instructions °· Keep all follow-up visits for you and your baby as told by your health care provider. Most women visit their health care provider for a postpartum checkup within the first 3-6 weeks after delivery. °Contact a health care provider if: °· You feel unable to cope with the changes that your child brings to your life, and these feelings do not go away. °· You feel unusually sad or worried. °· Your breasts become red, painful, or hard. °· You have a fever. °· You have trouble holding urine or keeping urine from leaking. °· You have little or no interest in activities you used to enjoy. °· You have not  breastfed at all and you have not had a menstrual period for 12 weeks after delivery. °· You have stopped breastfeeding and you have not had a menstrual period for 12 weeks after you stopped breastfeeding. °· You have questions about caring for yourself or your baby. °· You pass a blood clot from your vagina. °Get help right away if: °· You have chest pain. °· You have difficulty breathing. °· You have sudden, severe leg pain. °· You have severe pain or cramping in your lower abdomen. °· You bleed from your vagina so much that you fill more than one sanitary pad in one hour. Bleeding should not be heavier than your heaviest period. °· You develop a severe headache. °· You faint. °· You have blurred vision or spots in your vision. °· You have bad-smelling vaginal discharge. °· You have thoughts about hurting yourself or your baby. °If you ever feel like you may hurt yourself or others, or have thoughts about taking your own life, get help   right away. You can go to the nearest emergency department or call:  Your local emergency services (911 in the [place]  A suicide crisis helpline, such as the National Suicide Prevention Lifeline at (401) 874-5020. This is open 24 hours a day. Summary  The period of time right after you deliver your newborn up to 6-12 weeks after delivery is called the postpartum period.  Gradually return to your normal activities as told by your health care provider.  Keep all follow-up visits for you and your baby as told by your health care provider. This information is not intended to replace advice given to you by your health care provider. Make sure you discuss any questions you have with your health care provider. Document Released: 08/08/2007 Document Revised: 07/25/2017 Document Reviewed: 07/25/2017 Elsevier Interactive Patient Education  2019 [employer] Peripheral Edema  Peripheral edema is swelling that is caused by a buildup of fluid. Peripheral edema most often  affects the lower legs, ankles, and feet. It can also develop in the arms, hands, and face. The area of the body that has peripheral edema will look swollen. It may also feel heavy or warm. Your clothes may start to feel tight. Pressing on the area may make a temporary dent in your skin. You may not be able to move your arm or leg as much as usual. There are many causes of peripheral edema. It can be a complication of other diseases, such as congestive heart failure, kidney disease, or a problem with your blood circulation. It also can be a side effect of certain medicines. It often happens to women during pregnancy. Sometimes, the cause is not known. Treating the underlying condition is often the only treatment for peripheral edema. Follow these instructions at home: Pay attention to any changes in your symptoms. Take these actions to help with your discomfort:  Raise (elevate) your legs while you are sitting or lying down.  Move around often to prevent stiffness and to lessen swelling. Do not sit or stand for long periods of time.  Wear support stockings as told by your health care provider.  Follow instructions from your health care provider about limiting salt (sodium) in your diet. Sometimes eating less salt can reduce swelling.  Take over-the-counter and prescription medicines only as told by your health care provider. Your health care provider may prescribe medicine to help your body get rid of excess water (diuretic).  Keep all follow-up visits as told by your health care provider. This is important. Contact a health care provider if:  You have a fever.  Your edema starts suddenly or is getting worse, especially if you are pregnant or have a medical condition.  You have swelling in only one leg.  You have increased swelling and pain in your legs. Get help right away if:  You develop shortness of breath, especially when you are lying down.  You have pain in your chest or  abdomen.  You feel weak.  You faint. This information is not intended to replace advice given to you by your health care provider. Make sure you discuss any questions you have with your health care provider. Document Released: 11/18/2004 Document Revised: 03/15/2016 Document Reviewed: 04/23/2015 Elsevier Interactive Patient Education  2019 [employer].

## 2019-02-24 ENCOUNTER — Ambulatory Visit: Payer: Self-pay

## 2019-02-24 NOTE — Lactation Note (Signed)
This note was copied from a baby's chart. Lactation Consultation Note  Patient Name: Priscilla Delgado Date: 02/24/2019 Reason for consult: Follow-up assessment  P4 mother whose infant is now 71 days old.  This infant was a readmit from the PCP with a bilirubin of 20.7 mg/dl.  Baby has been admitted for hyperbilirubinemia.  According to MD notes, mother was only breast feeding him two times/day and giving 2-3 one ounce bottles of [employer].  He has had decreased output.  His bilirubin level was 20.7 mg/dl on admission and this morning's bilirubin level was 15.6 mg/dl.    Mother was awake and baby was in the bassinet under phototherapy with goggles in place.  Mother's breasts are large, very full and nipples are intact.  Mother stated that she does not want to put baby to the breast, however, she desires to pump and bottle feed only.  I questioned her about the last time she used the DEBP and she said, "yesterday."  Her breasts are still soft, just very full.  Massaged breast and showed mother how to do breast compressions.  Her right breast was more full than the left breast.  Offered to assist with pumping and mother accepted.  #27 flange size is appropriate at this time.  Reviewed pump and set up with mother.  During the pumping session I assisted with massage to the right breast while mother massaged the left breast.  After the entire 15 minute pumping session mother was able to obtain 60 mls of EBM.  She has her own bottle and wanted me to place the milk in her bottle for the next feeding.  She felt relief after pumping and I encouraged pumping every 2-3 hours with hand expression and breast massage.  Mother verbalized understanding.  Mother has a manual pump but does not have a DEBP.  She participates in the [facility] program in [place].  I explained about the process there for obtaining a DEBP but reminded her that if she chooses the formula package she will not be able to get a  DEBP.  When asked what her preference was mother stated she wants to keep her formula package.  I suggested continuing to use our DEBP while in the hospital and to feed back any EBM she obtains to baby.  This will be mother's plan.  Nurse tech updated and will relay information to RN.  Mother will call for any other questions/concerns.  She is planning on getting a shower, eating lunch and will then feed baby again around 1300.    Maternal Data Formula Feeding for Exclusion: No Has patient been taught Hand Expression?: Yes Does the patient have breastfeeding experience prior to this delivery?: Yes  Feeding Feeding Type: Bottle Fed - Formula Nipple Type: Slow - flow  LATCH Score                   Interventions    Lactation Tools Discussed/Used WIC Program: Yes   Consult Status Consult Status: PRN Date: 02/24/19 Follow-up type: Call as needed    Dushawn Pusey R Maddix Heinz 02/24/2019, 11:58 AM

## 2019-03-28 ENCOUNTER — Telehealth: Payer: Self-pay | Admitting: Obstetrics & Gynecology

## 2019-03-28 ENCOUNTER — Ambulatory Visit: Payer: Medicaid Other | Admitting: Nurse Practitioner

## 2019-03-28 NOTE — Telephone Encounter (Signed)
Attempted to call patient about her appointment on tomorrow. I was not able to leave a VM.

## 2019-03-29 ENCOUNTER — Encounter: Payer: Self-pay | Admitting: Obstetrics and Gynecology

## 2019-03-29 ENCOUNTER — Ambulatory Visit: Payer: Medicaid Other | Admitting: Obstetrics and Gynecology

## 2019-03-29 ENCOUNTER — Telehealth: Payer: Self-pay | Admitting: Obstetrics and Gynecology

## 2019-03-29 ENCOUNTER — Other Ambulatory Visit: Payer: Self-pay

## 2019-03-29 ENCOUNTER — Ambulatory Visit: Payer: Medicaid Other | Admitting: Obstetrics & Gynecology

## 2019-03-29 NOTE — Progress Notes (Signed)
I attempted to connect with  Priscilla Delgado on 03/29/19 at  9:35 AM EDT by telephone and got a message stating that the person you've reached has a voicemail that hasn't been set up yet.   Janene Madeira Sterlin Knightly, CMA 03/29/2019  9:45 AM

## 2019-03-29 NOTE — Telephone Encounter (Signed)
Attempted to call patient to get her rescheduled for her postpartum visit with the [facility] interpreter ID # [phone]. No answer and voicemail was not setup to leave a message. No show letter mailed

## 2019-03-29 NOTE — Progress Notes (Signed)
Patient did not keep her postpartum visit today.   Venia Carbon I, NP 03/29/2019 10:17 AM

## 2019-04-12 ENCOUNTER — Other Ambulatory Visit: Payer: Medicaid Other

## 2019-07-04 ENCOUNTER — Encounter (HOSPITAL_COMMUNITY): Payer: Self-pay | Admitting: *Deleted

## 2019-07-04 ENCOUNTER — Encounter (HOSPITAL_COMMUNITY): Payer: Self-pay

## 2019-10-26 NOTE — L&D Delivery Note (Signed)
[facility] Delivery Note  Priscilla Delgado is a 39 y.o. [id number] s/p VBAC at [redacted]w[redacted]d. She was admitted for IOL for non-reactive NST.   ROM: 0h 31m with clear fluid GBS Status: Negative/-- (05/17 1153) Maximum Maternal Temperature: 98.7 F    Labor Progress:  Patient arrived at 4 cm dilation and was induced with pitocin. She progressed to 5 cm and then had AROM for clear, and precipitously progressed to NSVD 25 minutes later.   Delivery Date/Time: 03/27/2020 at 0640 Delivery: Called to room and patient was complete and pushing. Head delivered in OA position. No nuchal cord present. Shoulder and body delivered in usual fashion. Infant with spontaneous cry, placed on mother's abdomen, dried and stimulated. Cord clamped x 2 after 1-minute delay, and cut by FOB. Cord blood drawn. Placenta delivered spontaneously with gentle cord traction. Fundus firm with massage and Pitocin. Labia, perineum, vagina, and cervix inspected with no lacerations.   Placenta: intact 3v, to L&D Complications: none Lacerations: none EBL: 500 cc Analgesia: none   Infant: APGAR (1 MIN): 9   APGAR (5 MINS): 9    Weight: 3260 grams  Zack Seal, MD/[facility] OB/GYN Fellow, Faculty Practice

## 2019-10-29 ENCOUNTER — Other Ambulatory Visit: Payer: Self-pay

## 2019-10-29 ENCOUNTER — Ambulatory Visit (HOSPITAL_COMMUNITY)
Admission: EM | Admit: 2019-10-29 | Discharge: 2019-10-29 | Disposition: A | Discharge status: Home / Self Care | Payer: Medicaid Other | Attending: Family Medicine | Admitting: Family Medicine

## 2019-10-29 ENCOUNTER — Encounter (HOSPITAL_COMMUNITY): Payer: Self-pay | Admitting: Family Medicine

## 2019-10-29 ENCOUNTER — Encounter (HOSPITAL_COMMUNITY): Payer: Self-pay

## 2019-10-29 ENCOUNTER — Inpatient Hospital Stay (HOSPITAL_COMMUNITY)
Admission: AD | Admit: 2019-10-29 | Discharge: 2019-10-29 | Disposition: A | Discharge status: Home / Self Care | Payer: Medicaid Other | Attending: Family Medicine | Admitting: Family Medicine

## 2019-10-29 ENCOUNTER — Inpatient Hospital Stay (HOSPITAL_BASED_OUTPATIENT_CLINIC_OR_DEPARTMENT_OTHER): Payer: Medicaid Other

## 2019-10-29 DIAGNOSIS — Z3201 Encounter for pregnancy test, result positive: Secondary | ICD-10-CM

## 2019-10-29 DIAGNOSIS — R1030 Lower abdominal pain, unspecified: Secondary | ICD-10-CM

## 2019-10-29 DIAGNOSIS — O99891 Other specified diseases and conditions complicating pregnancy: Secondary | ICD-10-CM | POA: Insufficient documentation

## 2019-10-29 DIAGNOSIS — Z3686 Encounter for antenatal screening for cervical length: Secondary | ICD-10-CM

## 2019-10-29 DIAGNOSIS — O09522 Supervision of elderly multigravida, second trimester: Secondary | ICD-10-CM | POA: Diagnosis not present

## 2019-10-29 DIAGNOSIS — O26892 Other specified pregnancy related conditions, second trimester: Secondary | ICD-10-CM | POA: Diagnosis not present

## 2019-10-29 DIAGNOSIS — M545 Low back pain, unspecified: Secondary | ICD-10-CM

## 2019-10-29 DIAGNOSIS — Z3A17 17 weeks gestation of pregnancy: Secondary | ICD-10-CM | POA: Diagnosis not present

## 2019-10-29 DIAGNOSIS — R102 Pelvic and perineal pain: Secondary | ICD-10-CM | POA: Insufficient documentation

## 2019-10-29 DIAGNOSIS — O34219 Maternal care for unspecified type scar from previous cesarean delivery: Secondary | ICD-10-CM | POA: Insufficient documentation

## 2019-10-29 DIAGNOSIS — M549 Dorsalgia, unspecified: Secondary | ICD-10-CM | POA: Diagnosis not present

## 2019-10-29 DIAGNOSIS — O26899 Other specified pregnancy related conditions, unspecified trimester: Secondary | ICD-10-CM

## 2019-10-29 DIAGNOSIS — M5430 Sciatica, unspecified side: Secondary | ICD-10-CM | POA: Diagnosis not present

## 2019-10-29 DIAGNOSIS — R109 Unspecified abdominal pain: Secondary | ICD-10-CM

## 2019-10-29 LAB — URINALYSIS, ROUTINE W REFLEX MICROSCOPIC
Bilirubin Urine: NEGATIVE
Glucose, UA: NEGATIVE mg/dL
Hgb urine dipstick: NEGATIVE
Ketones, ur: 5 mg/dL — AB
Nitrite: NEGATIVE
Protein, ur: 30 mg/dL — AB
Specific Gravity, Urine: 1.027 (ref 1.005–1.030)
pH: 5 (ref 5.0–8.0)

## 2019-10-29 LAB — POCT URINALYSIS DIP (DEVICE)
Bilirubin Urine: NEGATIVE
Glucose, UA: NEGATIVE mg/dL
Hgb urine dipstick: NEGATIVE
Ketones, ur: NEGATIVE mg/dL
Leukocytes,Ua: NEGATIVE
Nitrite: POSITIVE — AB
Protein, ur: NEGATIVE mg/dL
Specific Gravity, Urine: >=1.03 (ref 1.005–1.030)
Urobilinogen, UA: 0.2 mg/dL (ref 0.0–1.0)
pH: 6 (ref 5.0–8.0)

## 2019-10-29 LAB — WET PREP, GENITAL
Clue Cells Wet Prep HPF POC: NONE SEEN
Sperm: NONE SEEN
Trich, Wet Prep: NONE SEEN
Yeast Wet Prep HPF POC: NONE SEEN

## 2019-10-29 LAB — POC URINE PREG, ED: Preg Test, Ur: POSITIVE — AB

## 2019-10-29 LAB — POCT PREGNANCY, URINE: Preg Test, Ur: POSITIVE — AB

## 2019-10-29 MED ORDER — COMFORT FIT MATERNITY SUPP LG MISC: 1.0000 [IU] | Freq: Every day | 0 refills | Status: DC

## 2019-10-29 MED ORDER — ACETAMINOPHEN 500 MG PO TABS
1000.0000 mg | ORAL_TABLET | Freq: Once | ORAL | Status: AC
  Administered 2019-10-29: 14:00:00 1000 mg via ORAL
  Filled 2019-10-29: qty 2

## 2019-10-29 NOTE — Discharge Instructions (Addendum)
Please go now to the [facility] for further evaluation of your abdominal pain.

## 2019-10-29 NOTE — MAU Provider Note (Signed)
History     CSN: [id number]  Arrival date and time: 10/29/19 1227   First Provider Initiated Contact with Patient 10/29/19 1355      Chief Complaint  Patient presents with  . Back Pain   Priscilla Delgado is a 40 y.o. G5P4 at [redacted]w[redacted]d who presents to MAU from urgent care for complaints of back pain and lower abdominal pain. She reports back pain has been present for the past 2-3 weeks. She describes the back pain as constant aching - rates pain 6/10- has taken Tylenol for pain with slight relief. She reports back pain shoots down the back of her legs in different positions. She reports back pain is associated with lower abdominal pain. Describes lower abdominal pain as sharp in pelvic region that gets worse with movement and changing positions. She denies abdominal cramping or contraction like pain. Denies vaginal bleeding, discharge, urinary symptoms or recent IC. Recent delivery in April 2020.   OB History    Gravida  5   Para  4   Term  4   Preterm  0   AB  0   Living  4     SAB  0   TAB  0   Ectopic  0   Multiple  0   Live Births  4        Obstetric Comments  C/s for breech- leg was coming down        Past Medical History:  Diagnosis Date  . Ulcer of the stomach and intestine     Past Surgical History:  Procedure Laterality Date  . CESAREAN SECTION      No family history on file.  Social History   Tobacco Use  . Smoking status: Never Smoker  . Smokeless tobacco: Never Used  Substance Use Topics  . Alcohol use: No  . Drug use: No    Allergies: No Known Allergies  Medications Prior to Admission  Medication Sig Dispense Refill Last Dose  . acetaminophen (TYLENOL) 500 MG tablet Take 500 mg by mouth every 6 (six) hours as needed.   10/29/2019 at Unknown time  . Prenatal Vit-Fe Fumarate-FA (PRENATAL COMPLETE) 14-0.4 MG TABS Take 1 tablet by mouth daily. 30 each 2 10/29/2019 at Unknown time  . ibuprofen (ADVIL) 600 MG tablet Take 1 tablet (600 mg total)  by mouth every 6 (six) hours. 30 tablet 0     Review of Systems  Constitutional: Negative.   Respiratory: Negative.   Cardiovascular: Negative.   Gastrointestinal: Positive for abdominal pain. Negative for constipation, diarrhea, nausea and vomiting.  Genitourinary: Negative.   Musculoskeletal: Positive for back pain.  Neurological: Negative.   Psychiatric/Behavioral: Negative.    Physical Exam   Blood pressure 113/61, pulse 66, temperature 98.2 F (36.8 C), resp. rate 18, height 5\' 8"  (1.727 m), weight 127 kg, last menstrual period 06/26/2019, SpO2 100 %, currently breastfeeding.  Physical Exam  Nursing note and vitals reviewed. Constitutional: She is oriented to person, place, and time. She appears well-developed and well-nourished. No distress.  Cardiovascular: Normal rate and regular rhythm.  Respiratory: Effort normal and breath sounds normal. No respiratory distress. She has no wheezes.  GI: Soft. There is no abdominal tenderness. There is no rebound and no guarding.  Fundus at umbilicus - fundal height appropriate for GA. Negative CVA tenderness  Musculoskeletal:        General: No edema. Normal range of motion.  Neurological: She is alert and oriented to person, place, and time.  Psychiatric:  She has a normal mood and affect. Her behavior is normal. Thought content normal.   Dilation: 1 Effacement (%): 50 Cervical Position: Middle Exam by:: V Jencarlo Bonadonna CNM  FHR 154 by doppler   MAU Course  Procedures  MDM Orders Placed This Encounter  Procedures  . Wet prep, genital  . [place] MFM OB Transvaginal  . Urinalysis, Routine w reflex microscopic   Meds ordered this encounter  Medications  . acetaminophen (TYLENOL) tablet 1,000 mg  . Elastic Bandages & Supports (COMFORT FIT MATERNITY SUPP LG) MISC    Sig: 1 Units by Does not apply route daily.    Dispense:  1 each    Refill:  0    Order Specific Question:   Supervising Provider    Answer:   Alysia Penna, MICHAEL L [[id number]]    Wet prep and GC/C collected  Cervical examination noted patient to be 1cm/50% - cervical length ordered   Labs and [place] report reviewed: UA noted slight dehydration - encouraged patient to increase amount of water she consumes and food.  Results for orders placed or performed during the hospital encounter of 10/29/19 (from the past 24 hour(s))  Wet prep, genital     Status: Abnormal   Collection Time: 10/29/19  2:16 PM  Result Value Ref Range   Yeast Wet Prep HPF POC NONE SEEN NONE SEEN   Trich, Wet Prep NONE SEEN NONE SEEN   Clue Cells Wet Prep HPF POC NONE SEEN NONE SEEN   WBC, Wet Prep HPF POC MODERATE (A) NONE SEEN   Sperm NONE SEEN    [place] MFM OB Transvaginal  Result Date: 10/29/2019 ----------------------------------------------------------------------  OBSTETRICS REPORT                       (Signed Final 10/29/2019 03:53 pm) ---------------------------------------------------------------------- Patient Info  ID #:       [id number]                          D.O.B.:  Mar 24, 1981 (39 yrs)  Name:       Priscilla Delgado                  Visit Date: 10/29/2019 02:52 pm ---------------------------------------------------------------------- Performed By  Performed By:     Lenise Arena        Ref. Address:     Faculty                    RDMS  Attending:        Noralee Space MD        [facility]  Referred By:      Sharyon Cable CNM ---------------------------------------------------------------------- Orders   #  Description                          Code         Ordered By  1  [place] MFM OB TRANSVAGINAL               [id number]      Darrol Poke  ----------------------------------------------------------------------   #  Order #                    Accession #                 Episode #   1  [id number]                  [id number]                  [id number]   ---------------------------------------------------------------------- Indications   Pain affecting pregnancy in second trimester   O26.892   (lower back pain x1 month)   Uncertain dates (no dating ultrasound this     Z36.87   pregnancy)   [redacted] weeks gestation of pregnancy                Z3A.17  ---------------------------------------------------------------------- Vital Signs  Weight (lb): 280                               Height:        5'8"  BMI:         42.57 ---------------------------------------------------------------------- Fetal Evaluation  Num Of Fetuses:         1  Fetal Heart Rate(bpm):  148  Cardiac Activity:       Observed  Presentation:           Cephalic  Placenta:               Anterior  Amniotic Fluid  AFI FV:      Within normal limits                              Largest Pocket(cm)                              4.82  Comment:    No placental abruption or previa identified. ---------------------------------------------------------------------- OB History  Gravidity:    5         Term:   4  Living:       4 ---------------------------------------------------------------------- Gestational Age  LMP:           17w 6d        Date:  06/26/19                 EDD:   04/01/20  Best:          17w 6d     Det. By:  LMP  (06/26/19)          EDD:   04/01/20 ---------------------------------------------------------------------- Cervix Uterus Adnexa  Cervix  Length:            4.6  cm.  Normal appearance by transvaginal scan ---------------------------------------------------------------------- Impression  Patient is being evaluated at the MAU for complaints of back  pain.  Transvaginal assessment of cervical length  measurement was requested.  A limited ultrasound study was performed.  Amniotic fluid is  normal and good fetal activity seen.  On transvaginal scan,  the cervix measures 4.6 cm, which is within normal limits. ---------------------------------------------------------------------- Recommendations   -Recommend fetal anatomy scan in 2 to 3 weeks. ----------------------------------------------------------------------  Noralee Space, MD Electronically Signed Final Report   10/29/2019 03:53 pm ----------------------------------------------------------------------  CL 4.6cm - normal for GA.  Reassessment after [place] - patient reports pain is resolved after treatment in MAU with Tylenol. Educated and discussed RLP with pregnancy especially with close spaced pregnancies. Rx for maternity support belt given to patient.  Safe medications during pregnancy discussed. Encouraged patient to scheduled NOB appointment with [facility]. Message sent to office from provider for scheduling.   Discussed reasons to return to MAU. Follow up as scheduled in the office. Return to MAU as needed. Pt stable at time of discharge.   Assessment and Plan   1. Pain of round ligament during pregnancy   2. Abdominal pain during pregnancy   3. Encounter for antenatal screening for cervical length   4. Back pain with sciatica   5. [redacted] weeks gestation of pregnancy    Discharge home Make appointment to initiate prenatal care Return to MAU as needed for reasons discussed and/or emergencies  Hydration and maternity support belt  RLP precautions   Follow-up Information    [facility] for [facility]. Schedule an appointment as soon as possible for a visit.   Specialty: Obstetrics and Gynecology Why: Make appointment to be seen for prenatal care Contact information: [address], [address] [id number] [address] [phone]         Allergies as of 10/29/2019   No Known Allergies     Medication List    STOP taking these medications   ibuprofen 600 MG tablet Commonly known as: ADVIL     TAKE these medications   acetaminophen 500 MG tablet Commonly known as: TYLENOL Take 500 mg by mouth every 6 (six) hours as needed.   Comfort Fit  Maternity Supp Lg Misc 1 Units by Does not apply route daily.   Prenatal Complete 14-0.4 MG Tabs Take 1 tablet by mouth daily.      Sharyon Cable CNM 10/29/2019, 3:34 PM

## 2019-10-29 NOTE — ED Triage Notes (Addendum)
Pt presents to UC with intermittent lower back pain  And lower abdominal pain x 1 month. Pt reports she have not seen any OB/GYN for the pregnancy. Pt reports she is [redacted] weeks pregnant aprox. Pt is taking Tylenol for the pain.

## 2019-10-29 NOTE — MAU Note (Addendum)
.   Priscilla Delgado is a 39 y.o. at [redacted]w[redacted]d here in MAU reporting: that she has had back pain for a month. Denies any vaginal bleeding. States that she thinks she is only [redacted] weeks pregnant LMP: 06/26/19  Onset of complaint: month Pain score: 9 Vitals:   10/29/19 1254  BP: 113/61  Pulse: 66  Resp: 18  Temp: 98.2 F (36.8 C)  SpO2: 100%     FHT: Lab orders placed from triage: UA

## 2019-10-29 NOTE — Discharge Instructions (Signed)

## 2019-10-29 NOTE — ED Provider Notes (Signed)
[facility]    CSN: [id number] Arrival date & time: 10/29/19  1104      History   Chief Complaint Chief Complaint  Patient presents with  . Back Pain    HPI Priscilla Delgado is a 39 y.o. female.   [name] presents with complaints of low back pain as well as low abdominal pain. Back pain has been constant. Abdominal pain comes and goes. Has been taking tylenol which hasn't helped. Movement such as bending and lifting worsens her back pain. Denies any urinary symptoms. No vaginal symptoms or bleeding. She states she is pregnant, unknown LMP, states it was august or September. She has 4 living children. VBAC 02/19/2019. Has not had any prenatal care yet with this pregnancy. No specific known injury to the back.    ROS per HPI, negative if not otherwise mentioned.      Past Medical History:  Diagnosis Date  . Ulcer of the stomach and intestine     Patient Active Problem List   Diagnosis Date Noted  . VBAC (vaginal birth after Cesarean) 02/21/2019  . Advanced maternal age in multigravida 02/19/2019  . Maternal obesity affecting pregnancy, antepartum 02/19/2019  . Indication for care in labor or delivery 02/19/2019  . Supervision of low-risk pregnancy 11/27/2018  . Previous cesarean section complicating pregnancy 01/30/2015    Past Surgical History:  Procedure Laterality Date  . CESAREAN SECTION      OB History    Gravida  4   Para  4   Term  4   Preterm  0   AB  0   Living  4     SAB  0   TAB  0   Ectopic  0   Multiple  0   Live Births  4        Obstetric Comments  C/s for breech- leg was coming down         Home Medications    Prior to Admission medications   Medication Sig Start Date End Date Taking? Authorizing Provider  acetaminophen (TYLENOL) 500 MG tablet Take 500 mg by mouth every 6 (six) hours as needed.   Yes [provider]  ibuprofen (ADVIL) 600 MG tablet Take 1 tablet (600 mg total) by mouth every 6  (six) hours. 02/21/19   Aviva Signs, CNM  Prenatal Vit-Fe Fumarate-FA (PRENATAL COMPLETE) 14-0.4 MG TABS Take 1 tablet by mouth daily. 02/05/19   Armando Reichert, CNM    Family History History reviewed. No pertinent family history.  Social History Social History   Tobacco Use  . Smoking status: Never Smoker  . Smokeless tobacco: Never Used  Substance Use Topics  . Alcohol use: No  . Drug use: No     Allergies   Patient has no known allergies.   Review of Systems Review of Systems   Physical Exam Triage Vital Signs ED Triage Vitals  Enc Vitals Group     BP 10/29/19 1138 101/62     Pulse Rate 10/29/19 1138 74     Resp 10/29/19 1138 16     Temp 10/29/19 1138 98.4 F (36.9 C)     Temp Source 10/29/19 1138 Oral     SpO2 10/29/19 1138 100 %     Weight --      Height --      Head Circumference --      Peak Flow --      Pain Score 10/29/19 1136 5  Pain Loc --      Pain Edu? --      Excl. in [place]? --    No data found.  Updated Vital Signs BP 101/62 (BP Location: Right Arm)   Pulse 74   Temp 98.4 F (36.9 C) (Oral)   Resp 16   SpO2 100%      Physical Exam Constitutional:      General: She is not in acute distress.    Appearance: She is well-developed. She is obese.  Cardiovascular:     Rate and Rhythm: Normal rate.  Pulmonary:     Effort: Pulmonary effort is normal.  Abdominal:     Tenderness: There is abdominal tenderness in the suprapubic area.     Comments: Generalized low abdomen with tenderness   Musculoskeletal:     Lumbar back: Tenderness present.     Comments: Low back pain on palpation without palpable step off or deformity; no pain with hip flexion or straight leg raise; ambulatory up onto cart without apparent difficulty   Skin:    General: Skin is warm and dry.  Neurological:     Mental Status: She is alert and oriented to person, place, and time.      UC Treatments / Results  Labs (all labs ordered are listed, but only  abnormal results are displayed) Labs Reviewed  POCT PREGNANCY, URINE - Abnormal; Notable for the following components:      Result Value   Preg Test, Ur POSITIVE (*)    All other components within normal limits  POC URINE PREG, ED - Abnormal; Notable for the following components:   Preg Test, Ur POSITIVE (*)    All other components within normal limits    EKG   Radiology No results found.  Procedures Procedures (including critical care time)  Medications Ordered in UC Medications - No data to display  Initial Impression / Assessment and Plan / UC Course  I have reviewed the triage vital signs and the nursing notes.  Pertinent labs & imaging results that were available during my care of the patient were reviewed by me and considered in my medical decision making (see chart for details).     Pregnant with low abdominal pain on palpation and has been intermittent for patient without known trigger. No vaginal bleeding. No recent prenatal care or dating/location ultrasound. Low back pain appears likely to be musculoskeletal but with abdominal pain do feel they have potential for relationship. Recommend further evaluation at the [facility] at this time. Patient safe for self transport and agreeable at this time.  Final Clinical Impressions(s) / UC Diagnoses   Final diagnoses:  Lower abdominal pain  Positive pregnancy test  Acute midline low back pain without sciatica     Discharge Instructions     Please go now to the [facility] for further evaluation of your abdominal pain.    ED Prescriptions    None     PDMP not reviewed this encounter.   Zigmund Gottron, NP 10/29/19 1216

## 2019-10-30 DIAGNOSIS — O339 Maternal care for disproportion, unspecified: Secondary | ICD-10-CM | POA: Diagnosis not present

## 2019-10-30 LAB — GC/CHLAMYDIA PROBE AMP (~~LOC~~) NOT AT ARMC
Chlamydia: NEGATIVE
Comment: NEGATIVE
Comment: NORMAL
Neisseria Gonorrhea: NEGATIVE

## 2019-11-07 ENCOUNTER — Telehealth: Payer: Self-pay | Admitting: Medical

## 2019-11-07 ENCOUNTER — Other Ambulatory Visit: Payer: Self-pay

## 2019-11-07 ENCOUNTER — Inpatient Hospital Stay (HOSPITAL_COMMUNITY)
Admission: AD | Admit: 2019-11-07 | Discharge: 2019-11-07 | Disposition: A | Discharge status: Home / Self Care | Payer: Medicaid Other | Attending: Obstetrics and Gynecology | Admitting: Obstetrics and Gynecology

## 2019-11-07 ENCOUNTER — Encounter (HOSPITAL_COMMUNITY): Payer: Self-pay | Admitting: Obstetrics and Gynecology

## 2019-11-07 ENCOUNTER — Ambulatory Visit (INDEPENDENT_AMBULATORY_CARE_PROVIDER_SITE_OTHER): Payer: Medicaid Other | Admitting: *Deleted

## 2019-11-07 DIAGNOSIS — O368120 Decreased fetal movements, second trimester, not applicable or unspecified: Secondary | ICD-10-CM

## 2019-11-07 DIAGNOSIS — O09529 Supervision of elderly multigravida, unspecified trimester: Secondary | ICD-10-CM

## 2019-11-07 DIAGNOSIS — O0990 Supervision of high risk pregnancy, unspecified, unspecified trimester: Secondary | ICD-10-CM | POA: Insufficient documentation

## 2019-11-07 DIAGNOSIS — Z3A19 19 weeks gestation of pregnancy: Secondary | ICD-10-CM | POA: Insufficient documentation

## 2019-11-07 DIAGNOSIS — O99210 Obesity complicating pregnancy, unspecified trimester: Secondary | ICD-10-CM

## 2019-11-07 DIAGNOSIS — O09522 Supervision of elderly multigravida, second trimester: Secondary | ICD-10-CM | POA: Insufficient documentation

## 2019-11-07 DIAGNOSIS — Z3492 Encounter for supervision of normal pregnancy, unspecified, second trimester: Secondary | ICD-10-CM

## 2019-11-07 DIAGNOSIS — Z789 Other specified health status: Secondary | ICD-10-CM | POA: Insufficient documentation

## 2019-11-07 DIAGNOSIS — O34219 Maternal care for unspecified type scar from previous cesarean delivery: Secondary | ICD-10-CM | POA: Insufficient documentation

## 2019-11-07 LAB — URINALYSIS, ROUTINE W REFLEX MICROSCOPIC
Bilirubin Urine: NEGATIVE
Glucose, UA: NEGATIVE mg/dL
Hgb urine dipstick: NEGATIVE
Ketones, ur: 5 mg/dL — AB
Leukocytes,Ua: NEGATIVE
Nitrite: NEGATIVE
Protein, ur: NEGATIVE mg/dL
Specific Gravity, Urine: 1.014 (ref 1.005–1.030)
pH: 6 (ref 5.0–8.0)

## 2019-11-07 NOTE — Progress Notes (Signed)
I connected with  [name] on 11/07/19 at  9:30 AM EST by telephone and verified that I am speaking with the correct person using two identifiers.   I discussed the limitations, risks, security and privacy concerns of performing an evaluation and management service by telephone and the availability of in person appointments. I also discussed with the patient that there may be a patient responsible charge related to this service. The patient expressed understanding and agreed to proceed. Explained I am completing her New OB Intake today. We discussed Her EDD and that it is based on  sure LMP and early [place] that matched . She now states she isn't sure if LMP was September or August.  She also reports she is concerned because she is not feeling the baby move.  She states she has never felt the baby move since she  Has been pregnant. She denies any bleeding. Her ob visit is not until 11/11/18. I discussed with Dr.Ervin patient c/o of not feeling baby move ever since pregnancy, that she had ED visit and [place] 1/4 which confirmed FHR.; that she is G5P4 , 19 weeks and denies bleeding. Ordered to instruct patient to go to [facility] for evaluation. I explained to patient we will stop Intake and reschedule ; and she should go to [facility] for evaluation asap today. She voices understanding.  Jeannemarie Sawaya,RN 11/07/2019  9:24 AM

## 2019-11-07 NOTE — MAU Provider Note (Signed)
Chief Complaint: Decreased Fetal Movement   First Provider Initiated Contact with Patient 11/07/19 1125     SUBJECTIVE HPI: Priscilla Delgado is a 39 y.o. [id number] at [redacted]w[redacted]d who presents to Maternity Admissions reporting decreased fetal movement.  Was on the phone with the nurse for her new ob intake & reported decreased movement. Was instructed to come to MAU for evaluation. Patient reports that she has not felt any movement yet with this pregnancy. Denies abdominal pain, vaginal bleeding, or LOF.   Past Medical History:  Diagnosis Date  . Ulcer of the stomach and intestine    OB History  Gravida Para Term Preterm AB Living  5 4 4  0 0 4  SAB TAB Ectopic Multiple Live Births  0 0 0 0 4    # Outcome Date GA Lbr Len/2nd Weight Sex Delivery Anes PTL Lv  5 Current           4 Term 02/19/19 [redacted]w[redacted]d 14:01 / 00:06 3435 g M VBAC None  LIV  3 Term 03/14/15 [redacted]w[redacted]d 04:09 / 00:15 2975 g M VBAC None  LIV  2 Term 07/03/10 [redacted]w[redacted]d  2500 g F Vag-Spont   LIV  1 Term 10/19/08 [redacted]w[redacted]d  3000 g M CS-Unspec Spinal  LIV    Obstetric Comments  C/s for breech- leg was coming down   Past Surgical History:  Procedure Laterality Date  . CESAREAN SECTION     Social History   Socioeconomic History  . Marital status: Married    Spouse name: Not on file  . Number of children: Not on file  . Years of education: Not on file  . Highest education level: Not on file  Occupational History  . Not on file  Tobacco Use  . Smoking status: Never Smoker  . Smokeless tobacco: Never Used  Substance and Sexual Activity  . Alcohol use: No  . Drug use: No  . Sexual activity: Not Currently    Birth control/protection: None  Other Topics Concern  . Not on file  Social History Narrative  . Not on file   Social Determinants of Health   Financial Resource Strain:   . Difficulty of Paying Living Expenses: Not on file  Food Insecurity: Food Insecurity Present  . Worried About [redacted]w[redacted]d in the Last Year: Sometimes  true  . Ran Out of Food in the Last Year: Sometimes true  Transportation Needs: No Transportation Needs  . Lack of Transportation (Medical): No  . Lack of Transportation (Non-Medical): No  Physical Activity:   . Days of Exercise per Week: Not on file  . Minutes of Exercise per Session: Not on file  Stress:   . Feeling of Stress : Not on file  Social Connections:   . Frequency of Communication with Friends and Family: Not on file  . Frequency of Social Gatherings with Friends and Family: Not on file  . Attends Religious Services: Not on file  . Active Member of Clubs or Organizations: Not on file  . Attends Programme researcher, broadcasting/film/video Meetings: Not on file  . Marital Status: Not on file  Intimate Partner Violence:   . Fear of Current or Ex-Partner: Not on file  . Emotionally Abused: Not on file  . Physically Abused: Not on file  . Sexually Abused: Not on file   History reviewed. No pertinent family history. No current facility-administered medications on file prior to encounter.   Current Outpatient Medications on File Prior to Encounter  Medication Sig Dispense Refill  .  acetaminophen (TYLENOL) 500 MG tablet Take 500 mg by mouth every 6 (six) hours as needed.    . Elastic Bandages & Supports (COMFORT FIT MATERNITY SUPP LG) MISC 1 Units by Does not apply route daily. 1 each 0  . Prenatal Vit-Fe Fumarate-FA (PRENATAL COMPLETE) 14-0.4 MG TABS Take 1 tablet by mouth daily. 30 each 2  . Prenatal Vit-Fe Fumarate-FA (PRENATAL VITAMINS PO) Take 1 tablet by mouth daily.     No Known Allergies  I have reviewed patient's Past Medical Hx, Surgical Hx, Family Hx, Social Hx, medications and allergies.   Review of Systems  Constitutional: Negative.   Gastrointestinal: Negative.   Genitourinary: Negative.     OBJECTIVE Patient Vitals for the past 24 hrs:  BP Temp Temp src Pulse Resp SpO2  11/07/19 1114 (!) 112/57 98 F (36.7 C) Oral 78 16 100 %   Constitutional: Well-developed,  well-nourished female in no acute distress.  Cardiovascular: normal rate & rhythm, no murmur Respiratory: normal rate and effort. Lung sounds clear throughout GI: Abd soft, non-tender, Pos BS x 4. No guarding or rebound tenderness MS: Extremities nontender, no edema, normal ROM Neurologic: Alert and oriented x 4.      LAB RESULTS Results for orders placed or performed during the hospital encounter of 11/07/19 (from the past 24 hour(s))  Urinalysis, Routine w reflex microscopic     Status: Abnormal   Collection Time: 11/07/19 11:02 AM  Result Value Ref Range   Color, Urine YELLOW YELLOW   APPearance HAZY (A) CLEAR   Specific Gravity, Urine 1.014 1.005 - 1.030   pH 6.0 5.0 - 8.0   Glucose, UA NEGATIVE NEGATIVE mg/dL   Hgb urine dipstick NEGATIVE NEGATIVE   Bilirubin Urine NEGATIVE NEGATIVE   Ketones, ur 5 (A) NEGATIVE mg/dL   Protein, ur NEGATIVE NEGATIVE mg/dL   Nitrite NEGATIVE NEGATIVE   Leukocytes,Ua NEGATIVE NEGATIVE    IMAGING No results found.  MAU COURSE Orders Placed This Encounter  Procedures  . Urinalysis, Routine w reflex microscopic  . Discharge patient   No orders of the defined types were placed in this encounter.   MDM FHT present via doppler Informal BSUS performed for patient reassurance, see note below.   Pt informed that the ultrasound is considered a limited OB ultrasound and is not intended to be a complete ultrasound exam.  Patient also informed that the ultrasound is not being completed with the intent of assessing for fetal or placental anomalies or any pelvic abnormalities.  Explained that the purpose of today's ultrasound is to assess for  patient reassurance.  Patient acknowledges the purpose of the exam and the limitations of the study.   Live IUP. Active movement seen. Pt reassured  Discussed expectations of movement throughout pregnancy. Perception of movement may be altered at this time due to anterior placenta.    ASSESSMENT 1. Decreased  fetal movements in second trimester, single or unspecified fetus   2. Fetal heart tones present, second trimester   3. [redacted] weeks gestation of pregnancy     PLAN Discharge home in stable condition. Discussed reasons to return to MAU Start prenatal care  Allergies as of 11/07/2019   No Known Allergies     Medication List    TAKE these medications   acetaminophen 500 MG tablet Commonly known as: TYLENOL Take 500 mg by mouth every 6 (six) hours as needed.   Comfort Fit Maternity Supp Lg Misc 1 Units by Does not apply route daily.   PRENATAL VITAMINS  PO Take 1 tablet by mouth daily.   Prenatal Complete 14-0.4 MG Tabs Take 1 tablet by mouth daily.        Jorje Guild, NP 11/07/2019  11:52 AM

## 2019-11-07 NOTE — MAU Note (Signed)
Priscilla Delgado is a 39 y.o. at [redacted]w[redacted]d here in MAU reporting: was sent over because she has not felt any fetal movement yet. No pain or bleeding,  Onset of complaint: ongoing  Pain score: 0/10  Vitals:   11/07/19 1114  BP: (!) 112/57  Pulse: 78  Resp: 16  Temp: 98 F (36.7 C)  SpO2: 100%     FHT: 150  Lab orders placed from triage: UA

## 2019-11-07 NOTE — Discharge Instructions (Signed)

## 2019-11-07 NOTE — Telephone Encounter (Signed)
     Return for reschedule new ob intake  I was not able to complete visit today- I had to send her to the hospital for evaluation. I am not sure we should mover her new ob visit- she is 19 week, AMA, Hx, C/S, and language barrier.  Per nurse Bonita Quin, appts rescheduled

## 2019-11-12 ENCOUNTER — Telehealth: Payer: Self-pay | Admitting: Family Medicine

## 2019-11-12 ENCOUNTER — Encounter: Payer: Medicaid Other | Admitting: Family Medicine

## 2019-11-12 NOTE — Telephone Encounter (Signed)
Called the patient with the in office interpreter to inform of the missed appointment. The patient agreed to the upcoming appointment date and time.

## 2019-11-22 ENCOUNTER — Encounter: Payer: Medicaid Other | Admitting: Obstetrics and Gynecology

## 2019-11-26 ENCOUNTER — Encounter: Payer: Self-pay | Admitting: Obstetrics and Gynecology

## 2019-11-26 ENCOUNTER — Ambulatory Visit (INDEPENDENT_AMBULATORY_CARE_PROVIDER_SITE_OTHER): Payer: Medicaid Other | Admitting: Obstetrics and Gynecology

## 2019-11-26 ENCOUNTER — Other Ambulatory Visit: Payer: Self-pay

## 2019-11-26 VITALS — BP 119/66 | HR 80 | Wt 279.0 lb

## 2019-11-26 DIAGNOSIS — O09522 Supervision of elderly multigravida, second trimester: Secondary | ICD-10-CM

## 2019-11-26 DIAGNOSIS — Z3A21 21 weeks gestation of pregnancy: Secondary | ICD-10-CM | POA: Diagnosis not present

## 2019-11-26 DIAGNOSIS — O09892 Supervision of other high risk pregnancies, second trimester: Secondary | ICD-10-CM | POA: Diagnosis not present

## 2019-11-26 DIAGNOSIS — O09899 Supervision of other high risk pregnancies, unspecified trimester: Secondary | ICD-10-CM

## 2019-11-26 DIAGNOSIS — O99210 Obesity complicating pregnancy, unspecified trimester: Secondary | ICD-10-CM

## 2019-11-26 DIAGNOSIS — Z6841 Body Mass Index (BMI) 40.0 and over, adult: Secondary | ICD-10-CM | POA: Insufficient documentation

## 2019-11-26 DIAGNOSIS — Z87898 Personal history of other specified conditions: Secondary | ICD-10-CM

## 2019-11-26 DIAGNOSIS — O34219 Maternal care for unspecified type scar from previous cesarean delivery: Secondary | ICD-10-CM

## 2019-11-26 DIAGNOSIS — O09529 Supervision of elderly multigravida, unspecified trimester: Secondary | ICD-10-CM

## 2019-11-26 DIAGNOSIS — O0930 Supervision of pregnancy with insufficient antenatal care, unspecified trimester: Secondary | ICD-10-CM | POA: Insufficient documentation

## 2019-11-26 DIAGNOSIS — O0992 Supervision of high risk pregnancy, unspecified, second trimester: Secondary | ICD-10-CM | POA: Diagnosis not present

## 2019-11-26 DIAGNOSIS — Z3482 Encounter for supervision of other normal pregnancy, second trimester: Secondary | ICD-10-CM | POA: Diagnosis not present

## 2019-11-26 DIAGNOSIS — Z98891 History of uterine scar from previous surgery: Secondary | ICD-10-CM

## 2019-11-26 DIAGNOSIS — O99212 Obesity complicating pregnancy, second trimester: Secondary | ICD-10-CM

## 2019-11-26 DIAGNOSIS — O0990 Supervision of high risk pregnancy, unspecified, unspecified trimester: Secondary | ICD-10-CM

## 2019-11-26 DIAGNOSIS — Z789 Other specified health status: Secondary | ICD-10-CM

## 2019-11-26 DIAGNOSIS — O0932 Supervision of pregnancy with insufficient antenatal care, second trimester: Secondary | ICD-10-CM | POA: Diagnosis not present

## 2019-11-26 HISTORY — DX: Body Mass Index (BMI) 40.0 and over, adult: Z684

## 2019-11-26 MED ORDER — ASPIRIN EC 81 MG PO TBEC: 81.0000 mg | DELAYED_RELEASE_TABLET | Freq: Every day | ORAL | 2 refills | Status: DC

## 2019-11-26 MED ORDER — BLOOD PRESSURE KIT DEVI: 1.0000 | Freq: Every day | 0 refills | Status: DC

## 2019-11-26 NOTE — Progress Notes (Signed)
New OB Note  11/26/2019   Clinic: [facility]  Chief Complaint: NOB  Transfer of Care Patient: no  History of Present Illness: Priscilla Delgado is a 39 y.o. [id number] @ 21/6 weeks (EDC 6/8 [tentative], based on Patient's last menstrual period was 06/26/2019.).  Preg complicated by has History of VBAC; Advanced maternal age in multigravida; Maternal obesity affecting pregnancy, antepartum; Supervision of high risk pregnancy, antepartum; Language barrier; BMI 40.0-44.9, adult ([place]); History of ulcer disease; and Short interval between pregnancies affecting pregnancy, antepartum on their problem list.   Any events prior to today's visit: normal cx length on 1/4 MAU visit for back pain Her periods were: qmonth, regular She was using no method when she conceived.  She has Negative signs or symptoms of nausea/vomiting of pregnancy. She has Negative signs or symptoms of miscarriage or preterm labor On any medications around the time she conceived/early pregnancy: No   ROS: A 12-point review of systems was performed and negative, except as stated in the above HPI.  OBGYN History: As per HPI. OB History  Gravida Para Term Preterm AB Living  5 4 4  0 0 4  SAB TAB Ectopic Multiple Live Births  0 0 0 0 4    # Outcome Date GA Lbr Len/2nd Weight Sex Delivery Anes PTL Lv  5 Current           4 Term 02/19/19 [redacted]w[redacted]d 14:01 / 00:06 7 lb 9.2 oz (3.435 kg) M VBAC None  LIV  3 Term 03/14/15 [redacted]w[redacted]d 04:09 / 00:15 6 lb 8.9 oz (2.975 kg) M VBAC None  LIV  2 Term 07/03/10 [redacted]w[redacted]d  5 lb 8.2 oz (2.5 kg) F Vag-Spont   LIV  1 Term 10/19/08 [redacted]w[redacted]d  6 lb 9.8 oz (3 kg) M CS-Unspec Spinal  LIV    Obstetric Comments  C/s for breech- leg was coming down    Any issues with any prior pregnancies: no Prior children are healthy, doing well, and without any problems or issues: yes History of pap smears: Yes. Last pap smear 2020 and results were negative   Past Medical History: Past Medical History:   Diagnosis Date  . Ulcer of the stomach and intestine     Past Surgical History: Past Surgical History:  Procedure Laterality Date  . CESAREAN SECTION      Family History:  No family history on file.  Social History:  Social History   Socioeconomic History  . Marital status: Married    Spouse name: Not on file  . Number of children: Not on file  . Years of education: Not on file  . Highest education level: Not on file  Occupational History  . Not on file  Tobacco Use  . Smoking status: Never Smoker  . Smokeless tobacco: Never Used  Substance and Sexual Activity  . Alcohol use: No  . Drug use: No  . Sexual activity: Not Currently    Birth control/protection: None  Other Topics Concern  . Not on file  Social History Narrative  . Not on file   Social Determinants of Health   Financial Resource Strain:   . Difficulty of Paying Living Expenses: Not on file  Food Insecurity: Food Insecurity Present  . Worried About Charity fundraiser in the Last Year: Sometimes true  . Ran Out of Food in the Last Year: Sometimes true  Transportation Needs: No Transportation Needs  . Lack of Transportation (Medical): No  . Lack of Transportation (Non-Medical): No  Physical Activity:   . Days of Exercise per Week: Not on file  . Minutes of Exercise per Session: Not on file  Stress:   . Feeling of Stress : Not on file  Social Connections:   . Frequency of Communication with Friends and Family: Not on file  . Frequency of Social Gatherings with Friends and Family: Not on file  . Attends Religious Services: Not on file  . Active Member of Clubs or Organizations: Not on file  . Attends Banker Meetings: Not on file  . Marital Status: Not on file  Intimate Partner Violence:   . Fear of Current or Ex-Partner: Not on file  . Emotionally Abused: Not on file  . Physically Abused: Not on file  . Sexually Abused: Not on file    Allergy: No Known Allergies  Health  Maintenance:  Mammogram Up to Date: not applicable  Current Outpatient Medications: PNV  Physical Exam:   BP 119/66   Pulse 80   Wt 279 lb (126.6 kg)   LMP 06/26/2019   BMI 42.42 kg/m  Body mass index is 42.42 kg/m. Contractions: Not present Vag. Bleeding: None. Fundal height: 20 FHTs: 150s  General appearance: Well nourished, well developed female in no acute distress.  Neck:  Supple, normal appearance, and no thyromegaly  Cardiovascular: S1, S2 normal, no murmur, rub or gallop, regular rate and rhythm Respiratory:  Clear to auscultation bilateral. Normal respiratory effort Abdomen: positive bowel sounds and no masses, hernias; diffusely non tender to palpation, non distended Breasts: pt denies any breast s/s. Neuro/Psych:  Normal mood and affect.  Skin:  Warm and dry.   Laboratory: reviewed  Imaging:  reviewed  Assessment: pt doing well  Plan: 1. Supervision of high risk pregnancy, antepartum Routine care Confirm dates after anatomy u/s Declines carrier screening - Genetic Screening - Obstetric Panel, Including HIV - [place] MFM OB DETAIL +14 WK; Future - Culture, OB Urine - Hemoglobin A1c - AFP, Serum, Open Spina Bifida - Comprehensive metabolic panel - TSH - Protein / creatinine ratio, urine  2. Antepartum multigravida of advanced maternal age F/u anatomy and panorama  3. Maternal obesity affecting pregnancy, antepartum  4. Language barrier Declined interpreter  5. BMI 40.0-44.9, adult (HCC)  6. Short interval between pregnancies affecting pregnancy, antepartum  7. History of VBAC Sign consent later in pregnancy   Problem list reviewed and updated.  Follow up in 3 weeks virtual visit  The nature of [facility] with multiple MDs and other Advanced Practice Providers was explained to patient; also emphasized that residents, students are part of our team.  >50% of 20 min visit spent on counseling and  coordination of care.     Priscilla Copa MD Attending Center for [facility])

## 2019-11-27 LAB — COMPREHENSIVE METABOLIC PANEL
ALT: 6 IU/L (ref 0–32)
AST: 14 IU/L (ref 0–40)
Albumin/Globulin Ratio: 1.5 (ref 1.2–2.2)
Albumin: 3.6 g/dL — ABNORMAL LOW (ref 3.8–4.8)
Alkaline Phosphatase: 76 IU/L (ref 39–117)
BUN/Creatinine Ratio: 10 (ref 9–23)
BUN: 5 mg/dL — ABNORMAL LOW (ref 6–20)
Bilirubin Total: 0.2 mg/dL (ref 0.0–1.2)
CO2: 18 mmol/L — ABNORMAL LOW (ref 20–29)
Calcium: 8.5 mg/dL — ABNORMAL LOW (ref 8.7–10.2)
Chloride: 104 mmol/L (ref 96–106)
Creatinine, Ser: 0.51 mg/dL — ABNORMAL LOW (ref 0.57–1.00)
GFR calc Af Amer: 142 mL/min/{1.73_m2} (ref >59)
GFR calc non Af Amer: 123 mL/min/{1.73_m2} (ref >59)
Globulin, Total: 2.4 g/dL (ref 1.5–4.5)
Glucose: 75 mg/dL (ref 65–99)
Potassium: 3.8 mmol/L (ref 3.5–5.2)
Sodium: 135 mmol/L (ref 134–144)
Total Protein: 6 g/dL (ref 6.0–8.5)

## 2019-11-27 LAB — OBSTETRIC PANEL, INCLUDING HIV
Antibody Screen: NEGATIVE
Basophils Absolute: 0 10*3/uL (ref 0.0–0.2)
Basos: 1 %
EOS (ABSOLUTE): 0.1 10*3/uL (ref 0.0–0.4)
Eos: 1 %
HIV Screen 4th Generation wRfx: NONREACTIVE
Hematocrit: 36 % (ref 34.0–46.6)
Hemoglobin: 12.1 g/dL (ref 11.1–15.9)
Hepatitis B Surface Ag: NEGATIVE
Immature Grans (Abs): 0 10*3/uL (ref 0.0–0.1)
Immature Granulocytes: 0 %
Lymphocytes Absolute: 1.4 10*3/uL (ref 0.7–3.1)
Lymphs: 22 %
MCH: 30.3 pg (ref 26.6–33.0)
MCHC: 33.6 g/dL (ref 31.5–35.7)
MCV: 90 fL (ref 79–97)
Monocytes Absolute: 0.4 10*3/uL (ref 0.1–0.9)
Monocytes: 6 %
Neutrophils Absolute: 4.3 10*3/uL (ref 1.4–7.0)
Neutrophils: 70 %
Platelets: 172 10*3/uL (ref 150–450)
RBC: 3.99 x10E6/uL (ref 3.77–5.28)
RDW: 12.5 % (ref 11.7–15.4)
RPR Ser Ql: NONREACTIVE
Rh Factor: POSITIVE
Rubella Antibodies, IGG: 21.5 index (ref >0.99)
WBC: 6.2 10*3/uL (ref 3.4–10.8)

## 2019-11-27 LAB — PROTEIN / CREATININE RATIO, URINE
Creatinine, Urine: 128.5 mg/dL
Protein, Ur: 9.5 mg/dL
Protein/Creat Ratio: 74 mg/g creat (ref 0–200)

## 2019-11-27 LAB — AFP, SERUM, OPEN SPINA BIFIDA
AFP MoM: 0.95
AFP Value: 51.6 ng/mL
Gest. Age on Collection Date: 21.6 weeks
Maternal Age At EDD: 37.4 yr
OSBR Risk 1 IN: 10000
Test Results:: NEGATIVE
Weight: 279 [lb_av]

## 2019-11-27 LAB — HEMOGLOBIN A1C
Est. average glucose Bld gHb Est-mCnc: 94 mg/dL
Hgb A1c MFr Bld: 4.9 % (ref 4.8–5.6)

## 2019-11-27 LAB — TSH: TSH: 5.15 u[IU]/mL — ABNORMAL HIGH (ref 0.450–4.500)

## 2019-11-28 ENCOUNTER — Other Ambulatory Visit: Payer: Self-pay

## 2019-11-28 ENCOUNTER — Encounter: Payer: Self-pay | Admitting: Obstetrics and Gynecology

## 2019-11-28 DIAGNOSIS — E038 Other specified hypothyroidism: Secondary | ICD-10-CM

## 2019-11-28 DIAGNOSIS — E039 Hypothyroidism, unspecified: Secondary | ICD-10-CM | POA: Insufficient documentation

## 2019-11-28 DIAGNOSIS — R7989 Other specified abnormal findings of blood chemistry: Secondary | ICD-10-CM

## 2019-11-28 HISTORY — DX: Other specified hypothyroidism: E03.8

## 2019-11-28 LAB — URINE CULTURE, OB REFLEX

## 2019-11-28 LAB — CULTURE, OB URINE

## 2019-11-28 NOTE — Progress Notes (Signed)
Per Dr. Vergie Living pt needs to have added a free T4 d/t elevated TSH.  Addison Naegeli, RN

## 2019-11-29 ENCOUNTER — Encounter: Payer: Self-pay | Admitting: *Deleted

## 2019-12-04 ENCOUNTER — Encounter: Payer: Self-pay | Admitting: General Practice

## 2019-12-05 ENCOUNTER — Inpatient Hospital Stay (HOSPITAL_COMMUNITY): Admission: RE | Admit: 2019-12-05 | Payer: Medicaid Other | Source: Ambulatory Visit

## 2019-12-10 ENCOUNTER — Telehealth: Payer: Self-pay | Admitting: Obstetrics and Gynecology

## 2019-12-10 NOTE — Telephone Encounter (Signed)
Received a call from Crystal with Care Management that she had spoken with this patient today.

## 2019-12-14 ENCOUNTER — Telehealth: Payer: Self-pay | Admitting: *Deleted

## 2019-12-14 NOTE — Telephone Encounter (Signed)
Priscilla Delgado called front desk asking for results and call transferred to nurse. She confirmed her full name and DOB and requested results of panorama sex of baby. She confirmed she speaks [place] but speaks some [place]. Per policy due to patient not on Mychart and langauage barrier informed her natera panorama indicates she is having a girl. She expressed happiness and thanked me.  Stephanee Barcomb,RN

## 2019-12-21 ENCOUNTER — Ambulatory Visit: Payer: Medicaid Other | Admitting: Obstetrics and Gynecology

## 2019-12-21 ENCOUNTER — Encounter: Payer: Self-pay | Admitting: Obstetrics and Gynecology

## 2019-12-21 ENCOUNTER — Other Ambulatory Visit: Payer: Self-pay

## 2019-12-21 ENCOUNTER — Telehealth: Payer: Self-pay | Admitting: Obstetrics and Gynecology

## 2019-12-21 DIAGNOSIS — Z5329 Procedure and treatment not carried out because of patient's decision for other reasons: Secondary | ICD-10-CM

## 2019-12-21 DIAGNOSIS — Z91199 Patient's noncompliance with other medical treatment and regimen due to unspecified reason: Secondary | ICD-10-CM

## 2019-12-21 NOTE — Progress Notes (Signed)
Patient did not keep her OB appointment for 12/21/2019.  Cornelia Copa MD Attending Center for [facility])

## 2019-12-21 NOTE — Progress Notes (Signed)
LVM @1013am  with interpreter to be available by phone will call back in 5-10 mins for appointment. 1023am Lvm with same interperter that we will need to reschedule her appointment and to give the office a call back.

## 2019-12-21 NOTE — Telephone Encounter (Signed)
Attempted to contact patient with [place] interpreter #ID [phone] to get her rescheduled for her missed ob appointment. No answer and unable to leave a message due to the voicemail not being set up. No show letter mailed.

## 2019-12-26 ENCOUNTER — Encounter (HOSPITAL_COMMUNITY): Payer: Self-pay

## 2019-12-26 ENCOUNTER — Other Ambulatory Visit: Payer: Self-pay

## 2019-12-26 ENCOUNTER — Ambulatory Visit (HOSPITAL_COMMUNITY): Payer: Medicaid Other | Admitting: *Deleted

## 2019-12-26 ENCOUNTER — Other Ambulatory Visit (HOSPITAL_COMMUNITY): Payer: Self-pay | Admitting: *Deleted

## 2019-12-26 ENCOUNTER — Ambulatory Visit (HOSPITAL_COMMUNITY)
Admission: RE | Admit: 2019-12-26 | Discharge: 2019-12-26 | Disposition: A | Discharge status: Home / Self Care | Payer: Medicaid Other | Source: Ambulatory Visit | Attending: Obstetrics and Gynecology | Admitting: Obstetrics and Gynecology

## 2019-12-26 ENCOUNTER — Encounter: Payer: Self-pay | Admitting: Obstetrics & Gynecology

## 2019-12-26 DIAGNOSIS — O09522 Supervision of elderly multigravida, second trimester: Secondary | ICD-10-CM | POA: Diagnosis not present

## 2019-12-26 DIAGNOSIS — Z789 Other specified health status: Secondary | ICD-10-CM | POA: Insufficient documentation

## 2019-12-26 DIAGNOSIS — O0990 Supervision of high risk pregnancy, unspecified, unspecified trimester: Secondary | ICD-10-CM | POA: Insufficient documentation

## 2019-12-26 DIAGNOSIS — O99212 Obesity complicating pregnancy, second trimester: Secondary | ICD-10-CM | POA: Diagnosis not present

## 2019-12-26 DIAGNOSIS — Z3A26 26 weeks gestation of pregnancy: Secondary | ICD-10-CM

## 2019-12-26 DIAGNOSIS — Z362 Encounter for other antenatal screening follow-up: Secondary | ICD-10-CM

## 2019-12-31 ENCOUNTER — Encounter: Payer: Medicaid Other | Admitting: Family Medicine

## 2020-01-07 ENCOUNTER — Encounter: Payer: Self-pay | Admitting: Family Medicine

## 2020-01-07 ENCOUNTER — Telehealth: Payer: Self-pay | Admitting: Family Medicine

## 2020-01-07 ENCOUNTER — Ambulatory Visit (INDEPENDENT_AMBULATORY_CARE_PROVIDER_SITE_OTHER): Payer: Medicaid Other | Admitting: Family Medicine

## 2020-01-07 ENCOUNTER — Other Ambulatory Visit: Payer: Self-pay

## 2020-01-07 VITALS — BP 102/68 | HR 76 | Wt 286.9 lb

## 2020-01-07 DIAGNOSIS — O0990 Supervision of high risk pregnancy, unspecified, unspecified trimester: Secondary | ICD-10-CM | POA: Diagnosis not present

## 2020-01-07 DIAGNOSIS — O0932 Supervision of pregnancy with insufficient antenatal care, second trimester: Secondary | ICD-10-CM | POA: Diagnosis not present

## 2020-01-07 DIAGNOSIS — O99212 Obesity complicating pregnancy, second trimester: Secondary | ICD-10-CM

## 2020-01-07 DIAGNOSIS — O09899 Supervision of other high risk pregnancies, unspecified trimester: Secondary | ICD-10-CM

## 2020-01-07 DIAGNOSIS — Z23 Encounter for immunization: Secondary | ICD-10-CM | POA: Diagnosis not present

## 2020-01-07 DIAGNOSIS — O09522 Supervision of elderly multigravida, second trimester: Secondary | ICD-10-CM | POA: Diagnosis not present

## 2020-01-07 DIAGNOSIS — Z3A27 27 weeks gestation of pregnancy: Secondary | ICD-10-CM

## 2020-01-07 DIAGNOSIS — O0930 Supervision of pregnancy with insufficient antenatal care, unspecified trimester: Secondary | ICD-10-CM

## 2020-01-07 DIAGNOSIS — Z98891 History of uterine scar from previous surgery: Secondary | ICD-10-CM

## 2020-01-07 DIAGNOSIS — Z789 Other specified health status: Secondary | ICD-10-CM

## 2020-01-07 DIAGNOSIS — R7989 Other specified abnormal findings of blood chemistry: Secondary | ICD-10-CM | POA: Diagnosis not present

## 2020-01-07 DIAGNOSIS — O99210 Obesity complicating pregnancy, unspecified trimester: Secondary | ICD-10-CM

## 2020-01-07 NOTE — Telephone Encounter (Signed)
Called the patient to confirm the upcoming visit for a 2 hour lab for tomorrow. Unable to contact as the voicemail is not setup yet and no access to mychart.

## 2020-01-07 NOTE — Progress Notes (Signed)
   Subjective:  Priscilla Delgado is a 39 y.o. [id number] at [redacted]w[redacted]d being seen today for ongoing prenatal care.  She is currently monitored for the following issues for this high-risk pregnancy and has History of VBAC; Advanced maternal age in multigravida; Maternal obesity affecting pregnancy, antepartum; Supervision of high risk pregnancy, antepartum; Language barrier; BMI 40.0-44.9, adult (HCC); History of ulcer disease; Short interval between pregnancies affecting pregnancy, antepartum; Late prenatal care; and Elevated TSH on their problem list.  Patient reports no complaints.  Contractions: Not present. Vag. Bleeding: None.  Movement: Present. Denies leaking of fluid.   The following portions of the patient's history were reviewed and updated as appropriate: allergies, current medications, past family history, past medical history, past social history, past surgical history and problem list. Problem list updated.  Objective:   Vitals:   01/07/20 1147  BP: 102/68  Pulse: 76  Weight: 286 lb 14.4 oz (130.1 kg)    Fetal Status: Fetal Heart Rate (bpm): 153   Movement: Present     General:  Alert, oriented and cooperative. Patient is in no acute distress.  Skin: Skin is warm and dry. No rash noted.   Cardiovascular: Normal heart rate noted  Respiratory: Normal respiratory effort, no problems with respiration noted  Abdomen: Soft, gravid, appropriate for gestational age. Pain/Pressure: Absent     Pelvic: Vag. Bleeding: None     Cervical exam deferred        Extremities: Normal range of motion.  Edema: None  Mental Status: Normal mood and affect. Normal behavior. Normal judgment and thought content.   Urinalysis:      Assessment and Plan:  Pregnancy: G5P4004 at [redacted]w[redacted]d  1. Supervision of high risk pregnancy, antepartum 28wk labs today as has not attended several appointments Not able to stay for 1 or 2hr GTT, schedule for one week Tdap given today Discussed contraception, considering LARC  vs BTL, discuss at next appt Follow up in 2 wks, virtual visit - T4, free - CBC - HIV antibody (with reflex) - Tdap vaccine greater than or equal to 37yo IM - RPR  2. Short interval between pregnancies affecting pregnancy, antepartum   3. Maternal obesity affecting pregnancy, antepartum   4. Late prenatal care   5. Language barrier Felipa Evener, speaks English  6. History of VBAC Desires TOLAC, hx of successful VBAC x3 Consent signed  7. Elevated TSH Mildly elevated TSH at last visit Free T4 collected  8. Multigravida of advanced maternal age in second trimester   Preterm labor symptoms and general obstetric precautions including but not limited to vaginal bleeding, contractions, leaking of fluid and fetal movement were reviewed in detail with the patient. Please refer to After Visit Summary for other counseling recommendations.  Return in 2 weeks (on 01/21/2020).   Venora Maples, MD

## 2020-01-07 NOTE — Patient Instructions (Signed)

## 2020-01-08 ENCOUNTER — Encounter: Payer: Self-pay | Admitting: Family Medicine

## 2020-01-08 LAB — HIV ANTIBODY (ROUTINE TESTING W REFLEX): HIV Screen 4th Generation wRfx: NONREACTIVE

## 2020-01-08 LAB — CBC
Hematocrit: 35.2 % (ref 34.0–46.6)
Hemoglobin: 11.9 g/dL (ref 11.1–15.9)
MCH: 30.9 pg (ref 26.6–33.0)
MCHC: 33.8 g/dL (ref 31.5–35.7)
MCV: 91 fL (ref 79–97)
Platelets: 160 10*3/uL (ref 150–450)
RBC: 3.85 x10E6/uL (ref 3.77–5.28)
RDW: 12.6 % (ref 11.7–15.4)
WBC: 6.7 10*3/uL (ref 3.4–10.8)

## 2020-01-08 LAB — RPR: RPR Ser Ql: NONREACTIVE

## 2020-01-08 LAB — T4, FREE: Free T4: 0.86 ng/dL (ref 0.82–1.77)

## 2020-01-09 ENCOUNTER — Other Ambulatory Visit: Payer: Self-pay

## 2020-01-09 DIAGNOSIS — O0990 Supervision of high risk pregnancy, unspecified, unspecified trimester: Secondary | ICD-10-CM

## 2020-01-15 ENCOUNTER — Other Ambulatory Visit: Payer: Medicaid Other

## 2020-01-22 ENCOUNTER — Telehealth (INDEPENDENT_AMBULATORY_CARE_PROVIDER_SITE_OTHER): Payer: Medicaid Other | Admitting: Obstetrics & Gynecology

## 2020-01-22 ENCOUNTER — Other Ambulatory Visit: Payer: Self-pay

## 2020-01-22 DIAGNOSIS — Z98891 History of uterine scar from previous surgery: Secondary | ICD-10-CM | POA: Diagnosis not present

## 2020-01-22 DIAGNOSIS — O0990 Supervision of high risk pregnancy, unspecified, unspecified trimester: Secondary | ICD-10-CM

## 2020-01-22 DIAGNOSIS — O09523 Supervision of elderly multigravida, third trimester: Secondary | ICD-10-CM | POA: Diagnosis not present

## 2020-01-22 DIAGNOSIS — Z3A30 30 weeks gestation of pregnancy: Secondary | ICD-10-CM | POA: Diagnosis not present

## 2020-01-22 NOTE — Patient Instructions (Signed)
Return to office for any scheduled appointments. Call the office or go to the MA[facility] at [facility] [place] if:  You begin to have strong, frequent contractions  Your water breaks.  Sometimes it is a big gush of fluid, sometimes it is just a trickle that keeps getting your panties wet or running down your legs  You have vaginal bleeding.  It is normal to have a small amount of spotting if your cervix was checked.   You do not feel your baby moving like normal.  If you do not, get something to eat and drink and lay down and focus on feeling your baby move.   If your baby is still not moving like normal, you should call the office or go to MAU.  Any other obstetric concerns.   

## 2020-01-22 NOTE — Progress Notes (Signed)
OBSTETRICS PRENATAL VIRTUAL VISIT ENCOUNTER NOTE  Provider location: [facility] [place]   I connected with Priscilla Delgado on 01/22/20 at 10:55 AM EDT by telephone (unable to do video encounter) at home and verified that I am speaking with the correct person using two identifiers.   I discussed the limitations, risks, security and privacy concerns of performing an evaluation and management service virtually and the availability of in person appointments. I also discussed with the patient that there may be a patient responsible charge related to this service. The patient expressed understanding and agreed to proceed. Subjective:  Priscilla Delgado is a 39 y.o. V6H2094 at [redacted]w[redacted]d being seen today for ongoing prenatal care.  She is currently monitored for the following issues for this high-risk pregnancy and has History of VBAC; Advanced maternal age in multigravida; Maternal obesity affecting pregnancy, antepartum; Supervision of high risk pregnancy, antepartum; Language barrier; BMI 40.0-44.9, adult ([facility]CC); History of ulcer disease; Short interval between pregnancies affecting pregnancy, antepartum; Late prenatal care; and Subclinical hypothyroidism on their problem list.  Patient reports no complaints.  Contractions: Not present. Vag. Bleeding: None.  Movement: Present. Denies any leaking of fluid.   The following portions of the patient's history were reviewed and updated as appropriate: allergies, current medications, past family history, past medical history, past social history, past surgical history and problem list.   Objective:  There were no vitals filed for this visit.  Fetal Status:     Movement: Present     General:  Alert, oriented and cooperative. Patient is in no acute distress.  Respiratory: Normal respiratory effort, no problems with respiration noted  Mental Status: Normal mood and affect. Normal behavior. Normal judgment and thought content.  Rest of physical  exam deferred due to type of encounter  Imaging: [place] MFM OB DETAIL +14 WK  Result Date: 12/26/2019 ----------------------------------------------------------------------  OBSTETRICS REPORT                       (Signed Final 12/26/2019 04:27 pm) ---------------------------------------------------------------------- Patient Info  ID #:       [id number]                          D.O.B.:  1980-12-25 (39 yrs)  Name:       Priscilla Delgado                  Visit Date: 12/26/2019 02:24 pm ---------------------------------------------------------------------- Performed By  Performed By:     Ellin Saba        Ref. Address:     [address]                    [id number]                                                             [address]  Attending:        Ma Rings MD         [facility]  Referred By:      [facility] ---------------------------------------------------------------------- Orders   #  Description                          Code         Ordered By   1  [place] MFM OB DETAIL +14 WK              76811.01     CHARLIE PICKENS  ----------------------------------------------------------------------   #  Order #                    Accession #                 Episode #   1  [id number]                  [id number]                  [id number]  ---------------------------------------------------------------------- Indications   Advanced maternal age multigravida 71+,        O45.522   second trimester   Encounter for antenatal screening for          Z36.3   malformations   [redacted] weeks gestation of pregnancy                Z3A.26   Maternal morbid obesity                        O99.210 E66.01   Short interval between pregancies, 2nd         O09.892   trimester   History of cesarean delivery, currently        O34.219   pregnant - VBAC x 3  ---------------------------------------------------------------------- Vital Signs  Weight (lb): 280                                Height:        5'8"  BMI:         42.57 ---------------------------------------------------------------------- Fetal Evaluation  Num Of Fetuses:         1  Fetal Heart Rate(bpm):  155  Cardiac Activity:       Observed  Presentation:           Cephalic  Placenta:               Anterior  P. Cord Insertion:      Not well visualized  Amniotic Fluid  AFI FV:      Within normal limits                              Largest Pocket(cm)                              5.8 ---------------------------------------------------------------------- Biometry  BPD:      60.7  mm     G. Age:  24w 5d          6  %    CI:        72.86   %    70 - 86  FL/HC:      20.5   %    18.6 - 20.4  HC:      226.1  mm     G. Age:  24w 4d          2  %    HC/AC:      1.05        1.04 - 1.22  AC:       215   mm     G. Age:  26w 0d         36  %    FL/BPD:     76.4   %    71 - 87  FL:       46.4  mm     G. Age:  25w 3d         17  %    FL/AC:      21.6   %    20 - 24  HUM:      43.6  mm     G. Age:  26w 0d         42  %  CER:      27.9  mm     G. Age:  25w 0d         30  %  LV:        4.1  mm  Est. FW:     828  gm    1 lb 13 oz      19  % ---------------------------------------------------------------------- OB History  Gravidity:    5         Term:   4  Living:       4 ---------------------------------------------------------------------- Gestational Age  LMP:           26w 1d        Date:  06/26/19                 EDD:   04/01/20  U/S Today:     25w 1d                                        EDD:   04/08/20  Best:          26w 1d     Det. By:  LMP  (06/26/19)          EDD:   04/01/20 ---------------------------------------------------------------------- Anatomy  Cranium:               Appears normal         Aortic Arch:            Not well visualized  Cavum:                 Appears normal         Ductal Arch:            Not well visualized  Ventricles:            Appears  normal         Diaphragm:              Appears normal  Choroid Plexus:        Appears normal         Stomach:                Appears normal, left  sided  Cerebellum:            Appears normal         Abdomen:                Appears normal  Posterior Fossa:       Appears normal         Abdominal Wall:         Appears nml (cord                                                                        insert, abd wall)  Nuchal Fold:           Not applicable (>99    Cord Vessels:           Appears normal ([redacted]                         wks GA)                                        vessel cord)  Lips:                  Appears normal         Kidneys:                Appear normal  Palate:                Appears normal         Bladder:                Appears normal  Thoracic:              Appears normal         Spine:                  Appears normal  Heart:                 Not well visualized    Upper Extremities:      Appears normal  RVOT:                  Not well visualized    Lower Extremities:      Appears normal  LVOT:                  Not well visualized  Other:  Technically difficult due to maternal habitus and fetal position. Nasal          bone visualized. ---------------------------------------------------------------------- Cervix Uterus Adnexa  Cervix  Length:            3.8  cm.  Normal appearance by transabdominal scan.  Left Ovary  Not visualized.  Right Ovary  Within normal limits. ---------------------------------------------------------------------- Comments  This patient was seen for a detailed fetal anatomy scan due  to due to maternal obesity and advanced maternal age.  She denies any significant past medical history and denies  any problems in her current pregnancy.  The patient has not had any screening tests for fetal  aneuploidy drawn in her current pregnancy.  She was informed that the fetal growth and amniotic fluid  level were  appropriate for her gestational age.  There were no obvious fetal anomalies noted on today's  ultrasound exam.  However, today's exam was limited due to  the fetal position.  The patient was informed that anomalies may be missed due  to technical limitations. If the fetus is in a suboptimal position  or maternal habitus is increased, visualization of the fetus in  the maternal uterus may be impaired.  The increased risk of fetal aneuploidy due to advanced  maternal age was discussed. Due to advanced maternal age,  the patient was offered and declined an amniocentesis today  for definitive diagnosis of fetal aneuploidy.  A follow-up exam was scheduled in 4 weeks to complete the  views of the fetal anatomy. ----------------------------------------------------------------------                   Ma RingsVictor Fang, MD Electronically Signed Final Report   12/26/2019 04:27 pm ----------------------------------------------------------------------   Assessment and Plan:  Pregnancy: [id number] at 452w0d 1. History of VBAC Already counseled and consented for TOLAC,  h/o two successful VBACs.  2. Multigravida of advanced maternal age in third trimester Follow up scan scheduled 01/24/20.  3. Supervision of high risk pregnancy, antepartum Preterm labor symptoms and general obstetric precautions including but not limited to vaginal bleeding, contractions, leaking of fluid and fetal movement were reviewed in detail with the patient. I discussed the assessment and treatment plan with the patient. The patient was provided an opportunity to ask questions and all were answered. The patient agreed with the plan and demonstrated an understanding of the instructions. The patient was advised to call back or seek an in-person office evaluation/[facility] for any urgent or concerning symptoms. Please refer to After Visit Summary for other counseling recommendations.   I provided 7 minutes of face-to-face  time during this encounter.  Return in about 2 weeks (around 02/05/2020) [facility]  Future Appointments  Date Time Provider Department Center  01/22/2020 10:55 AM Seng Larch, Jethro BastosUgonna A, MD WOC-WOCA WOC  01/24/2020  8:20 AM WOC-WOCA LAB WOC-WOCA WOC  01/24/2020 10:45 AM WH-MFC NURSE WH-MFC MFC-US  01/24/2020 10:45 AM WH-MFC US 5 WH-MFCUS MFC-US    Jaynie CollinsUgonna Twain Stenseth, MD [facility]

## 2020-01-22 NOTE — Progress Notes (Signed)
I connected with  Tala Eber on 01/22/20 at 10:55 AM EDT by telephone and verified that I am speaking with the correct person using two identifiers.   I discussed the limitations, risks, security and privacy concerns of performing an evaluation and management service by telephone and the availability of in person appointments. I also discussed with the patient that there may be a patient responsible charge related to this service. The patient expressed understanding and agreed to proceed.  Janene Madeira Naylah Cork, CMA 01/22/2020  10:38 AM

## 2020-01-24 ENCOUNTER — Ambulatory Visit (HOSPITAL_COMMUNITY)
Admission: RE | Admit: 2020-01-24 | Discharge: 2020-01-24 | Disposition: A | Discharge status: Home / Self Care | Payer: Medicaid Other | Source: Ambulatory Visit | Attending: Obstetrics and Gynecology | Admitting: Obstetrics and Gynecology

## 2020-01-24 ENCOUNTER — Other Ambulatory Visit (HOSPITAL_COMMUNITY): Payer: Self-pay | Admitting: *Deleted

## 2020-01-24 ENCOUNTER — Other Ambulatory Visit: Payer: Self-pay

## 2020-01-24 ENCOUNTER — Ambulatory Visit (HOSPITAL_COMMUNITY): Payer: Medicaid Other | Admitting: *Deleted

## 2020-01-24 ENCOUNTER — Encounter (HOSPITAL_COMMUNITY): Payer: Self-pay

## 2020-01-24 ENCOUNTER — Other Ambulatory Visit: Payer: Medicaid Other

## 2020-01-24 DIAGNOSIS — O09893 Supervision of other high risk pregnancies, third trimester: Secondary | ICD-10-CM | POA: Diagnosis not present

## 2020-01-24 DIAGNOSIS — O34219 Maternal care for unspecified type scar from previous cesarean delivery: Secondary | ICD-10-CM

## 2020-01-24 DIAGNOSIS — Z362 Encounter for other antenatal screening follow-up: Secondary | ICD-10-CM | POA: Insufficient documentation

## 2020-01-24 DIAGNOSIS — O99213 Obesity complicating pregnancy, third trimester: Secondary | ICD-10-CM

## 2020-01-24 DIAGNOSIS — Z363 Encounter for antenatal screening for malformations: Secondary | ICD-10-CM

## 2020-01-24 DIAGNOSIS — O09523 Supervision of elderly multigravida, third trimester: Secondary | ICD-10-CM | POA: Diagnosis not present

## 2020-01-24 DIAGNOSIS — E661 Drug-induced obesity: Secondary | ICD-10-CM | POA: Diagnosis not present

## 2020-01-24 DIAGNOSIS — O0990 Supervision of high risk pregnancy, unspecified, unspecified trimester: Secondary | ICD-10-CM | POA: Diagnosis not present

## 2020-01-24 DIAGNOSIS — Z3A30 30 weeks gestation of pregnancy: Secondary | ICD-10-CM

## 2020-01-24 DIAGNOSIS — Z789 Other specified health status: Secondary | ICD-10-CM | POA: Diagnosis not present

## 2020-01-24 DIAGNOSIS — O09529 Supervision of elderly multigravida, unspecified trimester: Secondary | ICD-10-CM

## 2020-01-28 ENCOUNTER — Other Ambulatory Visit: Payer: Self-pay

## 2020-01-28 ENCOUNTER — Other Ambulatory Visit: Payer: Medicaid Other

## 2020-01-28 DIAGNOSIS — O0990 Supervision of high risk pregnancy, unspecified, unspecified trimester: Secondary | ICD-10-CM | POA: Diagnosis not present

## 2020-01-29 LAB — CBC
Hematocrit: 34.2 % (ref 34.0–46.6)
Hemoglobin: 11.5 g/dL (ref 11.1–15.9)
MCH: 30.6 pg (ref 26.6–33.0)
MCHC: 33.6 g/dL (ref 31.5–35.7)
MCV: 91 fL (ref 79–97)
Platelets: 161 10*3/uL (ref 150–450)
RBC: 3.76 x10E6/uL — ABNORMAL LOW (ref 3.77–5.28)
RDW: 12.9 % (ref 11.7–15.4)
WBC: 6.7 10*3/uL (ref 3.4–10.8)

## 2020-01-29 LAB — GLUCOSE TOLERANCE, 2 HOURS W/ 1HR
Glucose, 1 hour: 96 mg/dL (ref 65–179)
Glucose, 2 hour: 62 mg/dL — ABNORMAL LOW (ref 65–152)
Glucose, Fasting: 84 mg/dL (ref 65–91)

## 2020-01-29 LAB — HIV ANTIBODY (ROUTINE TESTING W REFLEX): HIV Screen 4th Generation wRfx: NONREACTIVE

## 2020-01-29 LAB — RPR: RPR Ser Ql: NONREACTIVE

## 2020-02-06 DIAGNOSIS — Z029 Encounter for administrative examinations, unspecified: Secondary | ICD-10-CM

## 2020-02-11 ENCOUNTER — Encounter: Payer: Self-pay | Admitting: Family Medicine

## 2020-02-11 ENCOUNTER — Encounter: Payer: Medicaid Other | Admitting: Obstetrics & Gynecology

## 2020-02-11 NOTE — Progress Notes (Signed)
Called pt x2 at 1550; VM not set up. Called pt contact; VM not set up.   Called pt at 1601; VM not set up. Called pt contact; VM not set up.   Front office notified to send a follow up letter.  Fleet Contras RN 02/11/20

## 2020-02-12 NOTE — Progress Notes (Signed)
Patient ID: Priscilla Delgado, female   DOB: [birth year], 39 y.o.   MRN: [id number] Patient seen and assessed by nursing staff during this encounter. I have reviewed the chart and agree with the documentation and plan. I have also made any necessary editorial changes.  Scheryl Darter, MD 02/12/2020 8:47 AM

## 2020-02-18 ENCOUNTER — Encounter (HOSPITAL_COMMUNITY): Payer: Self-pay

## 2020-02-18 ENCOUNTER — Ambulatory Visit (HOSPITAL_COMMUNITY): Payer: Medicaid Other | Attending: Obstetrics and Gynecology

## 2020-02-18 ENCOUNTER — Encounter: Payer: Medicaid Other | Admitting: Family Medicine

## 2020-02-18 ENCOUNTER — Ambulatory Visit (HOSPITAL_COMMUNITY): Payer: Medicaid Other

## 2020-02-20 DIAGNOSIS — H5203 Hypermetropia, bilateral: Secondary | ICD-10-CM | POA: Diagnosis not present

## 2020-02-25 ENCOUNTER — Inpatient Hospital Stay (HOSPITAL_BASED_OUTPATIENT_CLINIC_OR_DEPARTMENT_OTHER): Payer: Medicaid Other

## 2020-02-25 ENCOUNTER — Encounter (HOSPITAL_COMMUNITY): Payer: Self-pay | Admitting: Obstetrics and Gynecology

## 2020-02-25 ENCOUNTER — Inpatient Hospital Stay (HOSPITAL_COMMUNITY)
Admission: AD | Admit: 2020-02-25 | Discharge: 2020-02-25 | Disposition: A | Discharge status: Home / Self Care | Payer: Medicaid Other | Attending: Obstetrics and Gynecology | Admitting: Obstetrics and Gynecology

## 2020-02-25 ENCOUNTER — Other Ambulatory Visit: Payer: Self-pay

## 2020-02-25 DIAGNOSIS — Z3A34 34 weeks gestation of pregnancy: Secondary | ICD-10-CM | POA: Diagnosis not present

## 2020-02-25 DIAGNOSIS — O99213 Obesity complicating pregnancy, third trimester: Secondary | ICD-10-CM

## 2020-02-25 DIAGNOSIS — O34219 Maternal care for unspecified type scar from previous cesarean delivery: Secondary | ICD-10-CM | POA: Diagnosis not present

## 2020-02-25 DIAGNOSIS — O09523 Supervision of elderly multigravida, third trimester: Secondary | ICD-10-CM

## 2020-02-25 DIAGNOSIS — O288 Other abnormal findings on antenatal screening of mother: Secondary | ICD-10-CM

## 2020-02-25 DIAGNOSIS — Z363 Encounter for antenatal screening for malformations: Secondary | ICD-10-CM | POA: Diagnosis not present

## 2020-02-25 DIAGNOSIS — O368130 Decreased fetal movements, third trimester, not applicable or unspecified: Secondary | ICD-10-CM | POA: Diagnosis not present

## 2020-02-25 DIAGNOSIS — O0990 Supervision of high risk pregnancy, unspecified, unspecified trimester: Secondary | ICD-10-CM

## 2020-02-25 DIAGNOSIS — O09893 Supervision of other high risk pregnancies, third trimester: Secondary | ICD-10-CM

## 2020-02-25 DIAGNOSIS — O368131 Decreased fetal movements, third trimester, fetus 1: Secondary | ICD-10-CM | POA: Diagnosis not present

## 2020-02-25 DIAGNOSIS — Z789 Other specified health status: Secondary | ICD-10-CM

## 2020-02-25 LAB — URINALYSIS, ROUTINE W REFLEX MICROSCOPIC
Bilirubin Urine: NEGATIVE
Glucose, UA: NEGATIVE mg/dL
Hgb urine dipstick: NEGATIVE
Ketones, ur: NEGATIVE mg/dL
Nitrite: NEGATIVE
Protein, ur: NEGATIVE mg/dL
Specific Gravity, Urine: 1.011 (ref 1.005–1.030)
pH: 6 (ref 5.0–8.0)

## 2020-02-25 NOTE — Progress Notes (Signed)
Pt states she has felt the baby move twice since being on the monitors. Will continue to monitor.

## 2020-02-25 NOTE — MAU Provider Note (Signed)
History     CSN: [id number]  Arrival date and time: 02/25/20 1617   First Provider Initiated Contact with Patient 02/25/20 1721      Chief Complaint  Patient presents with  . Decreased Fetal Movement   Priscilla Delgado is a 39 y.o. [id number] at 22w6dwho [facility]  She presents today for Decreased Fetal Movement.  She reports that she has not felt movement since yesterday morning.  She states she has ate and drank without movement.  She also reports some pelvic pressure that "comes and goes," but has been ongoing. Patient reports she has felt 2 movement since arrival.  Patient reports she last ate at 1400 and reports she had rice, greens, and 2 sardines.  Patient reports she drank porridge this morning.     OB History    Gravida  5   Para  4   Term  4   Preterm  0   AB  0   Living  4     SAB  0   TAB  0   Ectopic  0   Multiple  0   Live Births  4        Obstetric Comments  C/s for breech- leg was coming down        Past Medical History:  Diagnosis Date  . Ulcer of the stomach and intestine     Past Surgical History:  Procedure Laterality Date  . CESAREAN SECTION      History reviewed. No pertinent family history.  Social History   Tobacco Use  . Smoking status: Never Smoker  . Smokeless tobacco: Never Used  Substance Use Topics  . Alcohol use: No  . Drug use: No    Allergies: No Known Allergies  Medications Prior to Admission  Medication Sig Dispense Refill Last Dose  . acetaminophen (TYLENOL) 500 MG tablet Take 500 mg by mouth every 6 (six) hours as needed.   Past Week at Unknown time  . aspirin EC 81 MG tablet Take 1 tablet (81 mg total) by mouth daily. 60 tablet 2 02/25/2020 at Unknown time  . Prenatal Vit-Fe Fumarate-FA (PRENATAL COMPLETE) 14-0.4 MG TABS Take 1 tablet by mouth daily. 30 each 2 02/25/2020 at Unknown time  . Blood Pressure Monitoring (BLOOD PRESSURE KIT) DEVI 1 Device by Does not apply route daily. Large Size  Cuff  ICD 10: Z34.00 1 each 0   . Elastic Bandages & Supports (COMFORT FIT MATERNITY SUPP LG) MISC 1 Units by Does not apply route daily. 1 each 0   . Prenatal Vit-Fe Fumarate-FA (PRENATAL VITAMINS PO) Take 1 tablet by mouth daily.       Review of Systems  Constitutional: Negative for chills and fever.  Respiratory: Negative for cough and shortness of breath.   Gastrointestinal: Negative for abdominal pain, constipation, diarrhea, nausea and vomiting.  Genitourinary: Positive for pelvic pain. Negative for difficulty urinating, dysuria, vaginal bleeding and vaginal discharge.  Neurological: Negative for dizziness, light-headedness and headaches.   Physical Exam   Blood pressure (!) 115/55, pulse 75, temperature 98.2 F (36.8 C), resp. rate 18, height 5' 8" (1.727 m), weight 133.4 kg, last menstrual period 06/26/2019, currently breastfeeding.  Physical Exam  Constitutional: She is oriented to person, place, and time. She appears well-developed and well-nourished.  HENT:  Head: Normocephalic and atraumatic.  Eyes: Conjunctivae are normal.  Cardiovascular: Normal rate, regular rhythm and normal heart sounds.  Respiratory: Effort normal.  GI: Soft.  Gravid  Musculoskeletal:  General: Normal range of motion.     Cervical back: Normal range of motion.  Neurological: She is alert and oriented to person, place, and time.  Skin: Skin is warm and dry.  Psychiatric: She has a normal mood and affect. Her behavior is normal.    Fetal Assessment 150 bpm, Mod Var, -Decels, +10x10Accels Toco: None  MAU Course   Results for orders placed or performed during the hospital encounter of 02/25/20 (from the past 24 hour(s))  Urinalysis, Routine w reflex microscopic     Status: Abnormal   Collection Time: 02/25/20  5:00 PM  Result Value Ref Range   Color, Urine YELLOW YELLOW   APPearance HAZY (A) CLEAR   Specific Gravity, Urine 1.011 1.005 - 1.030   pH 6.0 5.0 - 8.0   Glucose, UA  NEGATIVE NEGATIVE mg/dL   Hgb urine dipstick NEGATIVE NEGATIVE   Bilirubin Urine NEGATIVE NEGATIVE   Ketones, ur NEGATIVE NEGATIVE mg/dL   Protein, ur NEGATIVE NEGATIVE mg/dL   Nitrite NEGATIVE NEGATIVE   Leukocytes,Ua TRACE (A) NEGATIVE   RBC / HPF 0-5 0 - 5 RBC/hpf   WBC, UA 0-5 0 - 5 WBC/hpf   Bacteria, UA RARE (A) NONE SEEN   Squamous Epithelial / LPF 0-5 0 - 5   Mucus PRESENT        MDM PE Labs: UA EFM  Assessment and Plan  39 year old [id number]  SIUP at 34.6weeks Cat I FT DFM NR NST   -POC reviewed. -Patient informed that fluids and some type of protein likely necessary as it has been hours since last intake. -Patient states she is not hungry and declines food.  Patient willing to drink water, but declines sprite or OJ. -NST NR -Will send for BPP for reassurance of fetal well-being.   Maryann Conners MSN, CNM 02/25/2020, 5:21 PM    Reassessment (7:16 PM) BPP 8/8  -Results discussed with patient. -Overall fetal testing 8/10 which is reassuring. -Patient educated on proper nutritional intake to promote fetal movement and overall fetal health.   -Encouraged 3 meals daily with high protein snacks in between.  Patient reports she does not feel hungry throughout the day.  Reassured of normalcy and reiterated need for nutritional intake for fetal wellbeing.  -Will discharge to home. -Patient instructed to keep appt as scheduled.  -Encouraged to call or return to MAU if symptoms worsen or with the onset of new symptoms. -Discharged to home in stable condition.  Maryann Conners MSN, CNM Advanced Practice Provider, [facility]

## 2020-02-25 NOTE — Discharge Instructions (Signed)
Contraception Choices Contraception, also called birth control, refers to methods or devices that prevent pregnancy. Hormonal methods Contraceptive implant  A contraceptive implant is a thin, plastic tube that contains a hormone. It is inserted into the upper part of the arm. It can remain in place for up to 3 years. Progestin-only injections Progestin-only injections are injections of progestin, a synthetic form of the hormone progesterone. They are given every 3 months by a health care provider. Birth control pills  Birth control pills are pills that contain hormones that prevent pregnancy. They must be taken once a day, preferably at the same time each day. Birth control patch  The birth control patch contains hormones that prevent pregnancy. It is placed on the skin and must be changed once a week for three weeks and removed on the fourth week. A prescription is needed to use this method of contraception. Vaginal ring  A vaginal ring contains hormones that prevent pregnancy. It is placed in the vagina for three weeks and removed on the fourth week. After that, the process is repeated with a new ring. A prescription is needed to use this method of contraception. Emergency contraceptive Emergency contraceptives prevent pregnancy after unprotected sex. They come in pill form and can be taken up to 5 days after sex. They work best the sooner they are taken after having sex. Most emergency contraceptives are available without a prescription. This method should not be used as your only form of birth control. Barrier methods Female condom  A female condom is a thin sheath that is worn over the penis during sex. Condoms keep sperm from going inside a woman's body. They can be used with a spermicide to increase their effectiveness. They should be disposed after a single use. Female condom  A female condom is a soft, loose-fitting sheath that is put into the vagina before sex. The condom keeps sperm  from going inside a woman's body. They should be disposed after a single use. Diaphragm  A diaphragm is a soft, dome-shaped barrier. It is inserted into the vagina before sex, along with a spermicide. The diaphragm blocks sperm from entering the uterus, and the spermicide kills sperm. A diaphragm should be left in the vagina for 6-8 hours after sex and removed within 24 hours. A diaphragm is prescribed and fitted by a health care provider. A diaphragm should be replaced every 1-2 years, after giving birth, after gaining more than 15 lb (6.8 kg), and after pelvic surgery. Cervical cap  A cervical cap is a round, soft latex or plastic cup that fits over the cervix. It is inserted into the vagina before sex, along with spermicide. It blocks sperm from entering the uterus. The cap should be left in place for 6-8 hours after sex and removed within 48 hours. A cervical cap must be prescribed and fitted by a health care provider. It should be replaced every 2 years. Sponge  A sponge is a soft, circular piece of polyurethane foam with spermicide on it. The sponge helps block sperm from entering the uterus, and the spermicide kills sperm. To use it, you make it wet and then insert it into the vagina. It should be inserted before sex, left in for at least 6 hours after sex, and removed and thrown away within 30 hours. Spermicides Spermicides are chemicals that kill or block sperm from entering the cervix and uterus. They can come as a cream, jelly, suppository, foam, or tablet. A spermicide should be inserted into the   vagina with an applicator at least 10-15 minutes before sex to allow time for it to work. The process must be repeated every time you have sex. Spermicides do not require a prescription. Intrauterine contraception Intrauterine device (IUD) An IUD is a T-shaped device that is put in a woman's uterus. There are two types:  Hormone IUD.This type contains progestin, a synthetic form of the hormone  progesterone. This type can stay in place for 3-5 years.  Copper IUD.This type is wrapped in copper wire. It can stay in place for 10 years.  Permanent methods of contraception Female tubal ligation In this method, a woman's fallopian tubes are sealed, tied, or blocked during surgery to prevent eggs from traveling to the uterus. Hysteroscopic sterilization In this method, a small, flexible insert is placed into each fallopian tube. The inserts cause scar tissue to form in the fallopian tubes and block them, so sperm cannot reach an egg. The procedure takes about 3 months to be effective. Another form of birth control must be used during those 3 months. Female sterilization This is a procedure to tie off the tubes that carry sperm (vasectomy). After the procedure, the man can still ejaculate fluid (semen). Natural planning methods Natural family planning In this method, a couple does not have sex on days when the woman could become pregnant. Calendar method This means keeping track of the length of each menstrual cycle, identifying the days when pregnancy can happen, and not having sex on those days. Ovulation method In this method, a couple avoids sex during ovulation. Symptothermal method This method involves not having sex during ovulation. The woman typically checks for ovulation by watching changes in her temperature and in the consistency of cervical mucus. Post-ovulation method In this method, a couple waits to have sex until after ovulation. Summary  Contraception, also called birth control, means methods or devices that prevent pregnancy.  Hormonal methods of contraception include implants, injections, pills, patches, vaginal rings, and emergency contraceptives.  Barrier methods of contraception can include female condoms, female condoms, diaphragms, cervical caps, sponges, and spermicides.  There are two types of IUDs (intrauterine devices). An IUD can be put in a woman's uterus to  prevent pregnancy for 3-5 years.  Permanent sterilization can be done through a procedure for males, females, or both.  Natural family planning methods involve not having sex on days when the woman could become pregnant. This information is not intended to replace advice given to you by your health care provider. Make sure you discuss any questions you have with your health care provider. Document Revised: 10/13/2017 Document Reviewed: 11/13/2016 Elsevier Patient Education  2020 Elsevier Inc.  

## 2020-02-25 NOTE — MAU Note (Signed)
Pt has not felt baby move much since yesterday. fetl him maybe twice today. Also c/o lower abd pressure "feels heavy down there".

## 2020-02-27 ENCOUNTER — Encounter: Payer: Medicaid Other | Admitting: Obstetrics & Gynecology

## 2020-02-29 ENCOUNTER — Encounter (HOSPITAL_COMMUNITY): Payer: Self-pay | Admitting: Obstetrics and Gynecology

## 2020-02-29 ENCOUNTER — Other Ambulatory Visit: Payer: Self-pay

## 2020-02-29 ENCOUNTER — Inpatient Hospital Stay (HOSPITAL_COMMUNITY)
Admission: AD | Admit: 2020-02-29 | Discharge: 2020-02-29 | Disposition: A | Discharge status: Home / Self Care | Payer: Medicaid Other | Attending: Obstetrics and Gynecology | Admitting: Obstetrics and Gynecology

## 2020-02-29 DIAGNOSIS — Z3A35 35 weeks gestation of pregnancy: Secondary | ICD-10-CM | POA: Diagnosis not present

## 2020-02-29 DIAGNOSIS — O0933 Supervision of pregnancy with insufficient antenatal care, third trimester: Secondary | ICD-10-CM | POA: Insufficient documentation

## 2020-02-29 DIAGNOSIS — N939 Abnormal uterine and vaginal bleeding, unspecified: Secondary | ICD-10-CM | POA: Diagnosis present

## 2020-02-29 DIAGNOSIS — O4693 Antepartum hemorrhage, unspecified, third trimester: Secondary | ICD-10-CM | POA: Diagnosis not present

## 2020-02-29 DIAGNOSIS — Z7982 Long term (current) use of aspirin: Secondary | ICD-10-CM | POA: Insufficient documentation

## 2020-02-29 DIAGNOSIS — O34219 Maternal care for unspecified type scar from previous cesarean delivery: Secondary | ICD-10-CM | POA: Diagnosis not present

## 2020-02-29 DIAGNOSIS — O09523 Supervision of elderly multigravida, third trimester: Secondary | ICD-10-CM | POA: Diagnosis not present

## 2020-02-29 NOTE — Discharge Instructions (Signed)

## 2020-02-29 NOTE — MAU Provider Note (Signed)
Chief Complaint:  Contractions and Vaginal Bleeding   First Provider Initiated Contact with Patient 02/29/20 0349     HPI: Priscilla Delgado is a 39 y.o. [id number] at 44w3dho presents to maternity admissions reporting vaginal bleeding.  Has some contractions, about every 10 minutes. She reports good fetal movement, denies LOF, vaginal itching/burning, urinary symptoms, h/a, dizziness, n/v, diarrhea, constipation or fever/chills.    Has missed her last 4 OB appointments.  Cannot say why she did.  [place]    Vaginal Bleeding The patient's primary symptoms include pelvic pain and vaginal bleeding. The patient's pertinent negatives include no genital itching, genital lesions or genital odor. This is a new problem. The current episode started today. The problem occurs intermittently. The problem has been resolved. The pain is mild. She is pregnant. Associated symptoms include abdominal pain. Pertinent negatives include no chills, constipation, diarrhea, dysuria, fever, headaches, nausea or vomiting. The vaginal discharge was bloody. The vaginal bleeding is lighter than menses. She has not been passing clots. She has not been passing tissue. Nothing aggravates the symptoms. She has tried nothing for the symptoms.    RN note: Pt reports to MAU stating around 1600 she started having heavy vaginal bleeding. Pt reports it was enough blood to wear a pad. Pt reports ctx pain every 10 min. No LOF. +FM.   Past Medical History: Past Medical History:  Diagnosis Date  . Ulcer of the stomach and intestine     Past obstetric history: OB History  Gravida Para Term Preterm AB Living  '5 4 4 '$ 0 0 4  SAB TAB Ectopic Multiple Live Births  0 0 0 0 4    # Outcome Date GA Lbr Len/2nd Weight Sex Delivery Anes PTL Lv  5 Current           4 Term 02/19/19 365w2d4:01 / 00:06 3435 g M VBAC None  LIV  3 Term 03/14/15 383w1d:09 / 00:15 2975 g M VBAC None  LIV  2 Term 07/03/10 40w35w0d00 g F  Vag-Spont   LIV  1 Term 10/19/08 40w056w0d0 g M CS-Unspec Spinal  LIV    Obstetric Comments  C/s for breech- leg was coming down    Past Surgical History: Past Surgical History:  Procedure Laterality Date  . CESAREAN SECTION      Family History: History reviewed. No pertinent family history.  Social History: Social History   Tobacco Use  . Smoking status: Never Smoker  . Smokeless tobacco: Never Used  Substance Use Topics  . Alcohol use: No  . Drug use: No    Allergies: No Known Allergies  Meds:  Medications Prior to Admission  Medication Sig Dispense Refill Last Dose  . aspirin EC 81 MG tablet Take 1 tablet (81 mg total) by mouth daily. 60 tablet 2 02/28/2020 at Unknown time  . Prenatal Vit-Fe Fumarate-FA (PRENATAL COMPLETE) 14-0.4 MG TABS Take 1 tablet by mouth daily. 30 each 2 02/28/2020 at Unknown time  . acetaminophen (TYLENOL) 500 MG tablet Take 500 mg by mouth every 6 (six) hours as needed.   Unknown at Unknown time  . Blood Pressure Monitoring (BLOOD PRESSURE KIT) DEVI 1 Device by Does not apply route daily. Large Size Cuff  ICD 10: Z34.00 1 each 0 Unknown at Unknown time  . Elastic Bandages & Supports (COMFORT FIT MATERNITY SUPP LG) MISC 1 Units by Does not apply route daily. 1 each 0 Unknown at Unknown  time  . Prenatal Vit-Fe Fumarate-FA (PRENATAL VITAMINS PO) Take 1 tablet by mouth daily.   Unknown at Unknown time    I have reviewed patient's Past Medical Hx, Surgical Hx, Family Hx, Social Hx, medications and allergies.   ROS:  Review of Systems  Constitutional: Negative for chills and fever.  Gastrointestinal: Positive for abdominal pain. Negative for constipation, diarrhea, nausea and vomiting.  Genitourinary: Positive for pelvic pain and vaginal bleeding. Negative for dysuria.  Neurological: Negative for headaches.   Other systems negative  Physical Exam   Patient Vitals for the past 24 hrs:  BP Temp src Pulse Resp Weight  02/29/20 0318 122/65 -- 75  -- --  02/29/20 0303 124/61 Oral -- 17 --  02/29/20 0301 -- -- -- -- 131.3 kg   Constitutional: Well-developed, well-nourished female in no acute distress.  Cardiovascular: normal rate and rhythm Respiratory: normal effort, clear to auscultation bilaterally GI: Abd soft, non-tender, gravid appropriate for gestational age.   No rebound or guarding. MS: Extremities nontender, no edema, normal ROM Neurologic: Alert and oriented x 4.  GU: Neg CVAT.  PELVIC EXAM: Cervix pink, visually closed, without lesion, scant white creamy discharge, vaginal walls and external genitalia normal   No blood visible    Dilation: 2 Effacement (%): 70 Station: -2 Presentation: Vertex Exam by:: Hansel Feinstein CNM  FHT:  Baseline 145 , moderate variability, accelerations present, no decelerations Contractions:  Irregular    Labs:  O/Positive/-- (02/01 1001)  Imaging:    MAU Course/MDM: NST reviewed and is reassuring, category I.  Treatments in MAU included EFM  We monitored her and fetus was reassuring.  We saw no vaginal bleeding Discussed importance of keeping prenatal office appointments.    Assessment: Single IUP at 62w3dBleeding in third trimester Insufficient prenatal care due to missing appointments  Plan: Discharge home Labor precautions and fetal kick counts Bleeding precautions Follow up in Office for prenatal visits   Encouraged to return here or to other Urgent Care/ED if she develops worsening of symptoms, increase in pain, fever, or other concerning symptoms.   Pt stable at time of discharge.  MHansel FeinsteinCNM, MSN Certified Nurse-Midwife 02/29/2020 3:49 AM

## 2020-02-29 NOTE — MAU Note (Signed)
Pt reports to MAU stating around 1600 she started having heavy vaginal bleeding. Pt reports it was enough blood to wear a pad. Pt reports ctx pain every 10 min. No LOF. +FM.

## 2020-03-03 NOTE — Progress Notes (Signed)
Pt spoke with me today about wanting to be taken out of work.  Pt states that she has been out of work since last Tuesday and her job is requesting that she be taken out of work because she does not qualify for [employer] and she is having a lot of pelvic pain.  I informed that at this time we can not medically take her out of work.  That is a discussion that she needs to have with her employer.  I advised pt that I can provide a work restriction letter however we can not medically take her out of work.  I advised pt that she has an appt on 03/10/20 in which she can speak with a provider however at this time we do not have a medical indication to take her out of work.  Pt verbalized understanding.   Addison Naegeli, RN  03/03/20

## 2020-03-05 DIAGNOSIS — H5213 Myopia, bilateral: Secondary | ICD-10-CM | POA: Diagnosis not present

## 2020-03-10 ENCOUNTER — Ambulatory Visit (INDEPENDENT_AMBULATORY_CARE_PROVIDER_SITE_OTHER): Payer: Medicaid Other | Admitting: Clinical

## 2020-03-10 ENCOUNTER — Other Ambulatory Visit: Payer: Self-pay

## 2020-03-10 ENCOUNTER — Other Ambulatory Visit (HOSPITAL_COMMUNITY)
Admission: RE | Admit: 2020-03-10 | Discharge: 2020-03-10 | Disposition: A | Discharge status: Home / Self Care | Payer: Medicaid Other | Source: Ambulatory Visit | Attending: Obstetrics & Gynecology | Admitting: Obstetrics & Gynecology

## 2020-03-10 ENCOUNTER — Ambulatory Visit (INDEPENDENT_AMBULATORY_CARE_PROVIDER_SITE_OTHER): Payer: Medicaid Other | Admitting: Women's Health

## 2020-03-10 VITALS — BP 105/71 | HR 91 | Wt 290.8 lb

## 2020-03-10 DIAGNOSIS — O26893 Other specified pregnancy related conditions, third trimester: Secondary | ICD-10-CM

## 2020-03-10 DIAGNOSIS — O09523 Supervision of elderly multigravida, third trimester: Secondary | ICD-10-CM

## 2020-03-10 DIAGNOSIS — Z3A36 36 weeks gestation of pregnancy: Secondary | ICD-10-CM

## 2020-03-10 DIAGNOSIS — E669 Obesity, unspecified: Secondary | ICD-10-CM | POA: Diagnosis not present

## 2020-03-10 DIAGNOSIS — F4323 Adjustment disorder with mixed anxiety and depressed mood: Secondary | ICD-10-CM | POA: Diagnosis not present

## 2020-03-10 DIAGNOSIS — Z98891 History of uterine scar from previous surgery: Secondary | ICD-10-CM | POA: Diagnosis not present

## 2020-03-10 DIAGNOSIS — O0990 Supervision of high risk pregnancy, unspecified, unspecified trimester: Secondary | ICD-10-CM

## 2020-03-10 DIAGNOSIS — E038 Other specified hypothyroidism: Secondary | ICD-10-CM

## 2020-03-10 DIAGNOSIS — R102 Pelvic and perineal pain: Secondary | ICD-10-CM

## 2020-03-10 DIAGNOSIS — Z658 Other specified problems related to psychosocial circumstances: Secondary | ICD-10-CM | POA: Diagnosis not present

## 2020-03-10 DIAGNOSIS — Z6841 Body Mass Index (BMI) 40.0 and over, adult: Secondary | ICD-10-CM

## 2020-03-10 DIAGNOSIS — E039 Hypothyroidism, unspecified: Secondary | ICD-10-CM | POA: Diagnosis not present

## 2020-03-10 DIAGNOSIS — Z1331 Encounter for screening for depression: Secondary | ICD-10-CM

## 2020-03-10 DIAGNOSIS — O99213 Obesity complicating pregnancy, third trimester: Secondary | ICD-10-CM | POA: Diagnosis not present

## 2020-03-10 DIAGNOSIS — Z789 Other specified health status: Secondary | ICD-10-CM

## 2020-03-10 DIAGNOSIS — O0993 Supervision of high risk pregnancy, unspecified, third trimester: Secondary | ICD-10-CM | POA: Diagnosis not present

## 2020-03-10 MED ORDER — CYCLOBENZAPRINE HCL 10 MG PO TABS: 10.0000 mg | ORAL_TABLET | Freq: Three times a day (TID) | ORAL | 1 refills | Status: DC | PRN

## 2020-03-10 NOTE — BH Specialist Note (Signed)
Integrated Behavioral Health Initial Visit  MRN: [id number] Name: Priscilla Delgado  Number of Integrated Behavioral Health Clinician visits:: 1/6 Session Start time: 12:33  Session End time: 12:50 Total time: 17  Type of Service: Integrated Behavioral Health- Individual/Family Interpretor:No. Interpretor Name and Language: n/a   Warm Hand Off Completed.       SUBJECTIVE: Priscilla Delgado is a 39 y.o. female accompanied by n/a Patient was referred by Donia Ast, NP for positive depression screening. Patient reports the following symptoms/concerns: Pt states her primary concerns today is work and financial stress as a single mother with no support, as well as pelvic pain and stress affecting her ability to work.  Duration of problem: Current pregnancy; Severity of problem: severe  OBJECTIVE: Mood: Anxious and Depressed and Affect: Appropriate Risk of harm to self or others: No plan to harm self or others  LIFE CONTEXT: Family and Social: Pt has four children (11,9,4,1); and little to no social support School/Work: Pt working fulltime (currently out of work) Self-Care: - Life Changes: Current pregnancy, financial stress being out of work  GOALS ADDRESSED: Patient will: 1. Reduce symptoms of: anxiety, depression and stress 2. Increase knowledge and/or ability of: stress reduction  3. Demonstrate ability to: Increase healthy adjustment to current life circumstances and Increase adequate support systems for patient/family  INTERVENTIONS: Interventions utilized: Solution-Focused Strategies, Psychoeducation and/or Health Education and Link to [facility]  Standardized Assessments completed: GAD-7 and PHQ 9  ASSESSMENT: Patient currently experiencing Adjustment disorder with mixed anxiety and depressed mood and Psychosocial stress.   Patient may benefit from psychoeducation and brief therapeutic interventions regarding coping with symptoms of anxiety, depression and life  stress .  PLAN: 1. Follow up with behavioral health clinician on : One week 2. Behavioral recommendations:  -Accept referral to psychiatry and community resources -Take FMLA paperwork to front desk today -Read Postpartum Planner this week, and be familiar with community resources that may be helpful postpartum 3. Referral(s): Integrated Art gallery manager (In Clinic), Community Mental Health Services (LME/Outside Clinic) and [employer] Resources:  Finances and Housing 4. "From scale of 1-10, how likely are you to follow plan?": -  Rae Lips, LCSW  Depression screen [facility] 2/9 03/10/2020 01/07/2020 01/07/2020 12/13/2018 11/16/2018  Decreased Interest 3 0 0 0 0  Down, Depressed, Hopeless - 0 0 0 0  PHQ - 2 Score 3 0 0 0 0  Altered sleeping - 0 0 3 -  Tired, decreased energy 3 2 0 3 -  Change in appetite - 0 0 2 -  Feeling bad or failure about yourself  3 1 0 2 -  Trouble concentrating - 0 0 0 -  Moving slowly or fidgety/restless - 0 0 0 -  Suicidal thoughts - 0 0 0 -  PHQ-9 Score - 3 0 10 -   GAD 7 : Generalized Anxiety Score 03/10/2020 01/07/2020 01/07/2020 12/13/2018  Nervous, Anxious, on Edge 3 0 0 0  Control/stop worrying 3 0 0 0  Worry too much - different things 3 0 0 1  Trouble relaxing 3 0 0 0  Restless 3 0 0 0  Easily annoyed or irritable 3 0 0 0  Afraid - awful might happen 3 0 0 0  Total GAD 7 Score 21 0 0 1

## 2020-03-10 NOTE — Progress Notes (Deleted)
Subjective:  Priscilla Delgado is a 39 y.o. [id number] at [redacted]w[redacted]d being seen today for ongoing prenatal care.  She is currently monitored for the following issues for this {Blank single:19197::"high-risk","low-risk"} pregnancy and has History of VBAC; Advanced maternal age in multigravida; Maternal obesity affecting pregnancy, antepartum; Supervision of high risk pregnancy, antepartum; Language barrier; BMI 40.0-44.9, adult (HCC); History of ulcer disease; Short interval between pregnancies affecting pregnancy, antepartum; Late prenatal care; and Subclinical hypothyroidism on their problem list.  Patient reports {sx:[id number]}.  Contractions: Irregular. Vag. Bleeding: None.  Movement: Present. Denies leaking of fluid.   The following portions of the patient's history were reviewed and updated as appropriate: allergies, current medications, past family history, past medical history, past social history, past surgical history and problem list. Problem list updated.  Objective:   Vitals:   03/10/20 1053  Weight: 290 lb 12.8 oz (131.9 kg)    Fetal Status:     Movement: Present     General:  Alert, oriented and cooperative. Patient is in no acute distress.  Skin: Skin is warm and dry. No rash noted.   Cardiovascular: Normal heart rate noted  Respiratory: Normal respiratory effort, no problems with respiration noted  Abdomen: Soft, gravid, appropriate for gestational age. Pain/Pressure: Present     Pelvic: Vag. Bleeding: None     {Blank single:19197::"Cervical exam performed","Cervical exam deferred"}        Extremities: Normal range of motion.  Edema: Trace  Mental Status: Normal mood and affect. Normal behavior. Normal judgment and thought content.   Urinalysis:      Assessment and Plan:  Pregnancy: [id number] at [redacted]w[redacted]d  1. Supervision of high risk pregnancy, antepartum *** - Culture, beta strep (group b only) - Cervicovaginal ancillary only( [place]) - discussed contraception and BTL  2.  Subclinical hypothyroidism *** -no thyroid labs since March -TSH/Free T4 today  3. BMI 40.0-44.9, adult (HCC) *** -pt on bASA daily -growth [place] scheduled 03/18/2020, pt aware  4. Language barrier *** -pt declines translator  5. Multigravida of advanced maternal age in third trimester *** -39yo at delivery by chart, but pt reports she does not know her exact birthday, unsure of year.  6. History of VBAC -First pregnancy with c-section in [place] -Followed by 2 VBAC 2011 and 2016 and 2020 -Already counseled and consented for TOLAC   Term labor symptoms and general obstetric precautions including but not limited to vaginal bleeding, contractions, leaking of fluid and fetal movement were reviewed in detail with the patient. Please refer to After Visit Summary for other counseling recommendations.  Return in about 1 week (around 03/17/2020) for in-person HOB/APP OK.   Auna Mikkelsen, Odie Sera, NP

## 2020-03-10 NOTE — Progress Notes (Signed)
Scheduled PT at [facility] on 03/18/20 @ 1400.  Pt notified.

## 2020-03-10 NOTE — Patient Instructions (Addendum)
Maternity Assessment Unit (MAU)  The Maternity Assessment Unit (MAU) is located at the [facility] and [place] at [facility]. The address is: [address], [place], [place], [place] [address]. Please see map below for additional directions.    The Maternity Assessment Unit is designed to help you during your pregnancy, and for up to 6 weeks after delivery, with any pregnancy- or postpartum-related emergencies, if you think you are in labor, or if your water has broken. For example, if you experience nausea and vomiting, vaginal bleeding, severe abdominal or pelvic pain, elevated blood pressure or other problems related to your pregnancy or postpartum time, please come to the Maternity Assessment Unit for assistance.                     Safe Medications in Pregnancy    Acne: Benzoyl Peroxide Salicylic Acid  Backache/Headache: Tylenol: 2 regular strength every 4 hours OR              2 Extra strength every 6 hours  Colds/Coughs/Allergies: Benadryl (alcohol free) 25 mg every 6 hours as needed Breath right strips Claritin Cepacol throat lozenges Chloraseptic throat spray Cold-Eeze- up to three times per day Cough drops, alcohol free Flonase (by prescription only) Guaifenesin Mucinex Robitussin DM (plain only, alcohol free) Saline nasal spray/drops Sudafed (pseudoephedrine) & Actifed ** use only after [redacted] weeks gestation and if you do not have high blood pressure Tylenol Vicks Vaporub Zinc lozenges Zyrtec   Constipation: Colace Ducolax suppositories Fleet enema Glycerin suppositories Metamucil Milk of magnesia Miralax Senokot Smooth move tea  Diarrhea: Kaopectate Imodium A-D  *NO pepto Bismol  Hemorrhoids: Anusol Anusol HC Preparation H Tucks  Indigestion: Tums Maalox Mylanta Zantac  Pepcid  Insomnia: Benadryl (alcohol free) 84m every 6 hours as needed Tylenol PM Unisom, no Gelcaps  Leg  Cramps: Tums MagGel  Nausea/Vomiting:  Bonine Dramamine Emetrol Ginger extract Sea bands Meclizine  Nausea medication to take during pregnancy:  Unisom (doxylamine succinate 25 mg tablets) Take one tablet daily at bedtime. If symptoms are not adequately controlled, the dose can be increased to a maximum recommended dose of two tablets daily (1/2 tablet in the morning, 1/2 tablet mid-afternoon and one at bedtime). Vitamin B6 107mtablets. Take one tablet twice a day (up to 200 mg per day).  Skin Rashes: Aveeno products Benadryl cream or 2551mvery 6 hours as needed Calamine Lotion 1% cortisone cream  Yeast infection: Gyne-lotrimin 7 Monistat 7   **If taking multiple medications, please check labels to avoid duplicating the same active ingredients **take medication as directed on the label ** Do not exceed 4000 mg of tylenol in 24 hours **Do not take medications that contain aspirin or ibuprofen       Signs and Symptoms of Labor Labor is your body's natural process of moving your baby, placenta, and umbilical cord out of your uterus. The process of labor usually starts when your baby is full-term, between 37 59d 40 weeks of pregnancy. How will I know when I am close to going into labor? As your body prepares for labor and the birth of your baby, you may notice the following symptoms in the weeks and days before true labor starts:  Having a strong desire to get your home ready to receive your new baby. This is called nesting. Nesting may be a sign that labor is approaching, and it may occur several weeks before birth. Nesting may involve cleaning and organizing your home.  Passing a small amount of thick, bloody mucus out of your vagina (normal bloody show or losing your mucus plug). This may happen more than a week before labor begins, or it might occur right before labor begins as the opening of the cervix starts to widen (dilate). For some women, the entire mucus plug  passes at once. For others, smaller portions of the mucus plug may gradually pass over several days.  Your baby moving (dropping) lower in your pelvis to get into position for birth (lightening). When this happens, you may feel more pressure on your bladder and pelvic bone and less pressure on your ribs. This may make it easier to breathe. It may also cause you to need to urinate more often and have problems with bowel movements.  Having "practice contractions" (Braxton Hicks contractions) that occur at irregular (unevenly spaced) intervals that are more than 10 minutes apart. This is also called false labor. False labor contractions are common after exercise or sexual activity, and they will stop if you change position, rest, or drink fluids. These contractions are usually mild and do not get stronger over time. They may feel like: ? A backache or back pain. ? Mild cramps, similar to menstrual cramps. ? Tightening or pressure in your abdomen. Other early symptoms that labor may be starting soon include:  Nausea or loss of appetite.  Diarrhea.  Having a sudden burst of energy, or feeling very tired.  Mood changes.  Having trouble sleeping. How will I know when labor has begun? Signs that true labor has begun may include:  Having contractions that come at regular (evenly spaced) intervals and increase in intensity. This may feel like more intense tightening or pressure in your abdomen that moves to your back. ? Contractions may also feel like rhythmic pain in your upper thighs or back that comes and goes at regular intervals. ? For first-time mothers, this change in intensity of contractions often occurs at a more gradual pace. ? Women who have given birth before may notice a more rapid progression of contraction changes.  Having a feeling of pressure in the vaginal area.  Your water breaking (rupture of membranes). This is when the sac of fluid that surrounds your baby breaks. When this  happens, you will notice fluid leaking from your vagina. This may be clear or blood-tinged. Labor usually starts within 24 hours of your water breaking, but it may take longer to begin. ? Some women notice this as a gush of fluid. ? Others notice that their underwear repeatedly becomes damp. Follow these instructions at home:   When labor starts, or if your water breaks, call your health care provider or nurse care line. Based on your situation, they will determine when you should go in for an exam.  When you are in early labor, you may be able to rest and manage symptoms at home. Some strategies to try at home include: ? Breathing and relaxation techniques. ? Taking a warm bath or shower. ? Listening to music. ? Using a heating pad on the lower back for pain. If you are directed to use heat:  Place a towel between your skin and the heat source.  Leave the heat on for 20-30 minutes.  Remove the heat if your skin turns bright red. This is especially important if you are unable to feel pain, heat, or cold. You may have a greater risk of getting burned. Get help right away if:  You have painful, regular contractions that  are 5 minutes apart or less.  Labor starts before you are [redacted] weeks along in your pregnancy.  You have a fever.  You have a headache that does not go away.  You have bright red blood coming from your vagina.  You do not feel your baby moving.  You have a sudden onset of: ? Severe headache with vision problems. ? Nausea, vomiting, or diarrhea. ? Chest pain or shortness of breath. These symptoms may be an emergency. If your health care provider recommends that you go to the hospital or birth center where you plan to deliver, do not drive yourself. Have someone else drive you, or call emergency services (911 in the [place] Summary  Labor is your body's natural process of moving your baby, placenta, and umbilical cord out of your uterus.  The process of labor usually  starts when your baby is full-term, between 38 and 40 weeks of pregnancy.  When labor starts, or if your water breaks, call your health care provider or nurse care line. Based on your situation, they will determine when you should go in for an exam. This information is not intended to replace advice given to you by your health care provider. Make sure you discuss any questions you have with your health care provider. Document Revised: 07/11/2017 Document Reviewed: 03/18/2017 Elsevier Patient Education  2020 [employer].        Vaginal Birth After Cesarean Delivery  Vaginal birth after cesarean delivery (VBAC) is giving birth vaginally after previously delivering a baby through a cesarean section (C-section). A VBAC may be a safe option for you, depending on your health and other factors. It is important to discuss VBAC with your health care provider early in your pregnancy so you can understand the risks, benefits, and options. Having these discussions early will give you time to make your birth plan. Who are the best candidates for VBAC? The best candidates for VBAC are women who:  Have had one or two prior cesarean deliveries, and the incision made during the delivery was horizontal (low transverse).  Do not have a vertical (classical) scar on their uterus.  Have not had a tear in the wall of their uterus (uterine rupture).  Plan to have more pregnancies. A VBAC is also more likely to be successful:  In women who have previously given birth vaginally.  When labor starts by itself (spontaneously) before the due date. What are the benefits of VBAC? The benefits of delivering your baby vaginally instead of by a cesarean delivery include:  A shorter hospital stay.  A faster recovery time.  Less pain.  Avoiding risks associated with major surgery, such as infection and blood clots.  Less blood loss and less need for donated blood (transfusions). What are the risks of  VBAC? The main risk of attempting a VBAC is that it may fail, forcing your health care provider to deliver your baby by a C-section. Other risks are rare and include:  Tearing (rupture) of the scar from a past cesarean delivery.  Other risks associated with vaginal deliveries. If a repeat cesarean delivery is needed, the risks include:  Blood loss.  Infection.  Blood clot.  Damage to surrounding organs.  Removal of the uterus (hysterectomy), if it is damaged.  Placenta problems in future pregnancies. What else should I know about my options? Delivering a baby through a VBAC is similar to having a normal spontaneous vaginal delivery. Therefore, it is safe:  To try with twins.  For  your health care provider to try to turn the baby from a breech position (external cephalic version) during labor.  With epidural analgesia for pain relief. Consider where you would like to deliver your baby. VBAC should be attempted in facilities where an emergency cesarean delivery can be performed. VBAC is not recommended for home births. Any changes in your health or your baby's health during your pregnancy may make it necessary to change your initial decision about VBAC. Your health care provider may recommend that you do not attempt a VBAC if:  Your baby's suspected weight is 8.8 lb (4 kg) or more.  You have preeclampsia. This is a condition that causes high blood pressure along with other symptoms, such as swelling and headaches.  You will have VBAC less than 19 months after your cesarean delivery.  You are past your due date.  You need to have labor started (induced) because your cervix is not ready for labor (unfavorable). Where to find more information  American Pregnancy Association: americanpregnancy.org  [employer] of Obstetricians and Gynecologists: acog.org Summary  Vaginal birth after cesarean delivery (VBAC) is giving birth vaginally after previously delivering a baby  through a cesarean section (C-section). A VBAC may be a safe option for you, depending on your health and other factors.  Discuss VBAC with your health care provider early in your pregnancy so you can understand the risks, benefits, options, and have plenty of time to make your birth plan.  The main risk of attempting a VBAC is that it may fail, forcing your health care provider to deliver your baby by a C-section. Other risks are rare. This information is not intended to replace advice given to you by your health care provider. Make sure you discuss any questions you have with your health care provider. Document Revised: 02/06/2019 Document Reviewed: 01/18/2017 Elsevier Patient Education  [place].          Abdominal Pain During Pregnancy  Abdominal pain is common during pregnancy, and has many possible causes. Some causes are more serious than others, and sometimes the cause is not known. Abdominal pain can be a sign that labor is starting. It can also be caused by normal growth and stretching of muscles and ligaments during pregnancy. Always tell your health care provider if you have any abdominal pain. Follow these instructions at home:  Do not have sex or put anything in your vagina until your pain goes away completely.  Get plenty of rest until your pain improves.  Drink enough fluid to keep your urine pale yellow.  Take over-the-counter and prescription medicines only as told by your health care provider.  Keep all follow-up visits as told by your health care provider. This is important. Contact a health care provider if:  Your pain continues or gets worse after resting.  You have lower abdominal pain that: ? Comes and goes at regular intervals. ? Spreads to your back. ? Is similar to menstrual cramps.  You have pain or burning when you urinate. Get help right away if:  You have a fever or chills.  You have vaginal bleeding.  You are leaking fluid from  your vagina.  You are passing tissue from your vagina.  You have vomiting or diarrhea that lasts for more than 24 hours.  Your baby is moving less than usual.  You feel very weak or faint.  You have shortness of breath.  You develop severe pain in your upper abdomen. Summary  Abdominal pain is  common during pregnancy, and has many possible causes.  If you experience abdominal pain during pregnancy, tell your health care provider right away.  Follow your health care provider's home care instructions and keep all follow-up visits as directed. This information is not intended to replace advice given to you by your health care provider. Make sure you discuss any questions you have with your health care provider. Document Revised: 01/29/2019 Document Reviewed: 01/13/2017 Elsevier Patient Education  2020 [employer].     BRAINSTORMING  Develop a Plan Goals: . Provide a way to start conversation about your new life with a baby . Assist parents in recognizing and using resources within their reach . Help pave the way before birth for an easier period of transition afterwards.  Make a list of the following information to keep in a central location: . Full name of Mom and Partner: _____________________________________________ . 40 full name and Date of Birth: ___________________________________________ . Home Address: ___________________________________________________________ ________________________________________________________________________ . Home Phone: ____________________________________________________________ . Parents' cell numbers: _____________________________________________________ ________________________________________________________________________ . Name and contact info for OB: ______________________________________________ . Name and contact info for Pediatrician:________________________________________ . Contact info for Lactation Consultants:  ________________________________________  REST and SLEEP *You each need at least 4-5 hours of uninterrupted sleep every day. Write specific names and contact information.* . How are you going to rest in the postpartum period? While partner's home? When partner returns to work? When you both return to work? Marland Kitchen Where will your baby sleep? Marland Kitchen Who is available to help during the day? Evening? Night? . Who could move in for a period to help support you? Marland Kitchen What are some ideas to help you get enough sleep? __________________________________________________________________________________________________________________________________________________________________________________________________________________________________________ NUTRITIOUS FOOD AND DRINK *Plan for meals before your baby is born so you can have healthy food to eat during the immediate postpartum period.* . Who will look after breakfast? Lunch? Dinner? List names and contact information. Brainstorm quick, healthy ideas for each meal. . What can you do before baby is born to prepare meals for the postpartum period? . How can others help you with meals? Marland Kitchen Which grocery stores provide online shopping and delivery? Marland Kitchen Which restaurants offer take-out or delivery options? ______________________________________________________________________________________________________________________________________________________________________________________________________________________________________________________________________________________________________________________________________________________________________________________________________  CARE FOR MOM *It's important that mom is cared for and pampered in the postpartum period. Remember, the most important ways new mothers need care are: sleep, nutrition, gentle exercise, and time off.* . Who can come take care of mom during this period? Make a list of people with their  contact information. . List some activities that make you feel cared for, rested, and energized? Who can make sure you have opportunities to do these things? . Does mom have a space of her very own within your home that's just for her? Make a "[facility]" where she can be comfortable, rest, and renew herself daily. ______________________________________________________________________________________________________________________________________________________________________________________________________________________________________________________________________________________________________________________________________________________________________________________________________    CARE FOR AND FEEDING BABY *Knowledgeable and encouraging people will offer the best support with regard to feeding your baby.* . Educate yourself and choose the best feeding option for your baby. . Make a list of people who will guide, support, and be a resource for you as your care for and feed your baby. (Friends that have breastfed or are currently breastfeeding, lactation consultants, breastfeeding support groups, etc.) . Consider a postpartum doula. (These websites can give you information: dona.org & BuyingShow.es) . Seek out local breastfeeding resources like the breastfeeding support group at [employer] or [employer]. ______________________________________________________________________________________________________________________________________________________________________________________________________________________________________________________________________________________________________________________________________________________________________________________________________  Verner Chol [employer]ANDS . Who can help with a  thorough cleaning before baby is born? . Make a list of people who will help with housekeeping and chores, like laundry, light cleaning, dishes, bathrooms,  etc. . Who can run some errands for you? Marland Kitchen What can you do to make sure you are stocked with basic supplies before baby is born? . Who is going to do the shopping? ______________________________________________________________________________________________________________________________________________________________________________________________________________________________________________________________________________________________________________________________________________________________________________________________________     Family Adjustment *Nurture yourselves.it helps parents be more loving and allows for better bonding with their child.* . What sorts of things do you and partner enjoy doing together? Which activities help you to connect and strengthen your relationship? Make a list of those things. Make a list of people whom you trust to care for your baby so you can have some time together as a couple. . What types of things help partner feel connected to Mom? Make a list. . What needs will partner have in order to bond with baby? . Other children? Who will care for them when you go into labor and while you are in the hospital? . Think about what the needs of your older children might be. Who can help you meet those needs? In what ways are you helping them prepare for bringing baby home? List some specific strategies you have for family adjustment. _______________________________________________________________________________________________________________________________________________________________________________________________________________________________________________________________________________________________________________________________________________  SUPPORT *Someone who can empathize with experiences normalizes your problems and makes them more bearable.* . Make a list of other friends, neighbors, and/or co-workers you know with  infants (and small children, if applicable) with whom you can connect. . Make a list of local or online support groups, mom groups, etc. in which you can be involved. ______________________________________________________________________________________________________________________________________________________________________________________________________________________________________________________________________________________________________________________________________________________________________________________________________  Childcare Plans . Investigate and plan for childcare if mom is returning to work. . Talk about mom's concerns about her transition back to work. . Talk about partner's concerns regarding this transition.  Mental Health *Your mental health is one of the highest priorities for a pregnant or postpartum mom.* . 1 in 5 women experience anxiety and/or depression from the time of conception through the first year after birth. . Postpartum Mood Disorders are the #1 complication of pregnancy and childbirth and the suffering experienced by these mothers is not necessary! These illnesses are temporary and respond well to treatment, which often includes self-care, social support, talk therapy, and medication when needed. . Women experiencing anxiety and depression often say things like: "I'm supposed to be happy.why do I feel so sad?", "Why can't I snap out of it?", "I'm having thoughts that scare me." . There is no need to be embarrassed if you are feeling these symptoms: o Overwhelmed, anxious, angry, sad, guilty, irritable, hopeless, exhausted but can't sleep o You are NOT alone. You are NOT to blame. With help, you WILL be well. . Where can I find help? Medical professionals such as your OB, midwife, gynecologist, family practitioner, primary care provider, pediatrician, or mental health providers; [facility] support groups: Feelings After Birth,  Breastfeeding Support Group, Baby and Me Group, and Fit [employer] exercise classes. . You have permission to ask for help. It will confirm your feelings, validate your experiences, share/learn coping strategies, and gain support and encouragement as you heal. You are important! BRAINSTORM . Make a list of local resources, including resources for mom and for partner. . Identify support groups. . Identify people to call late at night - include names and contact info. . Talk with partner about perinatal mood and anxiety disorders. . Talk with your OB, midwife, and doula about baby  blues and about perinatal mood and anxiety disorders. . Talk with your pediatrician about perinatal mood and anxiety disorders.   Support & [employer]   What do you really need?  . Basics . In preparing for a new baby, many expectant parents spend hours shopping for baby clothes, decorating the nursery, and deciding which car seat to buy. Yet most don't think much about what the reality of parenting a newborn will be like, and what they need to make it through that. So, here is the advice of experienced parents. We know you'll read this, and think "they're exaggerating, I don't really need that." Just trust [place] on these, OK? Plan for all of this, and if it turns out you don't need it, come back and teach [place] how you did it!  Satira Anis (Once baby's survival needs are met, make sure you attend to your own survival needs!) . Sleep . An average newborn sleeps 16-18 hours per day, over 6-7 sleep periods, rarely more than three hours at a time. It is normal and healthy for a newborn to wake throughout the night... but really hard on parents!! . Naps. Prioritize sleep above any responsibilities like: cleaning house, visiting friends, running errands, etc.  Sleep whenever baby sleeps. If you can't nap, at least have restful times when baby eats. The more rest you get, the more patient you will be, the more emotionally stable, and  better at solving problems.  . Food . You may not have realized it would be difficult to eat when you have a newborn. Yet, when we talk to . countless new parents, they say things like "it may be 2:00 pm when I realize I haven't had breakfast yet." Or "every time we sit down to dinner, baby needs to eat, and my food gets cold, so I don't bother to eat it." . Finger food. Before your baby is born, stock up with one months' worth of food that: 1) you can eat with one hand while holding a baby, 2) doesn't need to be prepped, 3) is good hot or cold, 4) doesn't spoil when left out for a few hours, and 5) you like to eat. Think about: nuts, dried fruit, Clif bars, pretzels, jerky, gogurt, baby carrots, apples, bananas, crackers, cheez-n-crackers, string cheese, hot pockets or frozen burritos to microwave, garden burgers and breakfast pastries to put in the toaster, yogurt drinks, etc. . Restaurant Menus. Make lists of your favorite restaurants & menu items. When family/friends want to help, you can give specific information without much thought. They can either bring you the food or send gift cards for just the right meals. Rosaura Carpenter Meals.  Take some time to make a few meals to put in the freezer ahead of time.  Easy to freeze meals can be anything such as soup, lasagna, chicken pie, or spaghetti sauce. . Set up a Meal Schedule.  Ask friends and family to sign up to bring you meals during the first few weeks of being home. (It can be passed around at baby showers!) You have no idea how helpful this will be until you are in the throes of parenting.  https://hamilton-woodard.com/ is a great website to check out. . Emotional Support . Know who to call when you're stressed out. Parenting a newborn is very challenging work. There are times when it totally overwhelms your normal coping abilities. EVERY NEW PARENT NEEDS TO HAVE A PLAN FOR WHO TO CALL WHEN THEY JUST CAN'T COPE ANY MORE. (  And it has to be someone other than the  baby's other parent!) Before your baby is born, come up with at least one person you can call for support - write their phone number down and post it on the refrigerator. Marland Kitchen Anxiety & Sadness. Baby blues are normal after pregnancy; however, there are more severe types of anxiety & sadness which can occur and should not be ignored.  They are always treatable, but you have to take the first step by reaching out for help. [facility] offers a "Mom Talk" group which meets every Tuesday from 10 am - 11 am.  This group is for new moms who need support and connection after their babies are born.  Call [phone].  Marland Kitchen Really, Really Helpful (Plan for them! Make sure these happen often!!) . Physical Support with Taking Care of Yourselves . Asking friends and family. Before your baby is born, set up a schedule of people who can come and visit and help out (or ask a friend to schedule for you). Any time someone says "let me know what I can do to help," sign them up for a day. When they get there, their job is not to take care of the baby (that's your job and your joy). Their job is to take care of you!  . Postpartum doulas. If you don't have anyone you can call on for support, look into postpartum doulas:  professionals at helping parents with caring for baby, caring for themselves, getting breastfeeding started, and helping with household tasks. www.padanc.org is a helpful website for learning about doulas in our area. . Peer Support / Parent Groups . Why: One of the greatest ideas for new parents is to be around other new parents. Parent groups give you a chance to share and listen to others who are going through the same season of life, get a sense of what is normal infant development by watching several babies learn and grow, share your stories of triumph and struggles with empathetic ears, and forgive your own mistakes when you realize all parents are learning by trial and error. . Where to find: There are  many places you can meet other new parents throughout our community.  [facility] offers the following classes for new moms and their little ones:  Baby and Me (Birth to [place]) and Breastfeeding Support Group. Go to www.conehealthybaby.com or call [phone] for more information. . Time for your Relationship . It's easy to get so caught up in meeting baby's immediate needs that it's hard to find time to connect with your partner, and meet the needs of your relationship. It's also easy to forget what "quality time with your partner" actually looks like. If you take your baby on a date, you'd be amazed how much of your couple time is spent feeding the baby, diapering the baby, admiring the baby, and talking about the baby. . Dating: Try to take time for just the two of you. Babysitter tip: Sometimes when moms are breastfeeding a newborn, they find it hard to figure out how to schedule outings around baby's unpredictable feeding schedules. Have the babysitter come for a three hour period. When she comes over, if baby has just eaten, you can leave right away, and come back in two hours. If baby hasn't fed recently, you start the date at home. Once baby gets hungry and gets a good feeding in, you can head out for the rest of your date time. . Date Nights at Home:  If you can't get out, at least set aside one evening a week to prioritize your relationship: whenever baby dozes off or doesn't have any immediate needs, spend a little time focusing on each other. . Potential conflicts: The main relationship conflicts that come up for new parents are: issues related to sexuality, financial stresses, a feeling of an unfair division of household tasks, and conflicts in parenting styles. The more you can work on these issues before baby arrives, the better!  Clint Guy and Frills (Don't forget these. and don't feel guilty for indulging in them!) . Everyone has something in life that is a fun little treat that they do  just for themselves. It may be: reading the morning paper, or going for a daily jog, or having coffee with a friend once a week, or going to a movie on Friday nights, or fine chocolates, or bubble baths, or curling up with a good book. . Unless you do fun things for yourself every now and then, it's hard to have the energy for fun with your baby. Whatever your "special" treats are, make sure you find a way to continue to indulge in them after your baby is born. These special moments can recharge you, and allow you to return to baby with a new joy   PERINATAL MOOD DISORDERS: [place]   Emergency and Crisis Resources:  If you are an imminent risk to self or others, are experiencing intense personal distress, and/or have noticed significant changes in activities of daily living, call:  . 911 . [facility]: [phone] . Mobile Crisis: [phone] . National Suicide Hotline: 570-534-7869 Or visit the following crisis centers: . Local Emergency Departments . [facility]: [address], [place]. Hours: 8:30AM-5PM. Insurance Accepted: Medicaid, Medicare, and Uninsured.  Marland Kitchen [facility]  [address] [place] Mon-Friday 8am-3pm  [phone]                                                                                    Non-Crisis Resources: To identify specific providers that are covered by your insurance, contact your insurance company or local agencies: [place]: [phone] [facility] [facility] [place]: [phone] [place]: [phone] Postpartum Support International- [place] 1-([phone])[phone]                                                      Outpatient therapy and medication management providers:  [facility] [phone] Hours: 9AM-5PM  Insurance Accepted: Alben Spittle, Lorella Nimrod, Freddrick March, [place], Medicare   [facility]) [phone] Hours: 8AM-5PM  nsurance Accepted: All insurances EXCEPT AARP, [place], [place], and [place]: [phone]             Hours: 8AM-8PM Insurance Accepted: Cristal Ford, Freddrick March, [place], Medicare, [phone] [facility]: [phone] Hours: 8:30AM-7PM Insurance Accepted: Cristal Ford, Medicaid, Medicare, [place] [facility]:  [phone]  Hours:9AM-5PM Insurance Accepted:  Holland Falling, Lorella Nimrod, [employer], [place], [place] [phone] Hours: 9AM-5:30PM Insurance Accepted: Alben Spittle, Charlotte Crumb, and [place], Medicare, [facility]:  [phone] Hours: 9am-5pm Insurance Accepted: BCBS; they do not accept Medicaid/Medicare [place]: [phone] Hours: 9am-9pm Insurance Accepted: All major insurance including Medicaid and Medicare [facility]: [phone] Hours: 9AM-5:30PM Insurance Accepted: All insurances EXCEPT Medicaid and Medicare. [facility]: [place]: [phone] [place]:  [place] (support for children in the NICU and/or with special needs), [place] [employer]: [phone]                                                                                     Online Resources: Postpartum Support International: http://jones-berg.com/  800-944-4PPD 2Moms Supporting Moms:  www.momssupportingmoms.net                                                                                                  /Emotional [employer] and Websites Here are a few free apps meant to help you to help yourself.  To find, try searching on the internet to see if the app is offered on Apple/Android devices. If your first choice doesn't come up on your device, the good news is that there are many choices! Play around with different apps to see which ones are helpful to you.    Calm This is an app meant to help increase calm feelings. Includes info, strategies, and tools for tracking your feelings.      Calm Harm  This app is meant to help with self-harm. Provides many 5-minute or 15-min coping strategies for doing instead of hurting yourself.       [place] is a problem-solving tool to help deal with emotions and cope with stress you encounter wherever you are.  MindShift This app can help people cope with anxiety. Rather than trying to avoid anxiety, you can make an important shift and face it.      MY3  MY3 features a support system, safety plan and resources with the goal of offering a tool to use in a time of need.       My Life My Voice  This mood journal offers a simple solution for tracking your thoughts, feelings and moods. Animated emoticons can help identify your mood.       Relax Melodies Designed to help with sleep, on this app you can mix sounds and meditations for relaxation.      Smiling Mind Smiling Mind is meditation made easy: it's a simple tool that helps put a smile on your mind.        Stop, Breathe & Think  A friendly, simple guide for people through meditations for mindfulness and compassion.  Stop, Breathe and Think Kids Enter your current feelings and choose a "mission" to help you cope. Offers videos for certain moods instead of just sound recordings.       Team Orange The goal of this tool is to help teens change how they think, act, and react. This app helps you focus on your own good feelings and  experiences.      The Ash[place] Box The [place] Box (VHB) contains simple tools to help patients with coping, relaxation, distraction, and positive thinking.

## 2020-03-10 NOTE — Progress Notes (Signed)
Subjective:  Priscilla Delgado is a 39 y.o. A8T4196 at [redacted]w[redacted]d being seen today for ongoing prenatal care.  She is currently monitored for the following issues for this high-risk pregnancy and has History of VBAC; Advanced maternal age in multigravida; Maternal obesity affecting pregnancy, antepartum; Supervision of high risk pregnancy, antepartum; Language barrier; BMI 40.0-44.9, adult (HCC); History of ulcer disease; Short interval between pregnancies affecting pregnancy, antepartum; Late prenatal care; and Subclinical hypothyroidism on their problem list.  Patient reports pelvic pain x 2weeks, patient reports taking Tylenol PRN (does not work), pt denies prescriptions for other medications, pt reports wearing pregnancy support belt daily, but states it does not help..  Contractions: Irregular. Vag. Bleeding: None.  Movement: Present. Denies leaking of fluid.   Pt reports NKDA. Pt reports taking Tylenol, bASA, PNVs.  The following portions of the patient's history were reviewed and updated as appropriate: allergies, current medications, past family history, past medical history, past social history, past surgical history and problem list. Problem list updated.  Objective:   Vitals:   03/10/20 1053  BP: 105/71  Pulse: 91  Weight: 290 lb 12.8 oz (131.9 kg)    Fetal Status: Fetal Heart Rate (bpm): 163   Movement: Present     General:  Alert, oriented and cooperative. Patient is in no acute distress.  Skin: Skin is warm and dry. No rash noted.   Cardiovascular: Normal heart rate noted  Respiratory: Normal respiratory effort, no problems with respiration noted  Abdomen: Soft, gravid, appropriate for gestational age. Pain/Pressure: Present     Pelvic: Vag. Bleeding: None     Cervical exam deferred        Extremities: Normal range of motion.  Edema: Trace  Mental Status: Normal mood and affect. Normal behavior. Normal judgment and thought content.   Urinalysis:      Assessment and Plan:   Pregnancy: G5P4004 at [redacted]w[redacted]d  1. Supervision of high risk pregnancy, antepartum - Culture, beta strep (group b only) - Cervicovaginal ancillary only( [place]) - discussed contraception, pt declines to sign papers for BTL at this time, will consider LARC  2. Subclinical hypothyroidism -no thyroid labs since March -TSH/Free T4 today  3. BMI 40.0-44.9, adult (HCC) -pt on bASA daily -growth [place] scheduled 03/18/2020, pt aware  4. Language barrier -pt declines translator  5. Multigravida of advanced maternal age in third trimester -pt confirms birthday  59. History of VBAC -First pregnancy with c-section in [place] -Followed by 3 VBAC 2011 and 2016 and 2020 -Already counseled and consented for TOLAC  7. Positive depression screening - Ambulatory referral to Integrated Behavioral Health - Marijean Niemann to bedside today to see patient  8. Pelvic pain - cyclobenzaprine (FLEXERIL) 10 MG tablet; Take 1 tablet (10 mg total) by mouth every 8 (eight) hours as needed for muscle spasms.  Dispense: 30 tablet; Refill: 1 - Urine Culture - referral to pelvic PT, pt will be called in 24-48 hours to schedule  - pt requesting to be out of work, but ineligible for [employer] through employer and brought ADA paperwork today, given to staff for evaluation  Term labor symptoms and general obstetric precautions including but not limited to vaginal bleeding, contractions, leaking of fluid and fetal movement were reviewed in detail with the patient. Please refer to After Visit Summary for other counseling recommendations.  Return in about 1 week (around 03/17/2020) for in-person HOB/APP OK.   Taras Rask, Odie Sera, NP

## 2020-03-11 LAB — CERVICOVAGINAL ANCILLARY ONLY
Chlamydia: NEGATIVE
Comment: NEGATIVE
Comment: NORMAL
Neisseria Gonorrhea: NEGATIVE

## 2020-03-11 LAB — T4, FREE: Free T4: 0.89 ng/dL (ref 0.82–1.77)

## 2020-03-11 LAB — URINE CULTURE: Organism ID, Bacteria: NO GROWTH

## 2020-03-11 LAB — TSH: TSH: 2.99 u[IU]/mL (ref 0.450–4.500)

## 2020-03-13 LAB — CULTURE, BETA STREP (GROUP B ONLY): Strep Gp B Culture: NEGATIVE

## 2020-03-13 NOTE — BH Specialist Note (Signed)
Integrated Behavioral Health via Telemedicine Phone Visit  03/13/2020 Priscilla Delgado [id number]  Number of [place] visits: 2 Session Start time: 11:33  Session End time: 11:52 Total time: 19  Referring Provider: Vernice Jefferson, NP Type of Visit: Phone Patient/Family location: Home [facility] Provider location: [facility] for [place] [facility] [place] [facility]  All persons participating in visit: Patient Priscilla Delgado and Priscilla Delgado    Confirmed patient's address: Yes  Confirmed patient's phone number: Yes  Any changes to demographics: No   Confirmed patient's insurance: Yes  Any changes to patient's insurance: No   Discussed confidentiality: Yes    I connected with Priscilla Delgado by a video enabled telemedicine application and verified that I am speaking with the correct person using two identifiers.     I discussed the limitations of evaluation and management by telemedicine and the availability of in person appointments.  I discussed that the purpose of this visit is to provide behavioral health care while limiting exposure to the novel coronavirus.   Discussed there is a possibility of technology failure and discussed alternative modes of communication if that failure occurs.  I discussed that engaging in this video visit, they consent to the provision of behavioral healthcare and the services will be billed under their insurance.  Patient and/or legal guardian expressed understanding and consented to video visit: Yes   PRESENTING CONCERNS: Patient and/or family reports the following symptoms/concerns: Pt states her primary concern today is worry over paperwork that needs to be sent to her workplace, and financial stress, along with interest in filing for unemployment. Pt has not heard back from agencies referred via [place]; her voicemail is broken and unable to set up [place]. Pt attributes symptoms of anxiety and depression to current  life stress, and agrees to referral to psychiatry.  Duration of problem: Current pregnancy; Severity of problem: severe  STRENGTHS (Protective Factors/Coping Skills): Open to access community resources  GOALS ADDRESSED: Patient will: 1.  Reduce symptoms of: anxiety, depression and stress  2.  Increase knowledge and/or ability of: stress reduction  3.  Demonstrate ability to: Increase healthy adjustment to current life circumstances and Increase adequate support systems for patient/family  INTERVENTIONS: Interventions utilized:  Solution-Focused Strategies Standardized Assessments completed: PHQ9/GAD7 given in past two weeks  ASSESSMENT: Patient currently experiencing Adjustment disorder with mixed anxiety and depressed mood and Psychosocial stress.   Patient may benefit from continued psychoeducation and brief therapeutic interventions regarding coping with symptoms of depression and anxiety, attributed to current life stress .  PLAN: 1. Follow up with behavioral health clinician on: As needed 2. Behavioral recommendations:  -Accept referral to psychiatry -Consider community resources, as discussed. Agency referrals have been sent, and will call at number provided.  -Contact Employment Dietitian for unemployment 3. Referral(s): Integrated Orthoptist (In Clinic) and [employer] Resources:  Finances and utilities  I discussed the assessment and treatment plan with the patient and/or parent/guardian. They were provided an opportunity to ask questions and all were answered. They agreed with the plan and demonstrated an understanding of the instructions.   They were advised to call back or seek an in-person evaluation if the symptoms worsen or if the condition fails to improve as anticipated.  Priscilla Delgado [facility]  Depression screen [facility] 2/9 03/10/2020 01/07/2020 01/07/2020 12/13/2018 11/16/2018  Decreased Interest 3 0 0 0 0  Down, Depressed, Hopeless  - 0 0 0 0  PHQ - 2 Score 3 0 0 0  0  Altered sleeping - 0 0 3 -  Tired, decreased energy 3 2 0 3 -  Change in appetite - 0 0 2 -  Feeling bad or failure about yourself  3 1 0 2 -  Trouble concentrating - 0 0 0 -  Moving slowly or fidgety/restless - 0 0 0 -  Suicidal thoughts - 0 0 0 -  PHQ-9 Score - 3 0 10 -   GAD 7 : Generalized Anxiety Score 03/10/2020 01/07/2020 01/07/2020 12/13/2018  Nervous, Anxious, on Edge 3 0 0 0  Control/stop worrying 3 0 0 0  Worry too much - different things 3 0 0 1  Trouble relaxing 3 0 0 0  Restless 3 0 0 0  Easily annoyed or irritable 3 0 0 0  Afraid - awful might happen 3 0 0 0  Total GAD 7 Score 21 0 0 1

## 2020-03-17 ENCOUNTER — Ambulatory Visit (INDEPENDENT_AMBULATORY_CARE_PROVIDER_SITE_OTHER): Payer: Medicaid Other | Admitting: Clinical

## 2020-03-17 ENCOUNTER — Other Ambulatory Visit: Payer: Self-pay

## 2020-03-17 DIAGNOSIS — Z658 Other specified problems related to psychosocial circumstances: Secondary | ICD-10-CM | POA: Diagnosis not present

## 2020-03-17 DIAGNOSIS — F4323 Adjustment disorder with mixed anxiety and depressed mood: Secondary | ICD-10-CM | POA: Diagnosis not present

## 2020-03-18 ENCOUNTER — Telehealth: Payer: Self-pay | Admitting: Physical Therapy

## 2020-03-18 ENCOUNTER — Ambulatory Visit: Payer: Medicaid Other | Attending: Obstetrics

## 2020-03-18 ENCOUNTER — Other Ambulatory Visit: Payer: Self-pay

## 2020-03-18 ENCOUNTER — Ambulatory Visit: Payer: Medicaid Other | Admitting: Physical Therapy

## 2020-03-18 DIAGNOSIS — O09529 Supervision of elderly multigravida, unspecified trimester: Secondary | ICD-10-CM | POA: Insufficient documentation

## 2020-03-18 DIAGNOSIS — O09893 Supervision of other high risk pregnancies, third trimester: Secondary | ICD-10-CM | POA: Diagnosis not present

## 2020-03-18 DIAGNOSIS — O09523 Supervision of elderly multigravida, third trimester: Secondary | ICD-10-CM

## 2020-03-18 DIAGNOSIS — Z3A38 38 weeks gestation of pregnancy: Secondary | ICD-10-CM | POA: Diagnosis not present

## 2020-03-18 DIAGNOSIS — O99213 Obesity complicating pregnancy, third trimester: Secondary | ICD-10-CM

## 2020-03-18 DIAGNOSIS — O34219 Maternal care for unspecified type scar from previous cesarean delivery: Secondary | ICD-10-CM

## 2020-03-18 DIAGNOSIS — Z362 Encounter for other antenatal screening follow-up: Secondary | ICD-10-CM | POA: Diagnosis not present

## 2020-03-18 NOTE — Telephone Encounter (Signed)
Called patient and had to reschedule appointmant. She forgot abput appointment.  Eulis Foster, PT @5 /25/2021@ 2:26 PM

## 2020-03-21 ENCOUNTER — Encounter: Payer: Medicaid Other | Admitting: Obstetrics and Gynecology

## 2020-03-25 ENCOUNTER — Encounter: Payer: Medicaid Other | Attending: Women's Health | Admitting: Physical Therapy

## 2020-03-25 ENCOUNTER — Encounter: Payer: Self-pay | Admitting: Physical Therapy

## 2020-03-25 ENCOUNTER — Other Ambulatory Visit: Payer: Self-pay

## 2020-03-25 DIAGNOSIS — M545 Low back pain, unspecified: Secondary | ICD-10-CM

## 2020-03-25 DIAGNOSIS — R102 Pelvic and perineal pain: Secondary | ICD-10-CM | POA: Insufficient documentation

## 2020-03-25 NOTE — Therapy (Signed)
[place] at [facility] [address] [place], [place], [address] Phone: [phone]   Fax:  [phone]  Physical Therapy Evaluation/Discharge  Patient Details  Name: Priscilla Delgado MRN: [id number] Date of Birth: Jan 22, 1981 Referring Provider (PT): Clarisa Fling, NP   Encounter Date: 03/25/2020  PT End of Session - 03/25/20 1708    Visit Number  1    Date for PT Re-Evaluation  03/25/20    Authorization Type  Medicaid    PT Start Time  8366    PT Stop Time  1500    PT Time Calculation (min)  45 min    Activity Tolerance  Patient tolerated treatment well;No increased pain    Behavior During Therapy  WFL for tasks assessed/performed       Past Medical History:  Diagnosis Date  . Ulcer of the stomach and intestine     Past Surgical History:  Procedure Laterality Date  . CESAREAN SECTION      There were no vitals filed for this visit.   Subjective Assessment - 03/25/20 1417    Subjective  Pelvic pain started this month. Patient has had contractions. She gets back pain with contractions. Pain is suprapubically.    Currently in Pain?  Yes    Pain Score  9     Pain Location  Suprapubic    Pain Orientation  Mid    Pain Descriptors / Indicators  Aching    Pain Type  Acute pain    Pain Onset  More than a month ago    Pain Frequency  Intermittent    Aggravating Factors   nothing    Pain Relieving Factors  tylenol         [facility] PT Assessment - 03/25/20 0001      Assessment   Medical Diagnosis  R10.2 Pelvic pain    Referring Provider (PT)  Clarisa Fling, NP    Onset Date/Surgical Date  02/23/20    Prior Therapy  none   due date is 6/8     Precautions   Precautions  Other (comment)    Precaution Comments  precautions      Restrictions   Weight Bearing Restrictions  No      Balance Screen   Has the patient fallen in the past 6 months  No    Has the patient had a decrease in activity level because of a fear  of falling?   No    Is the patient reluctant to leave their home because of a fear of falling?   No      Home Film/video editor residence      Prior Function   Level of Independence  Independent      Cognition   Overall Cognitive Status  Within Functional Limits for tasks assessed      Posture/Postural Control   Posture/Postural Control  Postural limitations    Posture Comments  pregnant      ROM / Strength   AROM / PROM / Strength  AROM;PROM;Strength      AROM   Lumbar - Left Side Bend  decreased by 25 %      Strength   Right Hip Flexion  4/5    Left Hip Flexion  4/5      Palpation   SI assessment   left ilium rotated posteriorly      Special Tests    Special Tests  Sacrolliac Tests  Pelvic Compression   Findings  Positive    Side  Left    comment  pain                  Objective measurements completed on examination: See above findings.    Pelvic Floor Special Questions - 03/25/20 0001    Prior Pregnancies  Yes    Number of Pregnancies  4   presently pregnant   Number of Vaginal Deliveries  4    Any difficulty with labor and deliveries  No    Urinary Leakage  No    Fecal incontinence  --   constipation with this pregnancy      [facility] Adult PT Treatment/Exercise - 03/25/20 0001      Self-Care   Self-Care  Other Self-Care Comments    Other Self-Care Comments   using warm compress on the perineal area for elongation of the tissue for perinaeal massage and during birth; education on perineal massage to reduce tearing       Therapeutic Activites    Therapeutic Activities  ADL's;Other Therapeutic Activities    ADL's  laying on side with pillows between knees and under her stomach to reduce strain on her back    Other Therapeutic Activities  education on ways to give birth with squatting, sit and rock her pelvis, stand and  and lean on couter with hip wiggle, quadruped, lay on side with top leg flexed at hip and knee       Manual Therapy   Manual Therapy  Muscle Energy Technique    Muscle Energy Technique  to correct left ilium             PT Education - 03/25/20 1707    Education Details  educated patient on perineal massage, birthing positions, using warm compress on the vaginal area to elongate the tissue; pelvic floor contraction for after birth    Person(s) Educated  Patient    Methods  Explanation;Demonstration;Verbal cues    Comprehension  Verbalized understanding;Returned demonstration          PT Long Term Goals - 03/25/20 1717      PT LONG TERM GOAL #1   Title  education on ways to lay down with pillows to support patient    Time  1    Period  Days    Status  Achieved    Target Date  03/25/20      PT LONG TERM GOAL #2   Title  understand ways  to give birth to reduce her back pain    Time  1    Period  Days    Status  Achieved    Target Date  03/25/20      PT LONG TERM GOAL #3   Title  understand how to perform perineal massage to reduce tearing during birth    Time  1    Period  Days    Status  Achieved    Target Date  03/25/20             Plan - 03/25/20 1709    Clinical Impression Statement  Patient is a 39 year old female who is due on 04/01/2020. Patient is having back pain at level 9/10 when she is having contractions. After muscle energy technique her pelvis was in correct alignment. Patient has tightness in the lumbar. Patient was educated on ways to lay on her side with pillows to support her. Patient was educated on different postures for birthing  to reduce the pain in her back. Patient was educated on how to perform perineal massage to reduce tearing during birth. Patient came in for a 1 time visit due to her due date is next week.    Personal Factors and Comorbidities  Comorbidity 1    Comorbidities  pregnant due ot 04/01/2020    Examination-Activity Limitations  Sit;Sleep;Bed Mobility;Transfers;Stand    Examination-Participation Restrictions  Community  Activity    Stability/Clinical Decision Making  Stable/Uncomplicated    Clinical Decision Making  Low    Rehab Potential  Excellent    PT Frequency  One time visit    PT Duration  Other (comment)   1 time visit   PT Treatment/Interventions  ADLs/Self Care Home Management;Patient/family education;Therapeutic activities    PT Next Visit Plan  1 time visit; discharged to [place] and Agree with Plan of Care  Patient       Patient will benefit from skilled therapeutic intervention in order to improve the following deficits and impairments:  Pain, Decreased activity tolerance, Decreased mobility, Improper body mechanics  Visit Diagnosis: Acute bilateral low back pain without sciatica - Plan: PT plan of care cert/re-cert  Pelvic pain - Plan: PT plan of care cert/re-cert     Problem List Patient Active Problem List   Diagnosis Date Noted  . Subclinical hypothyroidism 11/28/2019  . BMI 40.0-44.9, adult ([place]) 11/26/2019  . Short interval between pregnancies affecting pregnancy, antepartum 11/26/2019  . Late prenatal care 11/26/2019  . History of ulcer disease   . Supervision of high risk pregnancy, antepartum 11/07/2019  . Language barrier 11/07/2019  . Advanced maternal age in multigravida 02/19/2019  . Maternal obesity affecting pregnancy, antepartum 02/19/2019  . History of VBAC 01/30/2015   Earlie Counts, PT 03/25/20 5:22 PM ' [place] at [facility] [address], [place], [place], [address] Phone: [phone]   Fax:  [phone]  Name: Priscilla Delgado MRN: [id number] Date of Birth: [birth year] PHYSICAL THERAPY DISCHARGE SUMMARY  Visits from Start of Care: 1  Current functional level related to goals / functional outcomes: See above.    Remaining deficits: See above.    Education / Equipment: HEP Plan: Patient agrees to discharge.  Patient goals were met. Patient is being  discharged due to meeting the stated rehab goals. Thank you for the referral. Earlie Counts, PT 03/25/20 5:22 PM   ?????

## 2020-03-26 ENCOUNTER — Inpatient Hospital Stay (HOSPITAL_COMMUNITY)
Admission: AD | Admit: 2020-03-26 | Discharge: 2020-03-28 | DRG: 807 | Disposition: A | Discharge status: Home / Self Care | Payer: Medicaid Other | Attending: Obstetrics and Gynecology | Admitting: Obstetrics and Gynecology

## 2020-03-26 ENCOUNTER — Encounter (HOSPITAL_COMMUNITY): Payer: Self-pay | Admitting: Family Medicine

## 2020-03-26 ENCOUNTER — Other Ambulatory Visit: Payer: Self-pay

## 2020-03-26 DIAGNOSIS — O99210 Obesity complicating pregnancy, unspecified trimester: Secondary | ICD-10-CM | POA: Diagnosis present

## 2020-03-26 DIAGNOSIS — E02 Subclinical iodine-deficiency hypothyroidism: Secondary | ICD-10-CM | POA: Diagnosis present

## 2020-03-26 DIAGNOSIS — E669 Obesity, unspecified: Secondary | ICD-10-CM | POA: Diagnosis present

## 2020-03-26 DIAGNOSIS — O99284 Endocrine, nutritional and metabolic diseases complicating childbirth: Secondary | ICD-10-CM | POA: Diagnosis present

## 2020-03-26 DIAGNOSIS — E038 Other specified hypothyroidism: Secondary | ICD-10-CM | POA: Diagnosis present

## 2020-03-26 DIAGNOSIS — O09529 Supervision of elderly multigravida, unspecified trimester: Secondary | ICD-10-CM

## 2020-03-26 DIAGNOSIS — O34219 Maternal care for unspecified type scar from previous cesarean delivery: Secondary | ICD-10-CM | POA: Diagnosis present

## 2020-03-26 DIAGNOSIS — O368330 Maternal care for abnormalities of the fetal heart rate or rhythm, third trimester, not applicable or unspecified: Secondary | ICD-10-CM | POA: Diagnosis not present

## 2020-03-26 DIAGNOSIS — O26893 Other specified pregnancy related conditions, third trimester: Secondary | ICD-10-CM | POA: Diagnosis present

## 2020-03-26 DIAGNOSIS — Z789 Other specified health status: Secondary | ICD-10-CM | POA: Diagnosis present

## 2020-03-26 DIAGNOSIS — O34211 Maternal care for low transverse scar from previous cesarean delivery: Secondary | ICD-10-CM | POA: Diagnosis not present

## 2020-03-26 DIAGNOSIS — O99214 Obesity complicating childbirth: Secondary | ICD-10-CM | POA: Diagnosis present

## 2020-03-26 DIAGNOSIS — O0930 Supervision of pregnancy with insufficient antenatal care, unspecified trimester: Secondary | ICD-10-CM

## 2020-03-26 DIAGNOSIS — Z3A39 39 weeks gestation of pregnancy: Secondary | ICD-10-CM | POA: Diagnosis not present

## 2020-03-26 DIAGNOSIS — O0990 Supervision of high risk pregnancy, unspecified, unspecified trimester: Secondary | ICD-10-CM

## 2020-03-26 DIAGNOSIS — Z98891 History of uterine scar from previous surgery: Secondary | ICD-10-CM

## 2020-03-26 DIAGNOSIS — O368390 Maternal care for abnormalities of the fetal heart rate or rhythm, unspecified trimester, not applicable or unspecified: Secondary | ICD-10-CM | POA: Diagnosis present

## 2020-03-26 DIAGNOSIS — O09899 Supervision of other high risk pregnancies, unspecified trimester: Secondary | ICD-10-CM

## 2020-03-26 DIAGNOSIS — Z20822 Contact with and (suspected) exposure to covid-19: Secondary | ICD-10-CM | POA: Diagnosis present

## 2020-03-26 LAB — CBC
HCT: 37.9 % (ref 36.0–46.0)
Hemoglobin: 12.9 g/dL (ref 12.0–15.0)
MCH: 31.5 pg (ref 26.0–34.0)
MCHC: 34 g/dL (ref 30.0–36.0)
MCV: 92.7 fL (ref 80.0–100.0)
Platelets: 158 10*3/uL (ref 150–400)
RBC: 4.09 MIL/uL (ref 3.87–5.11)
RDW: 13.2 % (ref 11.5–15.5)
WBC: 8.6 10*3/uL (ref 4.0–10.5)
nRBC: 0 % (ref 0.0–0.2)

## 2020-03-26 MED ORDER — FENTANYL CITRATE (PF) 100 MCG/2ML IJ SOLN
50.0000 ug | INTRAMUSCULAR | Status: DC | PRN
  Administered 2020-03-27: 100 ug via INTRAVENOUS
  Filled 2020-03-26: qty 2

## 2020-03-26 MED ORDER — OXYTOCIN-SODIUM CHLORIDE 30-0.9 UT/500ML-% IV SOLN: 2.5000 [IU]/h | INTRAVENOUS | Status: DC

## 2020-03-26 MED ORDER — LIDOCAINE HCL (PF) 1 % IJ SOLN: 30.0000 mL | INTRAMUSCULAR | Status: DC | PRN

## 2020-03-26 MED ORDER — SOD CITRATE-CITRIC ACID 500-334 MG/5ML PO SOLN: 30.0000 mL | ORAL | Status: DC | PRN

## 2020-03-26 MED ORDER — ONDANSETRON HCL 4 MG/2ML IJ SOLN: 4.0000 mg | Freq: Four times a day (QID) | INTRAMUSCULAR | Status: DC | PRN

## 2020-03-26 MED ORDER — TERBUTALINE SULFATE 1 MG/ML IJ SOLN: 0.2500 mg | Freq: Once | INTRAMUSCULAR | Status: DC | PRN

## 2020-03-26 MED ORDER — LACTATED RINGERS IV SOLN
500.0000 mL | INTRAVENOUS | Status: DC | PRN
  Administered 2020-03-26: 1000 mL via INTRAVENOUS
  Administered 2020-03-27 (×2): 500 mL via INTRAVENOUS

## 2020-03-26 MED ORDER — OXYTOCIN BOLUS FROM INFUSION
500.0000 mL | Freq: Once | INTRAVENOUS | Status: AC
  Administered 2020-03-27: 999 mL/h via INTRAVENOUS

## 2020-03-26 MED ORDER — LACTATED RINGERS IV SOLN: INTRAVENOUS | Status: DC

## 2020-03-26 MED ORDER — ACETAMINOPHEN 325 MG PO TABS: 650.0000 mg | ORAL_TABLET | ORAL | Status: DC | PRN

## 2020-03-26 MED ORDER — OXYTOCIN-SODIUM CHLORIDE 30-0.9 UT/500ML-% IV SOLN
1.0000 m[IU]/min | INTRAVENOUS | Status: DC
  Administered 2020-03-27: 2 m[IU]/min via INTRAVENOUS
  Filled 2020-03-26: qty 500

## 2020-03-26 NOTE — MAU Note (Signed)
Covid swab obtained without difficulty and pt tol ok. No symptoms

## 2020-03-26 NOTE — MAU Note (Signed)
Leaking fld since 1400. Ctxs for a month but stronger today. Spouse says she is very tired and past 9 months now.

## 2020-03-27 ENCOUNTER — Encounter (HOSPITAL_COMMUNITY): Payer: Self-pay | Admitting: Obstetrics and Gynecology

## 2020-03-27 DIAGNOSIS — Z3A39 39 weeks gestation of pregnancy: Secondary | ICD-10-CM

## 2020-03-27 DIAGNOSIS — O34211 Maternal care for low transverse scar from previous cesarean delivery: Secondary | ICD-10-CM

## 2020-03-27 DIAGNOSIS — O368330 Maternal care for abnormalities of the fetal heart rate or rhythm, third trimester, not applicable or unspecified: Secondary | ICD-10-CM

## 2020-03-27 LAB — TYPE AND SCREEN
ABO/RH(D): O POS
Antibody Screen: NEGATIVE

## 2020-03-27 LAB — RPR: RPR Ser Ql: NONREACTIVE

## 2020-03-27 LAB — SARS CORONAVIRUS 2 BY RT PCR (HOSPITAL ORDER, PERFORMED IN ~~LOC~~ HOSPITAL LAB): SARS Coronavirus 2: NEGATIVE

## 2020-03-27 MED ORDER — COCONUT OIL OIL: 1.0000 "application " | TOPICAL_OIL | Status: DC | PRN

## 2020-03-27 MED ORDER — DIPHENHYDRAMINE HCL 25 MG PO CAPS: 25.0000 mg | ORAL_CAPSULE | Freq: Four times a day (QID) | ORAL | Status: DC | PRN

## 2020-03-27 MED ORDER — DIBUCAINE (PERIANAL) 1 % EX OINT: 1.0000 "application " | TOPICAL_OINTMENT | CUTANEOUS | Status: DC | PRN

## 2020-03-27 MED ORDER — BENZOCAINE-MENTHOL 20-0.5 % EX AERO: 1.0000 "application " | INHALATION_SPRAY | CUTANEOUS | Status: DC | PRN

## 2020-03-27 MED ORDER — ONDANSETRON HCL 4 MG PO TABS: 4.0000 mg | ORAL_TABLET | ORAL | Status: DC | PRN

## 2020-03-27 MED ORDER — OXYCODONE HCL 5 MG PO TABS
5.0000 mg | ORAL_TABLET | ORAL | Status: DC | PRN
  Administered 2020-03-27: 5 mg via ORAL
  Filled 2020-03-27: qty 1

## 2020-03-27 MED ORDER — OXYCODONE HCL 5 MG PO TABS: 10.0000 mg | ORAL_TABLET | ORAL | Status: DC | PRN

## 2020-03-27 MED ORDER — METHYLERGONOVINE MALEATE 0.2 MG/ML IJ SOLN: 0.2000 mg | Freq: Once | INTRAMUSCULAR | Status: DC

## 2020-03-27 MED ORDER — TRANEXAMIC ACID-NACL 1000-0.7 MG/100ML-% IV SOLN: 1000.0000 mg | INTRAVENOUS | Status: AC

## 2020-03-27 MED ORDER — SIMETHICONE 80 MG PO CHEW: 80.0000 mg | CHEWABLE_TABLET | ORAL | Status: DC | PRN

## 2020-03-27 MED ORDER — IBUPROFEN 600 MG PO TABS
600.0000 mg | ORAL_TABLET | Freq: Four times a day (QID) | ORAL | Status: DC
  Administered 2020-03-27 – 2020-03-28 (×4): 600 mg via ORAL
  Filled 2020-03-27 (×5): qty 1

## 2020-03-27 MED ORDER — METHYLERGONOVINE MALEATE 0.2 MG/ML IJ SOLN
INTRAMUSCULAR | Status: AC
  Filled 2020-03-27: qty 1

## 2020-03-27 MED ORDER — WITCH HAZEL-GLYCERIN EX PADS: 1.0000 "application " | MEDICATED_PAD | CUTANEOUS | Status: DC | PRN

## 2020-03-27 MED ORDER — TRANEXAMIC ACID-NACL 1000-0.7 MG/100ML-% IV SOLN
INTRAVENOUS | Status: AC
  Administered 2020-03-27: 1000 mg
  Filled 2020-03-27: qty 100

## 2020-03-27 MED ORDER — ONDANSETRON HCL 4 MG/2ML IJ SOLN: 4.0000 mg | INTRAMUSCULAR | Status: DC | PRN

## 2020-03-27 MED ORDER — MAGNESIUM HYDROXIDE 400 MG/5ML PO SUSP: 30.0000 mL | ORAL | Status: DC | PRN

## 2020-03-27 MED ORDER — SENNOSIDES-DOCUSATE SODIUM 8.6-50 MG PO TABS
2.0000 | ORAL_TABLET | ORAL | Status: DC
  Administered 2020-03-27: 2 via ORAL
  Filled 2020-03-27: qty 2

## 2020-03-27 MED ORDER — PRENATAL MULTIVITAMIN CH
1.0000 | ORAL_TABLET | Freq: Every day | ORAL | Status: DC
  Administered 2020-03-27: 1 via ORAL
  Filled 2020-03-27: qty 1

## 2020-03-27 MED ORDER — ACETAMINOPHEN 325 MG PO TABS: 650.0000 mg | ORAL_TABLET | ORAL | Status: DC | PRN

## 2020-03-27 NOTE — H&P (Signed)
LABOR AND DELIVERY ADMISSION HISTORY AND PHYSICAL NOTE  Priscilla Delgado is a 39 y.o. female G79P4004 with IUP at 64w2dby LMP c/w 17wk UKoreapresenting for IOL for non-reactive NST at term.   Presented to MAU for labor check. Did not make change, however NST was non-reactive and she was admitted for IOL.  She reports positive fetal movement. She denies leakage of fluid, vaginal bleeding. Some contractions.  She plans on breast and bottle feeding. Her contraception plan is: LARC, undecided on exact method.  [facility]  [facility] , CWD, normal anatomy, cephalic presentation, anterior placenta, 35%ile, EFW [id number] Pregnancy complications:  - hx of CS>VBAC x3 - AMA - language barrier - subclinical hypothyroidism  Past Medical History: Past Medical History:  Diagnosis Date  . Ulcer of the stomach and intestine     Past Surgical History: Past Surgical History:  Procedure Laterality Date  . CESAREAN SECTION      Obstetrical History: OB History    Gravida  5   Para  4   Term  4   Preterm  0   AB  0   Living  4     SAB  0   TAB  0   Ectopic  0   Multiple  0   Live Births  4        Obstetric Comments  C/s for breech- leg was coming down        Social History: Social History   Socioeconomic History  . Marital status: Married    Spouse name: Not on file  . Number of children: Not on file  . Years of education: Not on file  . Highest education level: Not on file  Occupational History  . Not on file  Tobacco Use  . Smoking status: Never Smoker  . Smokeless tobacco: Never Used  Substance and Sexual Activity  . Alcohol use: No  . Drug use: No  . Sexual activity: Not Currently    Birth control/protection: None  Other Topics Concern  . Not on file  Social History Narrative  . Not on file   Social Determinants of Health   Financial Resource Strain:   . Difficulty of Paying Living Expenses:   Food Insecurity: No Food  Insecurity  . Worried About RCharity fundraiserin the Last Year: Never true  . Ran Out of Food in the Last Year: Never true  Transportation Needs: No Transportation Needs  . Lack of Transportation (Medical): No  . Lack of Transportation (Non-Medical): No  Physical Activity:   . Days of Exercise per Week:   . Minutes of Exercise per Session:   Stress:   . Feeling of Stress :   Social Connections:   . Frequency of Communication with Friends and Family:   . Frequency of Social Gatherings with Friends and Family:   . Attends Religious Services:   . Active Member of Clubs or Organizations:   . Attends CArchivistMeetings:   .Marland KitchenMarital Status:     Family History: No family history on file.  Allergies: No Known Allergies  Medications Prior to Admission  Medication Sig Dispense Refill Last Dose  . acetaminophen (TYLENOL) 500 MG tablet Take 500 mg by mouth every 6 (six) hours as needed.   03/26/2020 at Unknown time  . aspirin EC 81 MG tablet Take 1 tablet (81 mg total) by mouth daily. 60 tablet 2 03/26/2020 at Unknown time  . cyclobenzaprine (FLEXERIL) 10  MG tablet Take 1 tablet (10 mg total) by mouth every 8 (eight) hours as needed for muscle spasms. 30 tablet 1 03/26/2020 at Unknown time  . Prenatal Vit-Fe Fumarate-FA (PRENATAL COMPLETE) 14-0.4 MG TABS Take 1 tablet by mouth daily. 30 each 2 03/26/2020 at Unknown time  . Blood Pressure Monitoring (BLOOD PRESSURE KIT) DEVI 1 Device by Does not apply route daily. Large Size Cuff  ICD 10: Z34.00 1 each 0   . Elastic Bandages & Supports (COMFORT FIT MATERNITY SUPP LG) MISC 1 Units by Does not apply route daily. 1 each 0      Review of Systems  All systems reviewed and negative except as stated in HPI  Physical Exam Blood pressure 127/63, pulse 86, temperature 98.2 F (36.8 C), resp. rate 18, height _0  (1.727 m), weight 132.5 kg, last menstrual period 06/26/2019, SpO2 100 %, currently breastfeeding. General appearance: alert,  oriented, NAD Lungs: normal respiratory effort Heart: regular rate Abdomen: soft, non-tender; gravid, leopolds 3300g Extremities: No calf swelling or tenderness Presentation: cephalic by RN SVE  Fetal monitoringBaseline: 140 bpm, Variability: Good {> 6 bpm), Accelerations: absent and Decelerations: Absent Uterine activity: poor tracing  Dilation: 4.5 Effacement (%): 60 Station: -3 Exam by:: Frances Maywood RN  Prenatal labs: ABO, Rh: --/--/PENDING (06/02 2319) Antibody: PENDING (06/02 2319) Rubella: 21.50 (02/01 1001) RPR: Non Reactive (04/05 0856)  HBsAg: Negative (02/01 1001)  HIV: Non Reactive (04/05 0856)  GC/Chlamydia: neg/neg 03/10/2020  GBS: Negative/-- (05/17 1153)  2-hr GTT: normal 01/28/2020 (09,32,67) Genetic screening:  Low risk Panorama Anatomy US: normal  Prenatal Transfer Tool  Maternal Diabetes: No Genetic Screening: Normal Maternal Ultrasounds/Referrals: Normal Fetal Ultrasounds or other Referrals:  None Maternal Substance Abuse:  No Significant Maternal Medications:  None Significant Maternal Lab Results: Group B Strep negative  Results for orders placed or performed during the hospital encounter of 03/26/20 (from the past 24 hour(s))  Type and screen [place]   Collection Time: 03/26/20 11:19 PM  Result Value Ref Range   ABO/RH(D) PENDING    Antibody Screen PENDING    Sample Expiration      03/29/2020,2359 Performed at [facility], [address], [place], [place] [address]   CBC   Collection Time: 03/26/20 11:41 PM  Result Value Ref Range   WBC 8.6 4.0 - 10.5 K/uL   RBC 4.09 3.87 - 5.11 MIL/uL   Hemoglobin 12.9 12.0 - 15.0 g/dL   HCT 37.9 36.0 - 46.0 %   MCV 92.7 80.0 - 100.0 fL   MCH 31.5 26.0 - 34.0 pg   MCHC 34.0 30.0 - 36.0 g/dL   RDW 13.2 11.5 - 15.5 %   Platelets 158 150 - 400 K/uL   nRBC 0.0 0.0 - 0.2 %    Patient Active Problem List   Diagnosis Date Noted  . Non-reassuring electronic fetal monitoring tracing  03/26/2020  . Subclinical hypothyroidism 11/28/2019  . BMI 40.0-44.9, adult ([place]) 11/26/2019  . Short interval between pregnancies affecting pregnancy, antepartum 11/26/2019  . Late prenatal care 11/26/2019  . History of ulcer disease   . Supervision of high risk pregnancy, antepartum 11/07/2019  . Language barrier 11/07/2019  . Advanced maternal age in multigravida 02/19/2019  . Maternal obesity affecting pregnancy, antepartum 02/19/2019  . History of VBAC 01/30/2015    Assessment: Priscilla Delgado is a 39 y.o. [id number] at 48w2dhere for IOL for non-reactive NST at term.   #Labor: start IOL with pitocin 2x2. #Pain: IV pain  meds PRN, epidural upon request #FWB: Cat I for normal baseline w moderate variability, but no accels since admission #GBS/ID: negative #COVID: swab pending #MOF: Breast and bottle #MOC: LARC, needs further discussion of exact method #Circ: n/a  #TOLAC: per chart initial cesarean for breech at term. Followed by VBAC x3, consent signed 01/07/2020  Annice Needy [facility] 03/27/2020, 12:06 AM

## 2020-03-27 NOTE — Discharge Summary (Signed)
Postpartum Discharge Summary  Date of Service updated 03/28/20     Patient Name: Priscilla Delgado DOB: 12/09/1980 MRN: [id number]  Date of admission: 03/26/2020 Delivery date:03/27/2020  Delivering provider: Clarnce Flock  Date of discharge: 03/28/2020  Admitting diagnosis: Non-reassuring electronic fetal monitoring tracing [O36.8390] Intrauterine pregnancy: [redacted]w[redacted]d    Secondary diagnosis:  Active Problems:   History of VBAC   Advanced maternal age in multigravida   Maternal obesity affecting pregnancy, antepartum   Supervision of high risk pregnancy, antepartum   Language barrier   Short interval between pregnancies affecting pregnancy, antepartum   Late prenatal care   Subclinical hypothyroidism   Non-reassuring electronic fetal monitoring tracing  Additional problems: None    Discharge diagnosis: Term Pregnancy Delivered and VBAC                                              Post partum procedures:none Augmentation: AROM Complications: None  Hospital course: Induction of Labor With Vaginal Delivery   39y.o. yo G5P5005 at 39w2das admitted to the hospital 03/26/2020 for induction of labor.  Indication for induction: non-reactive NST at term.  Patient had an uncomplicated labor course as follows: arrived at 4cm and started on pitocin, progressed to 5cm and had AROM followed by precipitous delivery 37mlater.  Membrane Rupture Time/Date: 6:15 AM ,03/27/2020   Delivery Method:Vaginal, Spontaneous  Episiotomy: None  Lacerations:  None  Details of delivery can be found in separate delivery note.  Patient had a routine postpartum course. Patient is discharged home 03/28/20.  Newborn Data: Birth date:03/27/2020  Birth time:6:40 AM  Gender:Female  Living status:Living  Apgars:9 ,9  Weight:3260 g   Magnesium Sulfate received: No BMZ received: No Rhophylac:N/A MMR:N/A T-DaP:Given prenatally Flu: none documented Transfusion:No  Physical exam  Vitals:   03/27/20 1750 03/27/20  1918 03/27/20 2212 03/28/20 0549  BP: (!) 81/46 118/72 112/68 (!) 97/52  Pulse: (!) 58 67 70 (!) 59  Resp: 18   16  Temp: 98 F (36.7 C)  97.8 F (36.6 C) 98.6 F (37 C)  TempSrc: Oral  Axillary Oral  SpO2:      Weight:      Height:       General: alert, cooperative and no distress Lochia: appropriate Uterine Fundus: firm Incision: N/A DVT Evaluation: No evidence of DVT seen on physical exam. Labs: Lab Results  Component Value Date   WBC 8.6 03/26/2020   HGB 12.9 03/26/2020   HCT 37.9 03/26/2020   MCV 92.7 03/26/2020   PLT 158 03/26/2020   CMP Latest Ref Rng & Units 11/26/2019  Glucose 65 - 99 mg/dL 75  BUN 6 - 20 mg/dL 5(L)  Creatinine 0.57 - 1.00 mg/dL 0.51(L)  Sodium 134 - 144 mmol/L 135  Potassium 3.5 - 5.2 mmol/L 3.8  Chloride 96 - 106 mmol/L 104  CO2 20 - 29 mmol/L 18(L)  Calcium 8.7 - 10.2 mg/dL 8.5(L)  Total Protein 6.0 - 8.5 g/dL 6.0  Total Bilirubin 0.0 - 1.2 mg/dL 0.2  Alkaline Phos 39 - 117 IU/L 76  AST 0 - 40 IU/L 14  ALT 0 - 32 IU/L 6   Edinburgh Score: Edinburgh Postnatal Depression Scale Screening Tool 02/21/2019  I have been able to laugh and see the funny side of things. 0  I have looked forward with enjoyment to things. 0  I have  blamed myself unnecessarily when things went wrong. 1  I have been anxious or worried for no good reason. 2  I have felt scared or panicky for no good reason. 1  Things have been getting on top of me. 3  I have been so unhappy that I have had difficulty sleeping. 3  I have felt sad or miserable. 1  I have been so unhappy that I have been crying. 0  The thought of harming myself has occurred to me. 0  Edinburgh Postnatal Depression Scale Total 11     After visit meds:  Allergies as of 03/28/2020   No Known Allergies     Medication List    STOP taking these medications   aspirin EC 81 MG tablet   Blood Pressure Kit Chemical engineer Maternity Supp Lg Misc   cyclobenzaprine 10 MG tablet Commonly known as:  FLEXERIL     TAKE these medications   acetaminophen 500 MG tablet Commonly known as: TYLENOL Take 500 mg by mouth every 6 (six) hours as needed.   ibuprofen 600 MG tablet Commonly known as: ADVIL Take 1 tablet (600 mg total) by mouth every 6 (six) hours.   Prenatal Complete 14-0.4 MG Tabs Take 1 tablet by mouth daily.        Discharge home in stable condition Infant Feeding: Bottle and Breast Infant Disposition:home with mother Discharge instruction: per After Visit Summary and Postpartum booklet. Activity: Advance as tolerated. Pelvic rest for 6 weeks.  Diet: routine diet Future Appointments: Future Appointments  Date Time Provider [place]  05/05/2020 10:15 AM Luvenia Redden, PA-C [facility]   Follow up Visit: [place] for [facility] Follow up.   Specialty: Obstetrics and Gynecology Why: For postpartum visit in 4-6 weeks. Contact information: [address] [place] [address] [phone]           Please schedule this patient for a In person postpartum visit in 6 weeks with the following provider: Any provider. Additional Postpartum F/U:none  Low risk pregnancy complicated by: TOLAC Delivery mode:  Vaginal, Spontaneous  Anticipated Birth Control:  Unsure, leaning towards LARC   03/28/2020 Fatima Blank, CNM

## 2020-03-27 NOTE — Progress Notes (Signed)
LABOR PROGRESS NOTE  Priscilla Delgado is a 39 y.o. [id number] at [redacted]w[redacted]d  admitted for IOL for NRNST.  Subjective: Feeling contractions strongly  Objective: BP 103/61   Pulse 77   Temp 98 F (36.7 C) (Oral)   Resp 14   Ht 5\' 8"  (1.727 m)   Wt 132.5 kg   LMP 06/26/2019   SpO2 100%   BMI 44.40 kg/m  or  Vitals:   03/27/20 0207 03/27/20 0304 03/27/20 0408 03/27/20 0506  BP: (!) 98/55 (!) 103/56 (!) 105/53 103/61  Pulse: 75 76 73 77  Resp: 14 14 14 14   Temp:   98 F (36.7 C)   TempSrc:   Oral   SpO2:      Weight:      Height:        Dilation: (P) 4.5 Effacement (%): (P) 80 Cervical Position: (P) Anterior Station: (P) 0 Presentation: (P) Vertex Exam by:: Sia Johnny, RN FHT: baseline rate 140, moderate varibility, +acel, -decel Toco: regular, q2-3 min  Labs: Lab Results  Component Value Date   WBC 8.6 03/26/2020   HGB 12.9 03/26/2020   HCT 37.9 03/26/2020   MCV 92.7 03/26/2020   PLT 158 03/26/2020    Patient Active Problem List   Diagnosis Date Noted  . Non-reassuring electronic fetal monitoring tracing 03/26/2020  . Subclinical hypothyroidism 11/28/2019  . BMI 40.0-44.9, adult ([facility]) 11/26/2019  . Short interval between pregnancies affecting pregnancy, antepartum 11/26/2019  . Late prenatal care 11/26/2019  . History of ulcer disease   . Supervision of high risk pregnancy, antepartum 11/07/2019  . Language barrier 11/07/2019  . Advanced maternal age in multigravida 02/19/2019  . Maternal obesity affecting pregnancy, antepartum 02/19/2019  . History of VBAC 01/30/2015    Assessment / Plan: 39 y.o. 04/01/2015 at [redacted]w[redacted]d here for IOL for NRNST, TOLAC.  Labor: some progress this exam and having good contraction pattern, AROM for clear fluid. Single clot came with fluid, suspect related to cervical change but monitor closely given TOLAC.   Fetal Wellbeing:  Cat I, now reactive w accels Pain Control:  IV pain meds PRN, declines epidural GBS: neg Anticipated MOD:   SVD  TOLAC: CS for breech x3>VBAC x3, consent signed 01/07/2020  [redacted]w[redacted]d, MD/[facility] OB Fellow  03/27/2020, 6:19 AM

## 2020-03-28 MED ORDER — IBUPROFEN 600 MG PO TABS: 600.0000 mg | ORAL_TABLET | Freq: Four times a day (QID) | ORAL | 0 refills | Status: DC

## 2020-03-28 NOTE — Progress Notes (Addendum)
Post Partum Day 1  Subjective:  Dali Kraner is a 39 y.o. [id number] 59w2ds/p VBAC following IOL for NRNST.  No acute events overnight.  Pt denies problems with ambulating, voiding or po intake.  She denies nausea or vomiting.  Pain is moderately controlled, rated 7/10. She has not had bowel movement.  Lochia Small.  Plan for birth control is  still thinking .  Method of Feeding: both  Objective: BP (!) 97/52 (BP Location: Left Arm)   Pulse (!) 59   Temp 98.6 F (37 C) (Oral)   Resp 16   Ht '5\' 8"'$  (1.727 m)   Wt 132.5 kg   LMP 06/26/2019   SpO2 100%   Breastfeeding Unknown   BMI 44.40 kg/m   Physical Exam:  General: groggy but in no distress Lochia:normal flow Chest: normal WOB Heart: regular rate Abdomen: soft, nontender, fundus firm below umbilicus Uterine Fundus: firm DVT Evaluation: No evidence of DVT seen on physical exam. Extremities: no edema  Recent Labs    03/26/20 2341  HGB 12.9  HCT 37.9    Assessment/Plan:  ASSESSMENT: EKeaundra Stehleis a 39y.o. [id number] #1 s/p NSVD doing well.   Discharge home   LOS: 2 days   Alexa Holloway 03/28/2020, 6:47 AM    I personally saw and evaluated the patient, performing the key elements of the service. I developed and verified the management plan that is described in the resident's/student's note, and I agree with the content with my edits above. VSS, HRR&R, Resp unlabored, Legs neg.  FrNigel BertholdCNM 04/03/2020 7:24 PM

## 2020-04-01 ENCOUNTER — Encounter: Payer: Self-pay | Admitting: Physical Therapy

## 2020-04-07 ENCOUNTER — Encounter: Payer: Medicaid Other | Admitting: Obstetrics & Gynecology

## 2020-04-08 ENCOUNTER — Encounter: Payer: Self-pay | Admitting: Physical Therapy

## 2020-04-29 DIAGNOSIS — H52203 Unspecified astigmatism, bilateral: Secondary | ICD-10-CM | POA: Diagnosis not present

## 2020-05-05 ENCOUNTER — Ambulatory Visit: Payer: Self-pay | Admitting: Medical

## 2020-05-05 ENCOUNTER — Encounter: Payer: Self-pay | Admitting: Family Medicine

## 2020-05-22 ENCOUNTER — Telehealth (HOSPITAL_COMMUNITY): Payer: Self-pay | Admitting: *Deleted

## 2020-05-22 NOTE — Telephone Encounter (Signed)
Received referral for new patient appt. Called number on file and was unable to reach patient and patient do not have voicemail

## 2020-05-22 NOTE — Telephone Encounter (Signed)
Opened in Error.

## 2020-07-04 ENCOUNTER — Other Ambulatory Visit: Payer: Self-pay

## 2020-07-04 ENCOUNTER — Encounter: Payer: Self-pay | Admitting: Nurse Practitioner

## 2020-07-04 ENCOUNTER — Ambulatory Visit (INDEPENDENT_AMBULATORY_CARE_PROVIDER_SITE_OTHER): Payer: Medicaid Other | Admitting: Nurse Practitioner

## 2020-07-04 VITALS — BP 97/57 | HR 60 | Temp 97.3°F | Ht 63.0 in | Wt 297.0 lb

## 2020-07-04 DIAGNOSIS — K5900 Constipation, unspecified: Secondary | ICD-10-CM

## 2020-07-04 DIAGNOSIS — Z6841 Body Mass Index (BMI) 40.0 and over, adult: Secondary | ICD-10-CM

## 2020-07-04 DIAGNOSIS — M5440 Lumbago with sciatica, unspecified side: Secondary | ICD-10-CM | POA: Diagnosis not present

## 2020-07-04 NOTE — Patient Instructions (Addendum)
Health Maintenance, Female Adopting a healthy lifestyle and getting preventive care are important in promoting health and wellness. Ask your health care provider about:  The right schedule for you to have regular tests and exams.  Things you can do on your own to prevent diseases and keep yourself healthy. What should I know about diet, weight, and exercise? Eat a healthy diet   Eat a diet that includes plenty of vegetables, fruits, low-fat dairy products, and lean protein.  Do not eat a lot of foods that are high in solid fats, added sugars, or sodium. Maintain a healthy weight Body mass index (BMI) is used to identify weight problems. It estimates body fat based on height and weight. Your health care provider can help determine your BMI and help you achieve or maintain a healthy weight. Get regular exercise Get regular exercise. This is one of the most important things you can do for your health. Most adults should:  Exercise for at least 150 minutes each week. The exercise should increase your heart rate and make you sweat (moderate-intensity exercise).  Do strengthening exercises at least twice a week. This is in addition to the moderate-intensity exercise.  Spend less time sitting. Even light physical activity can be beneficial. Watch cholesterol and blood lipids Have your blood tested for lipids and cholesterol at 37 years of age, then have this test every 5 years. Have your cholesterol levels checked more often if:  Your lipid or cholesterol levels are high.  You are older than 37 years of age.  You are at high risk for heart disease. What should I know about cancer screening? Depending on your health history and family history, you may need to have cancer screening at various ages. This may include screening for:  Breast cancer.  Cervical cancer.  Colorectal cancer.  Skin cancer.  Lung cancer. What should I know about heart disease, diabetes, and high blood  pressure? Blood pressure and heart disease  High blood pressure causes heart disease and increases the risk of stroke. This is more likely to develop in people who have high blood pressure readings, are of African descent, or are overweight.  Have your blood pressure checked: ? Every 3-5 years if you are 18-39 years of age. ? Every year if you are 40 years old or older. Diabetes Have regular diabetes screenings. This checks your fasting blood sugar level. Have the screening done:  Once every three years after age 40 if you are at a normal weight and have a low risk for diabetes.  More often and at a younger age if you are overweight or have a high risk for diabetes. What should I know about preventing infection? Hepatitis B If you have a higher risk for hepatitis B, you should be screened for this virus. Talk with your health care provider to find out if you are at risk for hepatitis B infection. Hepatitis C Testing is recommended for:  Everyone born from 1945 through 1965.  Anyone with known risk factors for hepatitis C. Sexually transmitted infections (STIs)  Get screened for STIs, including gonorrhea and chlamydia, if: ? You are sexually active and are younger than 37 years of age. ? You are older than 37 years of age and your health care provider tells you that you are at risk for this type of infection. ? Your sexual activity has changed since you were last screened, and you are at increased risk for chlamydia or gonorrhea. Ask your health care provider if   you are at risk.  Ask your health care provider about whether you are at high risk for HIV. Your health care provider may recommend a prescription medicine to help prevent HIV infection. If you choose to take medicine to prevent HIV, you should first get tested for HIV. You should then be tested every 3 months for as long as you are taking the medicine. Pregnancy  If you are about to stop having your period (premenopausal) and  you may become pregnant, seek counseling before you get pregnant.  Take 400 to 800 micrograms (mcg) of folic acid every day if you become pregnant.  Ask for birth control (contraception) if you want to prevent pregnancy. Osteoporosis and menopause Osteoporosis is a disease in which the bones lose minerals and strength with aging. This can result in bone fractures. If you are 29 years old or older, or if you are at risk for osteoporosis and fractures, ask your health care provider if you should:  Be screened for bone loss.  Take a calcium or vitamin D supplement to lower your risk of fractures.  Be given hormone replacement therapy (HRT) to treat symptoms of menopause. Follow these instructions at home: Lifestyle  Do not use any products that contain nicotine or tobacco, such as cigarettes, e-cigarettes, and chewing tobacco. If you need help quitting, ask your health care provider.  Do not use street drugs.  Do not share needles.  Ask your health care provider for help if you need support or information about quitting drugs. Alcohol use  Do not drink alcohol if: ? Your health care provider tells you not to drink. ? You are pregnant, may be pregnant, or are planning to become pregnant.  If you drink alcohol: ? Limit how much you use to 0-1 drink a day. ? Limit intake if you are breastfeeding.  Be aware of how much alcohol is in your drink. In the [place] one drink equals one 12 oz bottle of beer (355 mL), one 5 oz glass of wine (148 mL), or one 1 oz glass of hard liquor (44 mL). General instructions  Schedule regular health, dental, and eye exams.  Stay current with your vaccines.  Tell your health care provider if: ? You often feel depressed. ? You have ever been abused or do not feel safe at home. Summary  Adopting a healthy lifestyle and getting preventive care are important in promoting health and wellness.  Follow your health care provider's instructions about healthy  diet, exercising, and getting tested or screened for diseases.  Follow your health care provider's instructions on monitoring your cholesterol and blood pressure. This information is not intended to replace advice given to you by your health care provider. Make sure you discuss any questions you have with your health care provider. Document Revised: 10/04/2018 Document Reviewed: 10/04/2018 Elsevier Patient Education  2020 [employer].   Back Exercises These exercises help to make your trunk and back strong. They also help to keep the lower back flexible. Doing these exercises can help to prevent back pain or lessen existing pain.  If you have back pain, try to do these exercises 2-3 times each day or as told by your doctor.  As you get better, do the exercises once each day. Repeat the exercises more often as told by your doctor.  To stop back pain from coming back, do the exercises once each day, or as told by your doctor. Exercises Single knee to chest Do these steps 3-5 times in  a row for each leg: 1. Lie on your back on a firm bed or the floor with your legs stretched out. 2. Bring one knee to your chest. 3. Grab your knee or thigh with both hands and hold them it in place. 4. Pull on your knee until you feel a gentle stretch in your lower back or buttocks. 5. Keep doing the stretch for 10-30 seconds. 6. Slowly let go of your leg and straighten it. Pelvic tilt Do these steps 5-10 times in a row: 1. Lie on your back on a firm bed or the floor with your legs stretched out. 2. Bend your knees so they point up to the ceiling. Your feet should be flat on the floor. 3. Tighten your lower belly (abdomen) muscles to press your lower back against the floor. This will make your tailbone point up to the ceiling instead of pointing down to your feet or the floor. 4. Stay in this position for 5-10 seconds while you gently tighten your muscles and breathe evenly. Cat-cow Do these steps until  your lower back bends more easily: 1. Get on your hands and knees on a firm surface. Keep your hands under your shoulders, and keep your knees under your hips. You may put padding under your knees. 2. Let your head hang down toward your chest. Tighten (contract) the muscles in your belly. Point your tailbone toward the floor so your lower back becomes rounded like the back of a cat. 3. Stay in this position for 5 seconds. 4. Slowly lift your head. Let the muscles of your belly relax. Point your tailbone up toward the ceiling so your back forms a sagging arch like the back of a cow. 5. Stay in this position for 5 seconds.  Press-ups Do these steps 5-10 times in a row: 1. Lie on your belly (face-down) on the floor. 2. Place your hands near your head, about shoulder-width apart. 3. While you keep your back relaxed and keep your hips on the floor, slowly straighten your arms to raise the top half of your body and lift your shoulders. Do not use your back muscles. You may change where you place your hands in order to make yourself more comfortable. 4. Stay in this position for 5 seconds. 5. Slowly return to lying flat on the floor.  Bridges Do these steps 10 times in a row: 1. Lie on your back on a firm surface. 2. Bend your knees so they point up to the ceiling. Your feet should be flat on the floor. Your arms should be flat at your sides, next to your body. 3. Tighten your butt muscles and lift your butt off the floor until your waist is almost as high as your knees. If you do not feel the muscles working in your butt and the back of your thighs, slide your feet 1-2 inches farther away from your butt. 4. Stay in this position for 3-5 seconds. 5. Slowly lower your butt to the floor, and let your butt muscles relax. If this exercise is too easy, try doing it with your arms crossed over your chest. Belly crunches Do these steps 5-10 times in a row: 1. Lie on your back on a firm bed or the floor  with your legs stretched out. 2. Bend your knees so they point up to the ceiling. Your feet should be flat on the floor. 3. Cross your arms over your chest. 4. Tip your chin a little bit toward your chest  but do not bend your neck. 5. Tighten your belly muscles and slowly raise your chest just enough to lift your shoulder blades a tiny bit off of the floor. Avoid raising your body higher than that, because it can put too much stress on your low back. 6. Slowly lower your chest and your head to the floor. Back lifts Do these steps 5-10 times in a row: 1. Lie on your belly (face-down) with your arms at your sides, and rest your forehead on the floor. 2. Tighten the muscles in your legs and your butt. 3. Slowly lift your chest off of the floor while you keep your hips on the floor. Keep the back of your head in line with the curve in your back. Look at the floor while you do this. 4. Stay in this position for 3-5 seconds. 5. Slowly lower your chest and your face to the floor. Contact a doctor if:  Your back pain gets a lot worse when you do an exercise.  Your back pain does not get better 2 hours after you exercise. If you have any of these problems, stop doing the exercises. Do not do them again unless your doctor says it is okay. Get help right away if:  You have sudden, very bad back pain. If this happens, stop doing the exercises. Do not do them again unless your doctor says it is okay. This information is not intended to replace advice given to you by your health care provider. Make sure you discuss any questions you have with your health care provider. Document Revised: 07/06/2018 Document Reviewed: 07/06/2018 Elsevier Patient Education  2020 [employer].  Constipation, Adult Constipation is when a person:  Poops (has a bowel movement) fewer times in a week than normal.  Has a hard time pooping.  Has poop that is dry, hard, or bigger than normal. Follow these instructions at  home: Eating and drinking   Eat foods that have a lot of fiber, such as: ? Fresh fruits and vegetables. ? Whole grains. ? Beans.  Eat less of foods that are high in fat, low in fiber, or overly processed, such as: ? [place] fries. ? Hamburgers. ? Cookies. ? Candy. ? Soda.  Drink enough fluid to keep your pee (urine) clear or pale yellow. General instructions  Exercise regularly or as told by your doctor.  Go to the restroom when you feel like you need to poop. Do not hold it in.  Take over-the-counter and prescription medicines only as told by your doctor. These include any fiber supplements.  Do pelvic floor retraining exercises, such as: ? Doing deep breathing while relaxing your lower belly (abdomen). ? Relaxing your pelvic floor while pooping.  Watch your condition for any changes.  Keep all follow-up visits as told by your doctor. This is important. Contact a doctor if:  You have pain that gets worse.  You have a fever.  You have not pooped for 4 days.  You throw up (vomit).  You are not hungry.  You lose weight.  You are bleeding from the anus.  You have thin, pencil-like poop (stool). Get help right away if:  You have a fever, and your symptoms suddenly get worse.  You leak poop or have blood in your poop.  Your belly feels hard or bigger than normal (is bloated).  You have very bad belly pain.  You feel dizzy or you faint. This information is not intended to replace advice given to you by  your health care provider. Make sure you discuss any questions you have with your health care provider. Document Revised: 09/23/2017 Document Reviewed: 03/31/2016 Elsevier Patient Education  2020 Elsevier Inc.   Stool softener colace or senokot If this is not effective then use Miralax. Daily until the stools are normal.

## 2020-07-04 NOTE — Progress Notes (Signed)
[facility] Patient [facility] [address] [place], [place]  [address] Phone:  [phone]   Fax:  [phone] .   New Patient Office Visit  Subjective:  Patient ID: Priscilla Delgado, female    DOB: 1980/11/16  Age: 39 y.o. MRN: [id number]  CC:  Chief Complaint  Patient presents with   Establish Care    low back pain taking Ibup, onset after giving birth June 2021    HPI Priscilla Delgado presents to establish care. She  has a past medical history of Ulcer of the stomach and intestine.    Low Back Pain Patient complains of chronic low back pain. This is evaluated as a personal injury. The patient first noted symptoms several months ago. It was related to no known injury and child birth. The pain is rated moderate, and is located at the across the lower back. The pain is described as aching and occurs intermittently. The symptoms admits to been progressive. Symptoms are exacerbated by standing. Factors which relieve the pain include change in body position, NSAIDs and rest. Other associated symptoms include no other symptoms. Previous history of symptoms: never. Treatment efforts have included rest and OTC NSAIDS, with and without relief.   Past Medical History:  Diagnosis Date   Ulcer of the stomach and intestine     Past Surgical History:  Procedure Laterality Date   CESAREAN SECTION      History reviewed. No pertinent family history.  Social History   Socioeconomic History   Marital status: Single    Spouse name: Not on file   Number of children: Not on file   Years of education: Not on file   Highest education level: Not on file  Occupational History   Not on file  Tobacco Use   Smoking status: Never Smoker   Smokeless tobacco: Never Used  Vaping Use   Vaping Use: Never used  Substance and Sexual Activity   Alcohol use: Yes    Alcohol/week: 1.0 standard drink    Types: 1 Glasses of wine per week    Comment: sometimes on weekends   Drug use:  Never   Sexual activity: Not Currently    Birth control/protection: None  Other Topics Concern   Not on file  Social History Narrative   ** Merged History Encounter **       Social Determinants of Health   Financial Resource Strain:    Difficulty of Paying Living Expenses: Not on file  Food Insecurity: No Food Insecurity   Worried About Programme researcher, broadcasting/film/video in the Last Year: Never true   Ran Out of Food in the Last Year: Never true  Transportation Needs: No Transportation Needs   Lack of Transportation (Medical): No   Lack of Transportation (Non-Medical): No  Physical Activity:    Days of Exercise per Week: Not on file   Minutes of Exercise per Session: Not on file  Stress:    Feeling of Stress : Not on file  Social Connections:    Frequency of Communication with Friends and Family: Not on file   Frequency of Social Gatherings with Friends and Family: Not on file   Attends Religious Services: Not on file   Active Member of Clubs or Organizations: Not on file   Attends Banker Meetings: Not on file   Marital Status: Not on file  Intimate Partner Violence:    Fear of Current or Ex-Partner: Not on file   Emotionally Abused: Not on file  Physically Abused: Not on file   Sexually Abused: Not on file    ROS Review of Systems  Constitutional: Negative for chills and fatigue.  HENT: Negative for congestion and sinus pain.   Eyes: Positive for visual disturbance. Negative for photophobia.       She wears glasses as needed  Respiratory: Negative for shortness of breath.   Cardiovascular: Negative for chest pain.  Gastrointestinal: Positive for constipation.       Rectal bleeding with constipation   Endocrine: Negative.     Objective:   Today's Vitals: BP (!) 97/57    Pulse 60    Temp (!) 97.3 F (36.3 C) (Temporal)    Ht 5\' 3"  (1.6 m)    Wt 297 lb (134.7 kg)    SpO2 100%    BMI 52.61 kg/m   Physical Exam Constitutional:      General:  She is not in acute distress.    Appearance: She is obese. She is not ill-appearing or toxic-appearing.  HENT:     Head: Normocephalic and atraumatic.     Nose: Nose normal.     Mouth/Throat:     Mouth: Mucous membranes are moist.  Cardiovascular:     Rate and Rhythm: Normal rate and regular rhythm.     Pulses: Normal pulses.     Heart sounds: Normal heart sounds.  Pulmonary:     Effort: Pulmonary effort is normal.     Breath sounds: Normal breath sounds.  Abdominal:     Palpations: Abdomen is soft.  Musculoskeletal:        General: Tenderness present.     Cervical back: Normal range of motion.  Skin:    General: Skin is warm and dry.     Capillary Refill: Capillary refill takes less than 2 seconds.  Neurological:     General: No focal deficit present.     Mental Status: She is alert and oriented to person, place, and time.     Assessment & Plan:   Problem List Items Addressed This Visit      Other   BMI 40.0-44.9, adult (HCC)    Other Visit Diagnoses    Constipation, unspecified constipation type    -  Primary   Morbid obesity (HCC)     Obesity with BMI as noted above.  Discussed proper diet (low fat, low sodium, high fiber) with patient.  Discussed need for regular exercise (3 times per week, 20 minutes per session) with patient.    Relevant Orders   Amb Ref to Medical Weight Management   Bilateral low back pain with sciatica, sciatica laterality unspecified, unspecified chronicity    we will continue to monitor symptoms persist or get worse will further evaluate with imaging.  Right now the concern is to work on weight loss.  I did provide the patient a work note with some accommodations for sitting when needed.      Outpatient Encounter Medications as of 07/04/2020  Medication Sig   acetaminophen (TYLENOL) 500 MG tablet Take 500 mg by mouth every 6 (six) hours as needed.   ibuprofen (ADVIL) 600 MG tablet Take 1 tablet (600 mg total) by mouth every 6 (six) hours.    Prenatal Vit-Fe Fumarate-FA (PRENATAL COMPLETE) 14-0.4 MG TABS Take 1 tablet by mouth daily.   No facility-administered encounter medications on file as of 07/04/2020.    Follow-up: Return for follow up as needed.   09/03/2020, NP

## 2020-07-09 ENCOUNTER — Encounter (HOSPITAL_COMMUNITY): Payer: Self-pay | Admitting: Obstetrics and Gynecology

## 2020-07-28 ENCOUNTER — Ambulatory Visit (INDEPENDENT_AMBULATORY_CARE_PROVIDER_SITE_OTHER): Payer: Self-pay | Admitting: Family Medicine

## 2020-08-11 ENCOUNTER — Ambulatory Visit (INDEPENDENT_AMBULATORY_CARE_PROVIDER_SITE_OTHER): Payer: Self-pay | Admitting: Family Medicine

## 2020-08-11 DIAGNOSIS — Z0289 Encounter for other administrative examinations: Secondary | ICD-10-CM

## 2020-08-25 ENCOUNTER — Ambulatory Visit (INDEPENDENT_AMBULATORY_CARE_PROVIDER_SITE_OTHER): Payer: Medicaid Other | Admitting: Family Medicine

## 2020-08-25 ENCOUNTER — Other Ambulatory Visit: Payer: Self-pay

## 2020-08-25 DIAGNOSIS — Z0289 Encounter for other administrative examinations: Secondary | ICD-10-CM

## 2020-09-08 ENCOUNTER — Ambulatory Visit (INDEPENDENT_AMBULATORY_CARE_PROVIDER_SITE_OTHER): Payer: Medicaid Other | Admitting: Family Medicine

## 2020-11-14 DIAGNOSIS — Z23 Encounter for immunization: Secondary | ICD-10-CM | POA: Diagnosis not present

## 2020-12-23 DIAGNOSIS — R7612 Nonspecific reaction to cell mediated immunity measurement of gamma interferon antigen response without active tuberculosis: Secondary | ICD-10-CM | POA: Diagnosis not present

## 2020-12-25 ENCOUNTER — Ambulatory Visit
Admission: RE | Admit: 2020-12-25 | Discharge: 2020-12-25 | Disposition: A | Discharge status: Home / Self Care | Payer: No Typology Code available for payment source | Source: Ambulatory Visit | Attending: Obstetrics and Gynecology | Admitting: Obstetrics and Gynecology

## 2020-12-25 ENCOUNTER — Other Ambulatory Visit: Payer: Self-pay | Admitting: Obstetrics and Gynecology

## 2020-12-25 ENCOUNTER — Other Ambulatory Visit: Payer: Self-pay

## 2020-12-25 DIAGNOSIS — Z111 Encounter for screening for respiratory tuberculosis: Secondary | ICD-10-CM

## 2021-01-02 DIAGNOSIS — Z111 Encounter for screening for respiratory tuberculosis: Secondary | ICD-10-CM | POA: Diagnosis not present

## 2021-01-02 DIAGNOSIS — R7612 Nonspecific reaction to cell mediated immunity measurement of gamma interferon antigen response without active tuberculosis: Secondary | ICD-10-CM | POA: Diagnosis not present

## 2021-03-04 DIAGNOSIS — R7612 Nonspecific reaction to cell mediated immunity measurement of gamma interferon antigen response without active tuberculosis: Secondary | ICD-10-CM | POA: Diagnosis not present

## 2021-08-08 ENCOUNTER — Inpatient Hospital Stay (HOSPITAL_COMMUNITY)
Admission: EM | Admit: 2021-08-08 | Discharge: 2021-08-09 | Disposition: A | Discharge status: Home / Self Care | Payer: Medicaid Other | Attending: Obstetrics & Gynecology | Admitting: Obstetrics & Gynecology

## 2021-08-08 ENCOUNTER — Other Ambulatory Visit: Payer: Self-pay

## 2021-08-08 ENCOUNTER — Encounter (HOSPITAL_COMMUNITY): Payer: Self-pay | Admitting: *Deleted

## 2021-08-08 DIAGNOSIS — M549 Dorsalgia, unspecified: Secondary | ICD-10-CM | POA: Insufficient documentation

## 2021-08-08 DIAGNOSIS — O26899 Other specified pregnancy related conditions, unspecified trimester: Secondary | ICD-10-CM

## 2021-08-08 DIAGNOSIS — Z3A01 Less than 8 weeks gestation of pregnancy: Secondary | ICD-10-CM | POA: Diagnosis not present

## 2021-08-08 DIAGNOSIS — O0941 Supervision of pregnancy with grand multiparity, first trimester: Secondary | ICD-10-CM | POA: Insufficient documentation

## 2021-08-08 DIAGNOSIS — N760 Acute vaginitis: Secondary | ICD-10-CM | POA: Diagnosis not present

## 2021-08-08 DIAGNOSIS — O23591 Infection of other part of genital tract in pregnancy, first trimester: Secondary | ICD-10-CM | POA: Insufficient documentation

## 2021-08-08 DIAGNOSIS — R1084 Generalized abdominal pain: Secondary | ICD-10-CM | POA: Diagnosis not present

## 2021-08-08 DIAGNOSIS — B9689 Other specified bacterial agents as the cause of diseases classified elsewhere: Secondary | ICD-10-CM | POA: Diagnosis not present

## 2021-08-08 DIAGNOSIS — O26891 Other specified pregnancy related conditions, first trimester: Secondary | ICD-10-CM | POA: Diagnosis not present

## 2021-08-08 DIAGNOSIS — Z3201 Encounter for pregnancy test, result positive: Secondary | ICD-10-CM | POA: Insufficient documentation

## 2021-08-08 DIAGNOSIS — R109 Unspecified abdominal pain: Secondary | ICD-10-CM | POA: Insufficient documentation

## 2021-08-08 DIAGNOSIS — R1030 Lower abdominal pain, unspecified: Secondary | ICD-10-CM

## 2021-08-08 DIAGNOSIS — O99891 Other specified diseases and conditions complicating pregnancy: Secondary | ICD-10-CM | POA: Insufficient documentation

## 2021-08-08 DIAGNOSIS — R1032 Left lower quadrant pain: Secondary | ICD-10-CM | POA: Diagnosis not present

## 2021-08-08 LAB — COMPREHENSIVE METABOLIC PANEL
ALT: 14 U/L (ref 0–44)
AST: 16 U/L (ref 15–41)
Albumin: 3.8 g/dL (ref 3.5–5.0)
Alkaline Phosphatase: 62 U/L (ref 38–126)
Anion gap: 8 (ref 5–15)
BUN: 11 mg/dL (ref 6–20)
CO2: 23 mmol/L (ref 22–32)
Calcium: 9.1 mg/dL (ref 8.9–10.3)
Chloride: 105 mmol/L (ref 98–111)
Creatinine, Ser: 0.83 mg/dL (ref 0.44–1.00)
GFR, Estimated: >60 mL/min (ref >60)
Glucose, Bld: 94 mg/dL (ref 70–99)
Potassium: 4 mmol/L (ref 3.5–5.1)
Sodium: 136 mmol/L (ref 135–145)
Total Bilirubin: 0.7 mg/dL (ref 0.3–1.2)
Total Protein: 6.9 g/dL (ref 6.5–8.1)

## 2021-08-08 LAB — CBC WITH DIFFERENTIAL/PLATELET
Abs Immature Granulocytes: 0.02 10*3/uL (ref 0.00–0.07)
Basophils Absolute: 0 10*3/uL (ref 0.0–0.1)
Basophils Relative: 1 %
Eosinophils Absolute: 0.1 10*3/uL (ref 0.0–0.5)
Eosinophils Relative: 1 %
HCT: 40.4 % (ref 36.0–46.0)
Hemoglobin: 13.4 g/dL (ref 12.0–15.0)
Immature Granulocytes: 0 %
Lymphocytes Relative: 39 %
Lymphs Abs: 2.5 10*3/uL (ref 0.7–4.0)
MCH: 30 pg (ref 26.0–34.0)
MCHC: 33.2 g/dL (ref 30.0–36.0)
MCV: 90.6 fL (ref 80.0–100.0)
Monocytes Absolute: 0.5 10*3/uL (ref 0.1–1.0)
Monocytes Relative: 8 %
Neutro Abs: 3.3 10*3/uL (ref 1.7–7.7)
Neutrophils Relative %: 51 %
Platelets: 230 10*3/uL (ref 150–400)
RBC: 4.46 MIL/uL (ref 3.87–5.11)
RDW: 13.2 % (ref 11.5–15.5)
WBC: 6.5 10*3/uL (ref 4.0–10.5)
nRBC: 0 % (ref 0.0–0.2)

## 2021-08-08 LAB — LIPASE, BLOOD: Lipase: 21 U/L (ref 11–51)

## 2021-08-08 LAB — I-STAT BETA HCG BLOOD, ED (MC, WL, AP ONLY): I-stat hCG, quantitative: >2000 m[IU]/mL — ABNORMAL HIGH (ref <5)

## 2021-08-08 NOTE — ED Provider Notes (Addendum)
Emergency Medicine Provider Triage Evaluation Note  Priscilla Delgado , a 40 y.o. female  was evaluated in triage.  Pt complains of multiple complaints.  Low back pain, headache, lower abdominal pain.  Thinks she may be pregnant as cycle is about 1 month late. Denies urinary symptoms. Also reports headache  Review of Systems  Positive: Low back pain, abdominal pain, headache Negative: Fever, vomiting  Physical Exam  BP 112/67 (BP Location: Right Arm)   Pulse (!) 58   Temp 98.1 F (36.7 C) (Oral)   Resp 14   SpO2 99%  Gen:   Awake, no distress   Resp:  Normal effort  MSK:   Moves extremities without difficulty  Other:    Medical Decision Making  Medically screening exam initiated at 10:21 PM.  Appropriate orders placed.  Priscilla Delgado was informed that the remainder of the evaluation will be completed by another provider, this initial triage assessment does not replace that evaluation, and the importance of remaining in the ED until their evaluation is complete.  Low back pain, headache, abdominal pain.  Period currently 1 month late.  Labs, UA, preg test.  10:46 PM Patient found to be pregnant here with hcg >2000.  G6P5 as per chart review.  Discussed with MAU provider, Priscilla Delgado, who accepts in transfer.   Priscilla Hatchet, PA-C 08/08/21 2223    Priscilla Hatchet, PA-C 08/08/21 2252    Arby Barrette, MD 08/10/21 1556

## 2021-08-08 NOTE — ED Triage Notes (Signed)
Pt states lower abd pain, R lower back pain and headache x 3 days.  States 1 missed period and today some vaginal bleeding.

## 2021-08-08 NOTE — ED Notes (Signed)
Notified MAU we're sending a pt.

## 2021-08-09 ENCOUNTER — Inpatient Hospital Stay (HOSPITAL_COMMUNITY): Payer: Medicaid Other

## 2021-08-09 ENCOUNTER — Other Ambulatory Visit: Payer: Self-pay

## 2021-08-09 ENCOUNTER — Encounter (HOSPITAL_COMMUNITY): Payer: Self-pay | Admitting: Obstetrics & Gynecology

## 2021-08-09 DIAGNOSIS — R1030 Lower abdominal pain, unspecified: Secondary | ICD-10-CM | POA: Diagnosis not present

## 2021-08-09 DIAGNOSIS — B9689 Other specified bacterial agents as the cause of diseases classified elsewhere: Secondary | ICD-10-CM | POA: Diagnosis not present

## 2021-08-09 DIAGNOSIS — R1032 Left lower quadrant pain: Secondary | ICD-10-CM | POA: Diagnosis not present

## 2021-08-09 DIAGNOSIS — N760 Acute vaginitis: Secondary | ICD-10-CM | POA: Diagnosis not present

## 2021-08-09 DIAGNOSIS — O99891 Other specified diseases and conditions complicating pregnancy: Secondary | ICD-10-CM

## 2021-08-09 DIAGNOSIS — Z3A01 Less than 8 weeks gestation of pregnancy: Secondary | ICD-10-CM

## 2021-08-09 DIAGNOSIS — O26891 Other specified pregnancy related conditions, first trimester: Secondary | ICD-10-CM | POA: Diagnosis not present

## 2021-08-09 LAB — URINALYSIS, ROUTINE W REFLEX MICROSCOPIC
Bilirubin Urine: NEGATIVE
Glucose, UA: NEGATIVE mg/dL
Hgb urine dipstick: NEGATIVE
Ketones, ur: NEGATIVE mg/dL
Leukocytes,Ua: NEGATIVE
Nitrite: POSITIVE — AB
Protein, ur: NEGATIVE mg/dL
Specific Gravity, Urine: 1.024 (ref 1.005–1.030)
pH: 6 (ref 5.0–8.0)

## 2021-08-09 LAB — WET PREP, GENITAL
Sperm: NONE SEEN
Trich, Wet Prep: NONE SEEN
Yeast Wet Prep HPF POC: NONE SEEN

## 2021-08-09 LAB — HCG, QUANTITATIVE, PREGNANCY: hCG, Beta Chain, Quant, S: 30799 m[IU]/mL — ABNORMAL HIGH (ref <5)

## 2021-08-09 MED ORDER — METRONIDAZOLE 0.75 % VA GEL: 1.0000 | Freq: Every day | VAGINAL | 0 refills | Status: DC

## 2021-08-09 MED ORDER — CEFADROXIL 500 MG PO CAPS: 500.0000 mg | ORAL_CAPSULE | Freq: Two times a day (BID) | ORAL | 0 refills | Status: DC

## 2021-08-09 NOTE — Progress Notes (Signed)
Order for [facility] sent to pharmacy based on UA with +nitrates and + bacteria.  Culture previously sent.

## 2021-08-09 NOTE — Discharge Instructions (Signed)
  [facility] Providers          [facility]  [address], [place], [place] [address]  [phone]  [facility]  [address], [place], [place] [address]  [phone]  [facility]  [address], , [place] [address]  [phone]  [facility] [place]  [address], [place], [place] [address]  [phone]  [facility]  [address] floor), [place], [place] [address]  [phone]  [facility] [address], [place], [place] [address] [phone]  [facility]  [address], [place], [place] [address]  [phone]  [facility]  [address], [place], [place] [address]  [phone]  [facility]  [address], [place], [place] [address]  [phone]  [facility]  [address], [place], [place] [address]  [phone]  [facility]  [address], [place], [place] [address]  [phone]  [facility]  [address], [place], [place] [address]  [phone]  [facility]   [address], [place], [place] [address]  [phone]  [facility]  [address], , [place] [address]   [phone]  [facility]  [address], ,  [address]  [phone]         

## 2021-08-09 NOTE — MAU Note (Signed)
Pt transferred from [facility] with positive pregnancy test. Pt c/o abdominal pain since yesterday and vaginal bleeding.   Does not remember LMP but had some bleeding on 9/5

## 2021-08-09 NOTE — MAU Provider Note (Signed)
History     CSN: [id number]  Arrival date and time: 08/08/21 2047   Event Date/Time   First Provider Initiated Contact with Patient 08/09/21 0117      Chief Complaint  Patient presents with   Abdominal Pain   Priscilla Delgado is a 40 y.o. G6P5005 at 5 wk 6 days by definite LMP of Sept 7, 2022 who has not established PNC.  She presents today for Abdominal Pain and Vaginal Bleeding.  Patient reports abdominal cramping started yesterday but back pain has been present for the past 3 weeks.  She reports taking Tylenol with minimal relief.  She states bleeding started this evening at 9.  She also reports discharge "like before the period"  that started prior to the bleeding.  Patient denies pain or difficulty with urination nausea or vomiting or recent sex.  She expresses concern that she has never experienced bleeding and in previous pregnancies.    OB History     Gravida  5   Para  5   Term  5   Preterm  0   AB  0   Living  5      SAB  0   IAB  0   Ectopic  0   Multiple      Live Births  5        Obstetric Comments  C/s for breech- leg was coming down         Past Medical History:  Diagnosis Date   Ulcer of the stomach and intestine     Past Surgical History:  Procedure Laterality Date   CESAREAN SECTION      No family history on file.  Social History   Tobacco Use   Smoking status: Never   Smokeless tobacco: Never  Vaping Use   Vaping Use: Never used  Substance Use Topics   Alcohol use: Yes    Alcohol/week: 1.0 standard drink    Types: 1 Glasses of wine per week    Comment: sometimes on weekends   Drug use: Never    Allergies: No Known Allergies  Medications Prior to Admission  Medication Sig Dispense Refill Last Dose   acetaminophen (TYLENOL) 500 MG tablet Take 500 mg by mouth every 6 (six) hours as needed.      ibuprofen (ADVIL) 600 MG tablet Take 1 tablet (600 mg total) by mouth every 6 (six) hours. 30 tablet 0    Prenatal Vit-Fe  Fumarate-FA (PRENATAL COMPLETE) 14-0.4 MG TABS Take 1 tablet by mouth daily. 30 each 2     Review of Systems  Gastrointestinal:  Positive for abdominal pain. Negative for nausea.  Genitourinary:  Positive for vaginal bleeding and vaginal discharge. Negative for difficulty urinating and dysuria.  Musculoskeletal:  Positive for back pain.  Neurological:  Negative for dizziness, light-headedness and headaches.  Physical Exam   Blood pressure 114/64, pulse 63, temperature 98 F (36.7 C), temperature source Oral, resp. rate 20, height 5\' 6"  (1.676 m), weight 134.3 kg, last menstrual period 06/29/2021, SpO2 99 %, unknown if currently breastfeeding.  Physical Exam Vitals reviewed.  Constitutional:      Appearance: She is well-developed. She is obese.  HENT:     Head: Normocephalic and atraumatic.  Eyes:     Conjunctiva/sclera: Conjunctivae normal.  Cardiovascular:     Rate and Rhythm: Normal rate and regular rhythm.  Pulmonary:     Effort: Pulmonary effort is normal.     Breath sounds: Normal breath sounds.  Abdominal:  Tenderness: There is abdominal tenderness in the right lower quadrant, suprapubic area and left lower quadrant.  Musculoskeletal:     Cervical back: Normal range of motion.  Skin:    General: Skin is warm and dry.  Neurological:     Mental Status: She is alert and oriented to person, place, and time.  Psychiatric:        Mood and Affect: Mood normal.        Behavior: Behavior normal.    MAU Course  Procedures Results for orders placed or performed during the hospital encounter of 08/08/21 (from the past 24 hour(s))  CBC with Differential     Status: None   Collection Time: 08/08/21 10:26 PM  Result Value Ref Range   WBC 6.5 4.0 - 10.5 K/uL   RBC 4.46 3.87 - 5.11 MIL/uL   Hemoglobin 13.4 12.0 - 15.0 g/dL   HCT 78.6 76.7 - 20.9 %   MCV 90.6 80.0 - 100.0 fL   MCH 30.0 26.0 - 34.0 pg   MCHC 33.2 30.0 - 36.0 g/dL   RDW 47.0 96.2 - 83.6 %   Platelets 230 150  - 400 K/uL   nRBC 0.0 0.0 - 0.2 %   Neutrophils Relative % 51 %   Neutro Abs 3.3 1.7 - 7.7 K/uL   Lymphocytes Relative 39 %   Lymphs Abs 2.5 0.7 - 4.0 K/uL   Monocytes Relative 8 %   Monocytes Absolute 0.5 0.1 - 1.0 K/uL   Eosinophils Relative 1 %   Eosinophils Absolute 0.1 0.0 - 0.5 K/uL   Basophils Relative 1 %   Basophils Absolute 0.0 0.0 - 0.1 K/uL   Immature Granulocytes 0 %   Abs Immature Granulocytes 0.02 0.00 - 0.07 K/uL  Comprehensive metabolic panel     Status: None   Collection Time: 08/08/21 10:26 PM  Result Value Ref Range   Sodium 136 135 - 145 mmol/L   Potassium 4.0 3.5 - 5.1 mmol/L   Chloride 105 98 - 111 mmol/L   CO2 23 22 - 32 mmol/L   Glucose, Bld 94 70 - 99 mg/dL   BUN 11 6 - 20 mg/dL   Creatinine, Ser 6.29 0.44 - 1.00 mg/dL   Calcium 9.1 8.9 - 47.6 mg/dL   Total Protein 6.9 6.5 - 8.1 g/dL   Albumin 3.8 3.5 - 5.0 g/dL   AST 16 15 - 41 U/L   ALT 14 0 - 44 U/L   Alkaline Phosphatase 62 38 - 126 U/L   Total Bilirubin 0.7 0.3 - 1.2 mg/dL   GFR, Estimated >54 >65 mL/min   Anion gap 8 5 - 15  Lipase, blood     Status: None   Collection Time: 08/08/21 10:26 PM  Result Value Ref Range   Lipase 21 11 - 51 U/L  I-Stat Beta hCG blood, ED ([facility], [facility], [facility] only)     Status: Abnormal   Collection Time: 08/08/21 10:31 PM  Result Value Ref Range   I-stat hCG, quantitative >2,000.0 (H) <5 mIU/mL   Comment 3          Urinalysis, Routine w reflex microscopic Urine, Clean Catch     Status: Abnormal   Collection Time: 08/08/21 11:51 PM  Result Value Ref Range   Color, Urine YELLOW YELLOW   APPearance HAZY (A) CLEAR   Specific Gravity, Urine 1.024 1.005 - 1.030   pH 6.0 5.0 - 8.0   Glucose, UA NEGATIVE NEGATIVE mg/dL   Hgb urine dipstick NEGATIVE  NEGATIVE   Bilirubin Urine NEGATIVE NEGATIVE   Ketones, ur NEGATIVE NEGATIVE mg/dL   Protein, ur NEGATIVE NEGATIVE mg/dL   Nitrite POSITIVE (A) NEGATIVE   Leukocytes,Ua NEGATIVE NEGATIVE   RBC / HPF 0-5 0 - 5 RBC/hpf   WBC,  UA 11-20 0 - 5 WBC/hpf   Bacteria, UA MANY (A) NONE SEEN   Squamous Epithelial / LPF 0-5 0 - 5   Mucus PRESENT   hCG, quantitative, pregnancy     Status: Abnormal   Collection Time: 08/09/21 12:46 AM  Result Value Ref Range   hCG, Beta Chain, Quant, S 30,799 (H) <5 mIU/mL  Wet prep, genital     Status: Abnormal   Collection Time: 08/09/21 12:55 AM  Result Value Ref Range   Yeast Wet Prep HPF POC NONE SEEN NONE SEEN   Trich, Wet Prep NONE SEEN NONE SEEN   Clue Cells Wet Prep HPF POC PRESENT (A) NONE SEEN   WBC, Wet Prep HPF POC MANY (A) NONE SEEN   Sperm NONE SEEN    US OB Comp Less 14 Wks  Result Date: 08/09/2021 CLINICAL DATA:  Pregnant, left lower quadrant abdominal pain EXAM: OBSTETRIC <14 WK [place] AND TRANSVAGINAL OB US TECHNIQUE: Both transabdominal and transvaginal ultrasound examinations were performed for complete evaluation of the gestation as well as the maternal uterus, adnexal regions, and pelvic cul-de-sac. Transvaginal technique was performed to assess early pregnancy. COMPARISON:  None. FINDINGS: Intrauterine gestational sac: Single Yolk sac:  Visualized. Embryo:  Visualized. Cardiac Activity: Visualized. Heart Rate: 114 bpm CRL:  3.6 mm   6 w   0 d                  [place] EDC: 04/04/2022 Subchorionic hemorrhage:  None visualized. Maternal uterus/adnexae: Right ovary is within normal limits. Left ovary is notable for a 5.6 x 5.0 x 5.2 cm simple cyst, likely physiologic. No pelvic ascites. IMPRESSION: Single intrauterine gestation with cardiac activity, measuring 6 weeks 0 days by crown lump length, as above. Electronically Signed   By: Charline Bills M.D.   On: 08/09/2021 01:48    MDM Exam Wet Prep and GC/CT Labs: UA, UPT, CBC, hCG Ultrasound Assessment and Plan  40 year old G6P5005 at 5.4 weeks Abdominal Pain Vaginal Bleeding  -Reviewed POC with patient. -Exam performed.  -Cultures collected blindly by nurse. -Reassured that diagnosis of miscarriage can not be made yet  as further evaluation necessary. -Will send for [place] and await results.   Joyce Copa Chou Busler 08/09/2021, 1:17 AM   Reassessment (2:00 AM) SIUP at 6 weeks +Clue Cells  Results return as above. Provider to bedside to discuss Rx for Metrogel sent to pharmacy on file.  Encouraged to start [facility] Bleeding Precautions Given.  Cherre Robins MSN, CNM Advanced Practice Provider, [facility] [employer]

## 2021-08-10 LAB — GC/CHLAMYDIA PROBE AMP (~~LOC~~) NOT AT ARMC
Chlamydia: NEGATIVE
Comment: NEGATIVE
Comment: NORMAL
Neisseria Gonorrhea: NEGATIVE

## 2021-08-10 LAB — CULTURE, OB URINE: Culture: >=100000 — AB

## 2021-08-18 DIAGNOSIS — R7612 Nonspecific reaction to cell mediated immunity measurement of gamma interferon antigen response without active tuberculosis: Secondary | ICD-10-CM | POA: Diagnosis not present

## 2021-08-18 DIAGNOSIS — Z111 Encounter for screening for respiratory tuberculosis: Secondary | ICD-10-CM | POA: Diagnosis not present

## 2021-08-21 ENCOUNTER — Encounter: Payer: Self-pay | Admitting: Nurse Practitioner

## 2021-08-21 ENCOUNTER — Ambulatory Visit (INDEPENDENT_AMBULATORY_CARE_PROVIDER_SITE_OTHER): Payer: Self-pay | Admitting: Nurse Practitioner

## 2021-08-21 ENCOUNTER — Other Ambulatory Visit: Payer: Self-pay

## 2021-08-21 VITALS — BP 110/68 | HR 81 | Temp 97.5°F | Ht 63.0 in | Wt 294.4 lb

## 2021-08-21 DIAGNOSIS — R432 Parageusia: Secondary | ICD-10-CM

## 2021-08-21 MED ORDER — BIOTENE DRY MOUTH GENTLE MT LIQD: 5.0000 mL | Freq: Four times a day (QID) | OROMUCOSAL | 0 refills | Status: AC

## 2021-08-21 NOTE — Progress Notes (Signed)
[facility] Patient [facility] [address] [place], [place]  [address] Phone:  [phone]   Fax:  [phone]   Established Patient Office Visit  Subjective:  Patient ID: Priscilla Delgado, female    DOB: Feb 20, 1981  Age: 40 y.o. MRN: [id number]  CC:  Chief Complaint  Patient presents with   Follow-up    Pt is here today for her  follow up visit. Pt states that she is [redacted] weeks pregnant and she is thirsty all the time.    HPI Priscilla Delgado presents for follow up. She was seen one year ago for constipation. She  has a past medical history of Ulcer of the stomach and intestine.   She feels like her food does taste normal. She is [redacted] weeks pregnant. She has a sore taste in her mouth. She denies any nausea or vomiting. She was seen in the ED for bleeding. She denies any constipation or diarrhea. She is wanting to  take some medication to help her to loose her appetite.   Past Medical History:  Diagnosis Date   Ulcer of the stomach and intestine     Past Surgical History:  Procedure Laterality Date   CESAREAN SECTION      History reviewed. No pertinent family history.  Social History   Socioeconomic History   Marital status: Single    Spouse name: Not on file   Number of children: Not on file   Years of education: Not on file   Highest education level: Not on file  Occupational History   Not on file  Tobacco Use   Smoking status: Never   Smokeless tobacco: Never  Vaping Use   Vaping Use: Never used  Substance and Sexual Activity   Alcohol use: Yes    Alcohol/week: 1.0 standard drink    Types: 1 Glasses of wine per week    Comment: sometimes on weekends   Drug use: Never   Sexual activity: Yes    Birth control/protection: None  Other Topics Concern   Not on file  Social History Narrative   ** Merged History Encounter **       Social Determinants of Health   Financial Resource Strain: Not on file  Food Insecurity: Not on file  Transportation Needs: Not on  file  Physical Activity: Not on file  Stress: Not on file  Social Connections: Not on file  Intimate Partner Violence: Not on file    Outpatient Medications Prior to Visit  Medication Sig Dispense Refill   acetaminophen (TYLENOL) 500 MG tablet Take 500 mg by mouth every 6 (six) hours as needed.     cefadroxil (DURICEF) 500 MG capsule Take 1 capsule (500 mg total) by mouth 2 (two) times daily. (Patient not taking: Reported on 08/21/2021) 7 capsule 0   metroNIDAZOLE (METROGEL VAGINAL) 0.75 % vaginal gel Place 1 Applicatorful vaginally at bedtime. Insert one applicator, at bedtime, for 5 nights. (Patient not taking: Reported on 08/21/2021) 70 g 0   Prenatal Vit-Fe Fumarate-FA (PRENATAL COMPLETE) 14-0.4 MG TABS Take 1 tablet by mouth daily. (Patient not taking: Reported on 08/21/2021) 30 each 2   No facility-administered medications prior to visit.    No Known Allergies  ROS Review of Systems    Objective:    Physical Exam Constitutional:      General: She is not in acute distress.    Appearance: She is obese. She is not ill-appearing or toxic-appearing.  HENT:     Head: Normocephalic and  atraumatic.     Nose: Nose normal.     Mouth/Throat:     Mouth: Mucous membranes are moist.  Cardiovascular:     Rate and Rhythm: Normal rate and regular rhythm.     Pulses: Normal pulses.     Heart sounds: Normal heart sounds.  Pulmonary:     Effort: Pulmonary effort is normal.     Breath sounds: Normal breath sounds.  Abdominal:     Palpations: Abdomen is soft.  Musculoskeletal:        General: No tenderness.     Cervical back: Normal range of motion.  Skin:    General: Skin is warm and dry.     Capillary Refill: Capillary refill takes less than 2 seconds.  Neurological:     General: No focal deficit present.     Mental Status: She is alert and oriented to person, place, and time.    BP 110/68   Pulse 81   Temp (!) 97.5 F (36.4 C)   Ht 5\' 3"  (1.6 m)   Wt 294 lb 6.4 oz (133.5  kg)   LMP 07/01/2021   SpO2 98%   BMI 52.15 kg/m  Wt Readings from Last 3 Encounters:  08/21/21 294 lb 6.4 oz (133.5 kg)  08/09/21 296 lb (134.3 kg)  07/04/20 297 lb (134.7 kg)     Health Maintenance Due  Topic Date Due   COVID-19 Vaccine (1) Never done   Hepatitis C Screening  Never done    There are no preventive care reminders to display for this patient.  Lab Results  Component Value Date   TSH 2.990 03/10/2020   Lab Results  Component Value Date   WBC 6.5 08/08/2021   HGB 13.4 08/08/2021   HCT 40.4 08/08/2021   MCV 90.6 08/08/2021   PLT 230 08/08/2021   Lab Results  Component Value Date   NA 136 08/08/2021   K 4.0 08/08/2021   CO2 23 08/08/2021   GLUCOSE 94 08/08/2021   BUN 11 08/08/2021   CREATININE 0.83 08/08/2021   BILITOT 0.7 08/08/2021   ALKPHOS 62 08/08/2021   AST 16 08/08/2021   ALT 14 08/08/2021   PROT 6.9 08/08/2021   ALBUMIN 3.8 08/08/2021   CALCIUM 9.1 08/08/2021   ANIONGAP 8 08/08/2021   Lab Results  Component Value Date   CHOL 152 12/15/2016   Lab Results  Component Value Date   HDL 50 (L) 12/15/2016   Lab Results  Component Value Date   LDLCALC 91 12/15/2016   Lab Results  Component Value Date   TRIG 56 12/15/2016   Lab Results  Component Value Date   CHOLHDL 3.0 12/15/2016   Lab Results  Component Value Date   HGBA1C 4.9 11/26/2019      Assessment & Plan:   Problem List Items Addressed This Visit   None Visit Diagnoses     Abnormal taste in mouth    -  Primary Persistent Mouth wash   Morbid obesity (HCC)   (Chronic)   Desires to loose weight during pregnancy        Meds ordered this encounter  Medications   Mouthwashes (BIOTENE DRY MOUTH GENTLE) LIQD    Sig: Use as directed 5 mLs in the mouth or throat 4 (four) times daily.    Dispense:  473 mL    Refill:  0    Order Specific Question:   Supervising Provider    Answer:   01/24/2020 Quentin Angst  Follow-up: Return in about 6 months (around  02/19/2022).    Barbette Merino, NP

## 2021-08-21 NOTE — Patient Instructions (Addendum)
[facility] [address], [place], [place] [address] Phone: [phone]   Prenatal Care Prenatal care is health care during pregnancy. It helps you and your unborn baby (fetus) stay as healthy as possible. Prenatal care may be provided by a midwife, a family practice doctor, a Dispensing optician (nurse practitioner or physician assistant), or a childbirth and pregnancy doctor (obstetrician). How does this affect me? During pregnancy, you will be closely monitored for any new conditions that might develop. To lower your risk of pregnancy complications, you and your health care provider will talk about any underlying conditions you have. How does this affect my baby? Early and consistent prenatal care increases the chance that your baby will be healthy during pregnancy. Prenatal care lowers the risk that your baby will be: Born early (prematurely). Smaller than expected at birth (small for gestational age). What can I expect at the first prenatal care visit? Your first prenatal care visit will likely be the longest. You should schedule your first prenatal care visit as soon as you know that you are pregnant. Your first visit is a good time to talk about any questions or concerns you have about pregnancy. Medical history At your visit, you and your health care provider will talk about your medical history, including: Any past pregnancies. Your family's medical history. Medical history of the baby's father. Any long-term (chronic) health conditions you have and how you manage them. Any surgeries or procedures you have had. Any current over-the-counter or prescription medicines, herbs, or supplements that you are taking. Other factors that could pose a risk to your baby, including: Exposure to harmful chemicals or radiation at work or at home. Any substance use, including tobacco, alcohol, and drug use. Your home setting and your stress levels, including: Exposure to abuse or  violence. Household financial strain. Your daily health habits, including diet and exercise. Tests and screenings Your health care provider will: Measure your weight, height, and blood pressure. Do a physical exam, including a pelvic and breast exam. Perform blood tests and urine tests to check for: Urinary tract infection. Sexually transmitted infections (STIs). Low iron levels in your blood (anemia). Blood type and certain proteins on red blood cells (Rh antibodies). Infections and immunity to viruses, such as hepatitis B and rubella. HIV (human immunodeficiency virus). Discuss your options for genetic screening. Tips about staying healthy Your health care provider will also give you information about how to keep yourself and your baby healthy, including: Nutrition and taking vitamins. Physical activity. How to manage pregnancy symptoms such as nausea and vomiting (morning sickness). Infections and substances that may be harmful to your baby and how to avoid them. Food safety. Dental care. Working. Travel. Warning signs to watch for and when to call your health care provider. How often will I have prenatal care visits? After your first prenatal care visit, you will have regular visits throughout your pregnancy. The visit schedule is often as follows: Up to week 28 of pregnancy: once every 4 weeks. 28-36 weeks: once every 2 weeks. After 36 weeks: every week until delivery. Some women may have visits more or less often depending on any underlying health conditions and the health of the baby. Keep all follow-up and prenatal care visits. This is important. What happens during routine prenatal care visits? Your health care provider will: Measure your weight and blood pressure. Check for fetal heart sounds. Measure the height of your uterus in your abdomen (fundal height). This may be measured starting around week 20  of pregnancy. Check the position of your baby inside your  uterus. Ask questions about your diet, sleeping patterns, and whether you can feel the baby move. Review warning signs to watch for and signs of labor. Ask about any pregnancy symptoms you are having and how you are dealing with them. Symptoms may include: Headaches. Nausea and vomiting. Vaginal discharge. Swelling. Fatigue. Constipation. Changes in your vision. Feeling persistently sad or anxious. Any discomfort, including back or pelvic pain. Bleeding or spotting. Make a list of questions to ask your health care provider at your routine visits. What tests might I have during prenatal care visits? You may have blood, urine, and imaging tests throughout your pregnancy, such as: Urine tests to check for glucose, protein, or signs of infection. Glucose tests to check for a form of diabetes that can develop during pregnancy (gestational diabetes mellitus). This is usually done around week 24 of pregnancy. Ultrasounds to check your baby's growth and development, to check for birth defects, and to check your baby's well-being. These can also help to decide when you should deliver your baby. A test to check for group B strep (GBS) infection. This is usually done around week 36 of pregnancy. Genetic testing. This may include blood, fluid, or tissue sampling, or imaging tests, such as an ultrasound. Some genetic tests are done during the first trimester and some are done during the second trimester. What else can I expect during prenatal care visits? Your health care provider may recommend getting certain vaccines during pregnancy. These may include: A yearly flu shot (annual influenza vaccine). This is especially important if you will be pregnant during flu season. Tdap (tetanus, diphtheria, pertussis) vaccine. Getting this vaccine during pregnancy can protect your baby from whooping cough (pertussis) after birth. This vaccine may be recommended between weeks 27 and 36 of pregnancy. A COVID-19  vaccine. Later in your pregnancy, your health care provider may give you information about: Childbirth and breastfeeding classes. Choosing a health care provider for your baby. Umbilical cord banking. Breastfeeding. Birth control after your baby is born. The hospital labor and delivery unit and how to set up a tour. Registering at the hospital before you go into labor. Where to find more information Office on Women's Health: TravelLesson.ca American Pregnancy Association: americanpregnancy.org March of Dimes: marchofdimes.org Summary Prenatal care helps you and your baby stay as healthy as possible during pregnancy. Your first prenatal care visit will most likely be the longest. You will have visits and tests throughout your pregnancy to monitor your health and your baby's health. Bring a list of questions to your visits to ask your health care provider. Make sure to keep all follow-up and prenatal care visits. This information is not intended to replace advice given to you by your health care provider. Make sure you discuss any questions you have with your health care provider. Document Revised: 07/24/2020 Document Reviewed: 07/24/2020 Elsevier Patient Education  2022 Elsevier Inc.   Prenatal Vitamin and Mineral Combinations (oral solid dosage forms) What is this medication? PRENATAL VITAMIN AND MINERAL combinations are used before, during, and after pregnancy to help provide provide good nutrition.  What should I tell my care team before I take this medication? They need to know if you have any of these conditions: bleeding or clotting disorder history of anemia of any type other chronic health condition an unusual or allergic reaction to vitamins, minerals, other medicines, foods, dyes, or preservatives How should I use this medication? Take this medicine by mouth  with a glass of water. You can take it with or without food. If it upsets your stomach, take it with food.  Chewable prenatal vitamin tablets may be chewed completely before swallowing. Follow the directions on the prescription label. The usual dose is taken once a day. Do not take your medicine more often than directed. Contact your pediatrician regarding the use of this medicine in children. Special care may be needed. This medicine is intended for females who are pregnant, breast-feeding, or may become pregnant. Overdosage: If you think you have taken too much of this medicine contact a poison control center or emergency room at once. NOTE: This medicine is only for you. Do not share this medicine with others. What if I miss a dose? If you miss a dose, take it as soon as you can. If it is almost time for your next dose, take only that dose. Do not take double or extra doses. What may interact with this medication? alendronate antacids cefdinir cefditoren etidronate fluoroquinolone antibiotics (examples: ciprofloxacin, gatifloxacin, levofloxacin) ibandronate levodopa risedronate tetracycline antibiotics (examples: doxycycline, minocycline, tetracycline) thyroid hormones warfarin This list may not describe all possible interactions. Give your health care provider a list of all the medicines, herbs, non-prescription drugs, or dietary supplements you use. Also tell them if you smoke, drink alcohol, or use illegal drugs. Some items may interact with your medicine. What should I watch for while using this medication? See your health care professional for regular checks on your progress. Remember that vitamin and mineral supplements do not replace the need for good nutrition from a balanced diet. Stools commonly change color when vitamins and minerals are taken. Notify your health care professional if this change is alarming or accompanied by other symptoms, like abdominal pain. What side effects may I notice from receiving this medication? Side effects that you should report to your doctor or health  care professional as soon as possible: allergic reaction such as skin rash or difficulty breathing vomiting Side effects that usually do not require medical attention (report to your doctor or health care professional if they continue or are bothersome): nausea stomach upset This list may not describe all possible side effects. Call your doctor for medical advice about side effects. You may report side effects to FDA at 1-800-FDA-1088. Where should I keep my medication? Keep out of the reach of children. Most vitamins and minerals should be stored at controlled room temperature. Check your specific product directions. Protect from heat and moisture. Throw away any unused medicine after the expiration date. NOTE: This sheet is a summary. It may not cover all possible information. If you have questions about this medicine, talk to your doctor, pharmacist, or health care provider.  2022 Elsevier/Gold Standard (2017-11-29 09:17:32)

## 2021-10-06 ENCOUNTER — Telehealth (INDEPENDENT_AMBULATORY_CARE_PROVIDER_SITE_OTHER): Payer: Medicaid Other | Admitting: *Deleted

## 2021-10-06 DIAGNOSIS — O99210 Obesity complicating pregnancy, unspecified trimester: Secondary | ICD-10-CM

## 2021-10-06 DIAGNOSIS — O0990 Supervision of high risk pregnancy, unspecified, unspecified trimester: Secondary | ICD-10-CM | POA: Insufficient documentation

## 2021-10-06 DIAGNOSIS — O09529 Supervision of elderly multigravida, unspecified trimester: Secondary | ICD-10-CM

## 2021-10-06 NOTE — Progress Notes (Signed)
2:08 Patient not connected virtually and does not have MyChart.. I called her mobile number and heard a message voicemail not set up.  Jayant Kriz,RN  2:14 I called Patient mobile number and again heard a message voicemail not set up. I called her home number and heard a message to call again later. Irva Loser,RN  2:20 I called Patient mobile number and again heard a message voicemail not set up. I called her home number and heard a message to call again later. I called her contact number  for [facility] and heard a message to try my call again later. Will have registrar try to reschedule Malachi Suderman,RN

## 2021-10-13 NOTE — Progress Notes (Signed)
Patient walked into front office to request letter giving permission for dentist to treat tooth pain during pregnancy. Patient recently missed OB intake appt but is an established pt with our practice. Letter given to patient and new OB intake appt offered.

## 2021-10-14 ENCOUNTER — Telehealth (INDEPENDENT_AMBULATORY_CARE_PROVIDER_SITE_OTHER): Payer: Medicaid Other

## 2021-10-14 DIAGNOSIS — O0990 Supervision of high risk pregnancy, unspecified, unspecified trimester: Secondary | ICD-10-CM

## 2021-10-14 DIAGNOSIS — Z3A00 Weeks of gestation of pregnancy not specified: Secondary | ICD-10-CM

## 2021-10-14 DIAGNOSIS — Z136 Encounter for screening for cardiovascular disorders: Secondary | ICD-10-CM

## 2021-10-14 DIAGNOSIS — O09899 Supervision of other high risk pregnancies, unspecified trimester: Secondary | ICD-10-CM

## 2021-10-14 MED ORDER — BLOOD PRESSURE MONITORING DEVI: 1.0000 | 0 refills | Status: DC

## 2021-10-14 NOTE — Progress Notes (Signed)
New OB Intake  I connected with  Priscilla Delgado on 10/14/21 at 10:15 AM EST by telephone Video Visit and verified that I am speaking with the correct person using two identifiers. Nurse is located at [facility] and pt is located at home.  I discussed the limitations, risks, security and privacy concerns of performing an evaluation and management service by telephone and the availability of in person appointments. I also discussed with the patient that there may be a patient responsible charge related to this service. The patient expressed understanding and agreed to proceed.  I explained I am completing New OB Intake today. We discussed her EDD of 04/07/22 that is based on LMP of 07/01/21. Pt is G6/P5. I reviewed her allergies, medications, Medical/Surgical/OB history, and appropriate screenings. I informed her of [facility] services. Based on history, this is a/an  pregnancy complicated by AMA&C/S  .   Patient Active Problem List   Diagnosis Date Noted   Supervision of high risk pregnancy, antepartum 10/06/2021   Subclinical hypothyroidism 11/28/2019   BMI 40.0-44.9, adult (HCC) 11/26/2019   History of ulcer disease    Language barrier 11/07/2019   Advanced maternal age in multigravida 02/19/2019   Maternal obesity affecting pregnancy, antepartum 02/19/2019   History of VBAC 01/30/2015    Concerns addressed today  Delivery Plans:  Plans to deliver at [facility].   MyChart/Babyscripts MyChart access verified. I explained pt will have some visits in office and some virtually. Babyscripts instructions given and order placed. Patient verifies receipt of registration text/e-mail. Account successfully created and app downloaded.  Blood Pressure Cuff  Blood pressure cuff ordered for patient to pick-up from [employer]. Explained after first prenatal appt pt will check weekly and document in Babyscripts.  Weight scale: Patient    have weight scale. Weight scale ordered for patient to pick up form  [facility].   Anatomy US Explained first scheduled [place] will be around 19 weeks. Anatomy US scheduled for 11/09/21 at 7:45a. Pt notified to arrive at 7:30a.  Labs Discussed Avelina Laine genetic screening with patient. Would like both Panorama and Horizon drawn at new OB visit. Routine prenatal labs needed.  Covid Vaccine Patient has covid vaccine.   Centering in Pregnancy Candidate?  If yes, offer as possibility  Mother/ Baby Dyad Candidate?    If yes, offer as possibility  Informed patient of [facility] website  and placed link in her AVS.   Social Determinants of Health Food Insecurity: Patient denies food insecurity. WIC Referral: Patient is interested in referral to [facility]  Transportation: Patient denies transportation needs. Childcare: Discussed no children allowed at ultrasound appointments. Offered childcare services; patient declines childcare services at this time.  Send link to Pregnancy Navigators   Placed OB Box on problem list and updated  First visit review I reviewed new OB appt with pt. I explained she will have a pelvic exam, ob bloodwork with genetic screening, and PAP smear. Explained pt will be seen by Dr. Adrian Blackwater at first visit; encounter routed to appropriate provider. Explained that patient will be seen by pregnancy navigator following visit with provider. [facility] information placed in AVS.   [name], CMA 10/14/2021  10:39 AM

## 2021-10-14 NOTE — Progress Notes (Signed)
Chart reviewed - agree with CMA/RN documentation.  ° °

## 2021-10-14 NOTE — Patient Instructions (Signed)
  At our [facility], we work as an integrated team, providing care to address both physical and emotional health. Your medical provider may refer you to see our Behavioral Health Clinician (BHC) on the same day you see your medical provider, as availability permits; often scheduled virtually at your convenience.  Our BHC is available to all patients, visits generally last between 20-30 minutes, but can be longer or shorter, depending on patient need. The BHC offers help with stress management, coping with symptoms of depression and anxiety, major life changes , sleep issues, changing risky behavior, grief and loss, life stress, working on personal life goals, and  behavioral health issues, as these all affect your overall health and wellness.  The BHC is NOT available for the following: FMLA paperwork, court-ordered evaluations, specialty assessments (custody or disability), letters to employers, or obtaining certification for an emotional support animal. The BHC does not provide long-term therapy. You have the right to refuse integrated behavioral health services, or to reschedule to see the BHC at a later date.  Confidentiality exception: If it is suspected that a child or disabled adult is being abused or neglected, we are required by law to report that to either Child Protective Services or Adult Protective Services.  If you have a diagnosis of Bipolar affective disorder, Schizophrenia, or recurrent Major depressive disorder, we will recommend that you establish care with a psychiatrist, as these are lifelong, chronic conditions, and we want your overall emotional health and medications to be more closely monitored. If you anticipate needing extended maternity leave due to mental health issues postpartum, it it recommended you inform your medical provider, so we can put in a referral to a psychiatrist as soon as possible. The BHC is unable to recommend an extended maternity leave for mental  health issues. Your medical provider or BHC may refer you to a therapist for ongoing, traditional therapy, or to a psychiatrist, for medication management, if it would benefit your overall health. Depending on your insurance, you may have a copay or be charged a deductible, depending on your insurance, to see the BHC. If you are uninsured, it is recommended that you apply for financial assistance. (Forms may be requested at the front desk for in-person visits, via MyChart, or request a form during a virtual visit).  If you see the BHC more than 6 times, you will have to complete a comprehensive clinical assessment interview with the BHC to resume integrated services.  For virtual visits with the BHC, you must be physically in the state of [place] at the time of the visit. For example, if you live in [place], you will have to do an in-person visit with the BHC, and your out-of-state insurance may not cover behavioral health services in [place]. If you are going out of the state or country for any reason, the BHC may see you virtually when you return to [place], but not while you are physically outside of [place].    

## 2021-10-25 NOTE — L&D Delivery Note (Signed)
OB/GYN Faculty Practice Delivery Note  Priscilla Delgado is a 41 y.o. [id number] s/p SVD at [redacted]w[redacted]d. She was admitted for pre-term labor at [redacted]w[redacted]d.   ROM: 0h 58m with clear fluid GBS Status: unknown, negative in previous pregnancies, received one dose of Amp shortly before delivery   Maximum Maternal Temperature: 98.41F  Labor Progress: Initial SVE: 5/70/-1. She then progressed to complete with expectant management only.   Delivery Date/Time: 5/21 at 0009  Delivery: Called to room as patient was complete and feeling the urge to push after SROM. Head delivered L OA. No nuchal cord present. Initially encountered difficulty delivering the anterior shoulder but after placing into [place], the shoulder became free and released the left compound arm that was present. Remainder of body delivered in usual fashion. Infant with spontaneous cry, placed on mother's abdomen, dried and stimulated. Cord clamped x 2 after 1-minute delay, and cut by FOB. Cord blood drawn. Placenta delivered spontaneously with gentle cord traction. Fundus firm with massage, lower uterine sweep, and Pitocin. Prophylactic TXA was given. Labia, perineum, vagina, and cervix inspected with no lacerations.  Baby Weight: 3260g  Placenta: 3 vessel, intact. Sent to pathology.  Complications: None Lacerations: None EBL: 100 mL Analgesia: Epidural   Infant:  APGAR (1 MIN): 8   APGAR (5 MINS): 9    Leticia Penna, DO  OB Family Medicine Fellow, [facility] for [facility], [facility] 03/14/2022, 12:55 AM

## 2021-10-28 ENCOUNTER — Other Ambulatory Visit (HOSPITAL_COMMUNITY)
Admission: RE | Admit: 2021-10-28 | Discharge: 2021-10-28 | Disposition: A | Discharge status: Home / Self Care | Payer: Medicaid Other | Source: Ambulatory Visit | Attending: Family Medicine | Admitting: Family Medicine

## 2021-10-28 ENCOUNTER — Other Ambulatory Visit: Payer: Self-pay

## 2021-10-28 ENCOUNTER — Ambulatory Visit (INDEPENDENT_AMBULATORY_CARE_PROVIDER_SITE_OTHER): Payer: Medicaid Other | Admitting: Obstetrics and Gynecology

## 2021-10-28 VITALS — BP 106/60 | HR 69 | Wt 293.2 lb

## 2021-10-28 DIAGNOSIS — Z789 Other specified health status: Secondary | ICD-10-CM

## 2021-10-28 DIAGNOSIS — O0990 Supervision of high risk pregnancy, unspecified, unspecified trimester: Secondary | ICD-10-CM | POA: Diagnosis not present

## 2021-10-28 DIAGNOSIS — Z3A17 17 weeks gestation of pregnancy: Secondary | ICD-10-CM | POA: Diagnosis not present

## 2021-10-28 DIAGNOSIS — Z98891 History of uterine scar from previous surgery: Secondary | ICD-10-CM

## 2021-10-28 DIAGNOSIS — Z23 Encounter for immunization: Secondary | ICD-10-CM

## 2021-10-28 DIAGNOSIS — O99210 Obesity complicating pregnancy, unspecified trimester: Secondary | ICD-10-CM

## 2021-10-28 DIAGNOSIS — Z31430 Encounter of female for testing for genetic disease carrier status for procreative management: Secondary | ICD-10-CM | POA: Diagnosis not present

## 2021-10-28 DIAGNOSIS — Z6841 Body Mass Index (BMI) 40.0 and over, adult: Secondary | ICD-10-CM

## 2021-10-28 DIAGNOSIS — O09522 Supervision of elderly multigravida, second trimester: Secondary | ICD-10-CM

## 2021-10-28 MED ORDER — ASPIRIN 81 MG PO CHEW: 81.0000 mg | CHEWABLE_TABLET | Freq: Every day | ORAL | 7 refills | Status: DC

## 2021-10-28 NOTE — Progress Notes (Signed)
INITIAL PRENATAL VISIT NOTE  Subjective:  Priscilla Delgado is a 41 y.o. G6P5005 at [redacted]w[redacted]d by early ultrasound being seen today for her initial prenatal visit.  She has an obstetric history significant for c/s x 1 and VBAC x 4. She has a medical history significant for morbid obesity.  Patient reports no complaints.  Contractions: Not present. Vag. Bleeding: None.  Movement: Present. Denies leaking of fluid.    Past Medical History:  Diagnosis Date   Ulcer of the stomach and intestine     Past Surgical History:  Procedure Laterality Date   CESAREAN SECTION      OB History  Gravida Para Term Preterm AB Living  6 5 5  0 0 5  SAB IAB Ectopic Multiple Live Births  0 0 0   5    # Outcome Date GA Lbr Len/2nd Weight Sex Delivery Anes PTL Lv  6 Current           5 Term 03/27/20 [redacted]w[redacted]d 00:24 / 00:01 7 lb 3 oz (3.26 kg) F Vag-Spont None  LIV  4 Term 02/19/19 [redacted]w[redacted]d 14:01 / 00:06 7 lb 9.2 oz (3.435 kg) M VBAC None  LIV  3 Term 03/14/15 [redacted]w[redacted]d 04:09 / 00:15 6 lb 8.9 oz (2.975 kg) M VBAC None  LIV  2 Term 07/03/10 [redacted]w[redacted]d  5 lb 8.2 oz (2.5 kg) F Vag-Spont   LIV  1 Term 10/19/08 [redacted]w[redacted]d  6 lb 9.8 oz (3 kg) M CS-Unspec Spinal  LIV    Obstetric Comments  C/s for breech- leg was coming down    Social History   Socioeconomic History   Marital status: Single    Spouse name: Not on file   Number of children: Not on file   Years of education: Not on file   Highest education level: Not on file  Occupational History   Not on file  Tobacco Use   Smoking status: Never   Smokeless tobacco: Never  Vaping Use   Vaping Use: Never used  Substance and Sexual Activity   Alcohol use: Yes    Alcohol/week: 1.0 standard drink    Types: 1 Glasses of wine per week    Comment: sometimes on weekends   Drug use: Never   Sexual activity: Yes    Birth control/protection: None  Other Topics Concern   Not on file  Social History Narrative   ** Merged History Encounter **       Social Determinants of  Health   Financial Resource Strain: Not on file  Food Insecurity: Not on file  Transportation Needs: Not on file  Physical Activity: Not on file  Stress: Not on file  Social Connections: Not on file    Family History  Problem Relation Age of Onset   ADD / ADHD Neg Hx      Current Outpatient Medications:    amoxicillin (AMOXIL) 500 MG capsule, Take 500 mg by mouth 3 (three) times daily., Disp: , Rfl:    aspirin 81 MG chewable tablet, Chew 1 tablet (81 mg total) by mouth daily., Disp: 30 tablet, Rfl: 7   Prenatal Vit-Fe Fumarate-FA (PRENATAL COMPLETE) 14-0.4 MG TABS, Take 1 tablet by mouth daily., Disp: 30 each, Rfl: 2   acetaminophen (TYLENOL) 500 MG tablet, Take 500 mg by mouth every 6 (six) hours as needed., Disp: , Rfl:    Blood Pressure Monitoring DEVI, 1 each by Does not apply route once a week., Disp: 1 each, Rfl: 0   cefadroxil (DURICEF) 500  MG capsule, Take 1 capsule (500 mg total) by mouth 2 (two) times daily. (Patient not taking: Reported on 08/21/2021), Disp: 7 capsule, Rfl: 0   metroNIDAZOLE (METROGEL VAGINAL) 0.75 % vaginal gel, Place 1 Applicatorful vaginally at bedtime. Insert one applicator, at bedtime, for 5 nights. (Patient not taking: Reported on 08/21/2021), Disp: 70 g, Rfl: 0   Mouthwashes (BIOTENE DRY MOUTH GENTLE) LIQD, Use as directed 5 mLs in the mouth or throat 4 (four) times daily. (Patient not taking: Reported on 10/14/2021), Disp: 473 mL, Rfl: 0  No Known Allergies  Review of Systems: Negative except for what is mentioned in HPI.  Objective:   Vitals:   10/28/21 1354 10/28/21 1425  BP: (!) 162/131 106/60  Pulse: 85 69  Weight: 293 lb 3.4 oz (133 kg)     Fetal Status: Fetal Heart Rate (bpm): 151 Fundal Height: 18 cm Movement: Present     Physical Exam: BP 106/60    Pulse 69    Wt 293 lb 3.4 oz (133 kg)    LMP 07/01/2021 (Approximate)    BMI 51.94 kg/m  CONSTITUTIONAL: Well-developed, well-nourished female in no acute distress.  NEUROLOGIC: Alert  and oriented to person, place, and time. Normal reflexes, muscle tone coordination. No cranial nerve deficit noted. PSYCHIATRIC: Normal mood and affect. Normal behavior. Normal judgment and thought content. SKIN: Skin is warm and dry. No rash noted. Not diaphoretic. No erythema. No pallor. HENT:  Normocephalic, atraumatic, External right and left ear normal. Oropharynx is clear and moist EYES: Conjunctivae and EOM are normal.  NECK: Normal range of motion, supple, no masses CARDIOVASCULAR: Normal heart rate noted, regular rhythm RESPIRATORY: Effort and breath sounds normal, no problems with respiration noted BREASTS: deferred ABDOMEN: Soft, nontender, nondistended, gravid, obese GU: normal appearing external female genitalia, multiparous, normal appearing cervix, scant white discharge in vagina, no lesions noted Bimanual: 17 weeks sized uterus, no adnexal tenderness or palpable lesions noted MUSCULOSKELETAL: Normal range of motion. EXT:  No edema and no tenderness. 2+ distal pulses.   Assessment and Plan:  Pregnancy: G6P5005 at [redacted]w[redacted]d by early u/s  1. [redacted] weeks gestation of pregnancy   2. History of VBAC Pt desires VBAC this pregnancy  3. Multigravida of advanced maternal age in second trimester Testing per MFM  4. Maternal obesity affecting pregnancy, antepartum Consider early 2 hour GTT pending A1c  5. Language barrier Pt speaks some English  6. BMI 50.0-59.9, adult (HCC)   7. Supervision of high risk pregnancy, antepartum Continue routine care Start baby ASA due to several risk factors for preeclampsia  - Genetic Screening - GC/Chlamydia probe amp ([place])not at [facility] - Hemoglobin A1c - Culture, OB Urine - CBC/D/Plt+RPR+Rh+ABO+RubIgG... - AFP, Serum, Open Spina Bifida - aspirin 81 MG chewable tablet; Chew 1 tablet (81 mg total) by mouth daily.  Dispense: 30 tablet; Refill: 7  8. Need for immunization against influenza  - Flu Vaccine QUAD 58mo+IM (Fluarix,  Fluzone & Alfiuria Quad PF)   Preterm labor symptoms and general obstetric precautions including but not limited to vaginal bleeding, contractions, leaking of fluid and fetal movement were reviewed in detail with the patient.  Please refer to After Visit Summary for other counseling recommendations.   Return in about 4 weeks (around 11/25/2021) for [facility], in person.  Warden Fillers 10/28/2021 9:34 PM

## 2021-10-28 NOTE — Progress Notes (Deleted)
INITIAL PRENATAL VISIT NOTE  Subjective:  Priscilla Delgado is a 41 y.o. [id number] at [redacted]w[redacted]d by ***LMP being seen today for her initial prenatal visit. This is a ***planned pregnancy.  She was using *** for birth control previously. She has an obstetric history significant for ***. She has a medical history significant for ***.  Patient reports {sx:[id number]}.  Contractions: Not present. Vag. Bleeding: None.  Movement: Present. Denies leaking of fluid.    Past Medical History:  Diagnosis Date   Ulcer of the stomach and intestine     Past Surgical History:  Procedure Laterality Date   CESAREAN SECTION      OB History  Gravida Para Term Preterm AB Living  6 5 5  0 0 5  SAB IAB Ectopic Multiple Live Births  0 0 0   5    # Outcome Date GA Lbr Len/2nd Weight Sex Delivery Anes PTL Lv  6 Current           5 Term 03/27/20 [redacted]w[redacted]d 00:24 / 00:01 7 lb 3 oz (3.26 kg) F Vag-Spont None  LIV  4 Term 02/19/19 [redacted]w[redacted]d 14:01 / 00:06 7 lb 9.2 oz (3.435 kg) M VBAC None  LIV  3 Term 03/14/15 [redacted]w[redacted]d 04:09 / 00:15 6 lb 8.9 oz (2.975 kg) M VBAC None  LIV  2 Term 07/03/10 [redacted]w[redacted]d  5 lb 8.2 oz (2.5 kg) F Vag-Spont   LIV  1 Term 10/19/08 [redacted]w[redacted]d  6 lb 9.8 oz (3 kg) M CS-Unspec Spinal  LIV    Obstetric Comments  C/s for breech- leg was coming down    Social History   Socioeconomic History   Marital status: Single    Spouse name: Not on file   Number of children: Not on file   Years of education: Not on file   Highest education level: Not on file  Occupational History   Not on file  Tobacco Use   Smoking status: Never   Smokeless tobacco: Never  Vaping Use   Vaping Use: Never used  Substance and Sexual Activity   Alcohol use: Yes    Alcohol/week: 1.0 standard drink    Types: 1 Glasses of wine per week    Comment: sometimes on weekends   Drug use: Never   Sexual activity: Yes    Birth control/protection: None  Other Topics Concern   Not on file  Social History Narrative   ** Merged History Encounter  **       Social Determinants of Health   Financial Resource Strain: Not on file  Food Insecurity: Not on file  Transportation Needs: Not on file  Physical Activity: Not on file  Stress: Not on file  Social Connections: Not on file    Family History  Problem Relation Age of Onset   ADD / ADHD Neg Hx      Current Outpatient Medications:    amoxicillin (AMOXIL) 500 MG capsule, Take 500 mg by mouth 3 (three) times daily., Disp: , Rfl:    Prenatal Vit-Fe Fumarate-FA (PRENATAL COMPLETE) 14-0.4 MG TABS, Take 1 tablet by mouth daily., Disp: 30 each, Rfl: 2   acetaminophen (TYLENOL) 500 MG tablet, Take 500 mg by mouth every 6 (six) hours as needed., Disp: , Rfl:    Blood Pressure Monitoring DEVI, 1 each by Does not apply route once a week., Disp: 1 each, Rfl: 0   cefadroxil (DURICEF) 500 MG capsule, Take 1 capsule (500 mg total) by mouth 2 (two) times daily. (Patient not taking:  Reported on 08/21/2021), Disp: 7 capsule, Rfl: 0   metroNIDAZOLE (METROGEL VAGINAL) 0.75 % vaginal gel, Place 1 Applicatorful vaginally at bedtime. Insert one applicator, at bedtime, for 5 nights. (Patient not taking: Reported on 08/21/2021), Disp: 70 g, Rfl: 0   Mouthwashes (BIOTENE DRY MOUTH GENTLE) LIQD, Use as directed 5 mLs in the mouth or throat 4 (four) times daily. (Patient not taking: Reported on 10/14/2021), Disp: 473 mL, Rfl: 0  No Known Allergies  Review of Systems: Negative except for what is mentioned in HPI.  Objective:   Vitals:   10/28/21 1354 10/28/21 1425  BP: (!) 162/131 106/60  Pulse: 85 69  Weight: 293 lb 3.4 oz (133 kg)     Fetal Status: Fetal Heart Rate (bpm): 151 Fundal Height: 18 cm Movement: Present     Physical Exam: BP 106/60    Pulse 69    Wt 293 lb 3.4 oz (133 kg)    LMP 07/01/2021 (Approximate)    BMI 51.94 kg/m  CONSTITUTIONAL: Well-developed, well-nourished female in ***no acute distress.  NEUROLOGIC: Alert and oriented to person, place, and time. Normal reflexes, muscle  tone coordination. No cranial nerve deficit noted. PSYCHIATRIC: Normal mood and affect. Normal behavior. Normal judgment and thought content. SKIN: Skin is warm and dry. No rash noted. Not diaphoretic. No erythema. No pallor. HENT:  Normocephalic, atraumatic, External right and left ear normal. Oropharynx is clear and moist EYES: Conjunctivae and EOM are normal. Pupils are equal, round, and reactive to light. No scleral icterus.  NECK: Normal range of motion, supple, no masses CARDIOVASCULAR: Normal heart rate noted, regular rhythm RESPIRATORY: Effort and breath sounds normal, no problems with respiration noted BREASTS: symmetric, ***non-tender, no masses palpable ABDOMEN: Soft, ***nontender, ***nondistended, gravid. GU: ***normal appearing external female genitalia, ***multiparous***nulliparous ***normal appearing cervix, scant white discharge in vagina, no lesions noted Bimanual: *** weeks sized uterus, no adnexal tenderness or palpable lesions noted MUSCULOSKELETAL: Normal range of motion. EXT:  No edema and no tenderness. 2+ distal pulses.   Assessment and Plan:  Pregnancy: G6P5005 at [redacted]w[redacted]d by ***LMP  1. [redacted] weeks gestation of pregnancy ***  2. History of VBAC ***  3. Multigravida of advanced maternal age in second trimester ***  4. Maternal obesity affecting pregnancy, antepartum ***  5. Language barrier ***  6. BMI 50.0-59.9, adult (HCC) ***  7. Supervision of high risk pregnancy, antepartum *** - Genetic Screening - GC/Chlamydia probe amp ([place])not at [facility] - Hemoglobin A1c - Culture, OB Urine - CBC/D/Plt+RPR+Rh+ABO+RubIgG... - AFP, Serum, Open Spina Bifida   {Blank single:19197::"Term","Preterm"} labor symptoms and general obstetric precautions including but not limited to vaginal bleeding, contractions, leaking of fluid and fetal movement were reviewed in detail with the patient.  Please refer to After Visit Summary for other counseling recommendations.    Return in about 4 weeks (around 11/25/2021) for [facility], in person.  Warden Fillers 10/28/2021 2:48 PM

## 2021-10-29 LAB — GC/CHLAMYDIA PROBE AMP (~~LOC~~) NOT AT ARMC
Chlamydia: NEGATIVE
Comment: NEGATIVE
Comment: NORMAL
Neisseria Gonorrhea: NEGATIVE

## 2021-10-30 LAB — AFP, SERUM, OPEN SPINA BIFIDA
AFP MoM: 1.29
AFP Value: 38.6 ng/mL
Gest. Age on Collection Date: 17 weeks
Maternal Age At EDD: 39.4 yr
OSBR Risk 1 IN: 9894
Test Results:: NEGATIVE
Weight: 293 [lb_av]

## 2021-10-30 LAB — HEMOGLOBIN A1C
Est. average glucose Bld gHb Est-mCnc: 105 mg/dL
Hgb A1c MFr Bld: 5.3 % (ref 4.8–5.6)

## 2021-10-30 LAB — CBC/D/PLT+RPR+RH+ABO+RUBIGG...
Antibody Screen: NEGATIVE
Basophils Absolute: 0 10*3/uL (ref 0.0–0.2)
Basos: 1 %
EOS (ABSOLUTE): 0.1 10*3/uL (ref 0.0–0.4)
Eos: 1 %
HCV Ab: <0.1 s/co ratio (ref 0.0–0.9)
HIV Screen 4th Generation wRfx: NONREACTIVE
Hematocrit: 37.2 % (ref 34.0–46.6)
Hemoglobin: 12.5 g/dL (ref 11.1–15.9)
Hepatitis B Surface Ag: NEGATIVE
Immature Grans (Abs): 0 10*3/uL (ref 0.0–0.1)
Immature Granulocytes: 1 %
Lymphocytes Absolute: 1.4 10*3/uL (ref 0.7–3.1)
Lymphs: 23 %
MCH: 29.8 pg (ref 26.6–33.0)
MCHC: 33.6 g/dL (ref 31.5–35.7)
MCV: 89 fL (ref 79–97)
Monocytes Absolute: 0.3 10*3/uL (ref 0.1–0.9)
Monocytes: 5 %
Neutrophils Absolute: 4.3 10*3/uL (ref 1.4–7.0)
Neutrophils: 69 %
Platelets: 232 10*3/uL (ref 150–450)
RBC: 4.2 x10E6/uL (ref 3.77–5.28)
RDW: 12 % (ref 11.7–15.4)
RPR Ser Ql: NONREACTIVE
Rh Factor: POSITIVE
Rubella Antibodies, IGG: 24 index (ref >0.99)
WBC: 6.2 10*3/uL (ref 3.4–10.8)

## 2021-10-30 LAB — URINE CULTURE, OB REFLEX

## 2021-10-30 LAB — HCV INTERPRETATION

## 2021-10-30 LAB — CULTURE, OB URINE

## 2021-11-03 ENCOUNTER — Telehealth: Payer: Self-pay | Admitting: General Practice

## 2021-11-03 NOTE — Telephone Encounter (Signed)
Patient called into front office requesting a call back concerning results. Called patient and she asked about her blood work results. Informed patient of normal results. Patient verbalized understanding & asked about genetic testing results. Told patient those results are not back yet as they usually take up to 2 weeks to come back. Patient verbalized understanding & had no other questions.

## 2021-11-09 ENCOUNTER — Ambulatory Visit: Payer: Medicaid Other | Admitting: *Deleted

## 2021-11-09 ENCOUNTER — Encounter: Payer: Self-pay | Admitting: Radiology

## 2021-11-09 ENCOUNTER — Other Ambulatory Visit: Payer: Self-pay

## 2021-11-09 ENCOUNTER — Ambulatory Visit: Payer: Medicaid Other

## 2021-11-09 ENCOUNTER — Encounter: Payer: Self-pay | Admitting: *Deleted

## 2021-11-09 ENCOUNTER — Ambulatory Visit: Payer: Medicaid Other | Attending: Family Medicine

## 2021-11-09 VITALS — BP 110/57 | HR 72

## 2021-11-09 DIAGNOSIS — E669 Obesity, unspecified: Secondary | ICD-10-CM | POA: Diagnosis not present

## 2021-11-09 DIAGNOSIS — Z3A19 19 weeks gestation of pregnancy: Secondary | ICD-10-CM | POA: Diagnosis not present

## 2021-11-09 DIAGNOSIS — O99210 Obesity complicating pregnancy, unspecified trimester: Secondary | ICD-10-CM | POA: Diagnosis not present

## 2021-11-09 DIAGNOSIS — O99212 Obesity complicating pregnancy, second trimester: Secondary | ICD-10-CM

## 2021-11-09 DIAGNOSIS — O09529 Supervision of elderly multigravida, unspecified trimester: Secondary | ICD-10-CM

## 2021-11-09 DIAGNOSIS — O09522 Supervision of elderly multigravida, second trimester: Secondary | ICD-10-CM

## 2021-11-09 DIAGNOSIS — O0990 Supervision of high risk pregnancy, unspecified, unspecified trimester: Secondary | ICD-10-CM

## 2021-11-24 ENCOUNTER — Encounter: Payer: Self-pay | Admitting: Family Medicine

## 2021-11-24 ENCOUNTER — Telehealth: Payer: Self-pay | Admitting: Family Medicine

## 2021-11-24 NOTE — Telephone Encounter (Signed)
Patient is requesting a note stating she can return to work, she said the Doctor took her out of work and for her to return she need a note.

## 2021-11-25 NOTE — Telephone Encounter (Addendum)
Called pt and pt states that she took the pregnancy work restriction letter to job that we provided and her employer could not accommodate.  The pt states that her employer told to stay home.  Pt reports that she has been home for two weeks now and wants to go back to work.  Pt states that her employer requested a letter from our office stating that she can return to work without restrictions.  I explained to the pt that we could not provide that letter as we did not take her out of work her employer did.  I advised to pt to speak with Dr. Shawnie Pons at her appt tomorrow to see if a letter could written for her.  Pt verbalized understanding.   Leonette Nutting  11/25/21

## 2021-11-26 ENCOUNTER — Ambulatory Visit (INDEPENDENT_AMBULATORY_CARE_PROVIDER_SITE_OTHER): Payer: Medicaid Other | Admitting: Family Medicine

## 2021-11-26 ENCOUNTER — Other Ambulatory Visit: Payer: Self-pay

## 2021-11-26 VITALS — BP 112/59 | HR 78 | Wt 299.0 lb

## 2021-11-26 DIAGNOSIS — O09522 Supervision of elderly multigravida, second trimester: Secondary | ICD-10-CM

## 2021-11-26 DIAGNOSIS — O99210 Obesity complicating pregnancy, unspecified trimester: Secondary | ICD-10-CM

## 2021-11-26 DIAGNOSIS — Z98891 History of uterine scar from previous surgery: Secondary | ICD-10-CM

## 2021-11-26 DIAGNOSIS — O0990 Supervision of high risk pregnancy, unspecified, unspecified trimester: Secondary | ICD-10-CM

## 2021-11-26 DIAGNOSIS — Z789 Other specified health status: Secondary | ICD-10-CM

## 2021-11-26 NOTE — Progress Notes (Signed)
Patient has elevated PHQ9. Declines BHC.   Alesia Richards, RN 11/26/21

## 2021-11-26 NOTE — Progress Notes (Signed)
Patient follow up ultrasound scheduled for at 12/11/11 at 7:15 AM. Patient notified.

## 2021-11-26 NOTE — Progress Notes (Signed)
° ° °  PRENATAL VISIT NOTE  Subjective:  Priscilla Delgado is a 41 y.o. G6P5005 at [redacted]w[redacted]d being seen today for ongoing prenatal care.  She is currently monitored for the following issues for this high-risk pregnancy and has History of VBAC; Advanced maternal age in multigravida; Maternal obesity affecting pregnancy, antepartum; Language barrier; BMI 50.0-59.9, adult ([place]); History of ulcer disease; Subclinical hypothyroidism; and Supervision of high risk pregnancy, antepartum on their problem list.  Patient reports no complaints.  Contractions: Not present. Vag. Bleeding: None.  Movement: Present. Denies leaking of fluid.   The following portions of the patient's history were reviewed and updated as appropriate: allergies, current medications, past family history, past medical history, past social history, past surgical history and problem list.   Objective:   Vitals:   11/26/21 1322  BP: (!) 112/59  Pulse: 78  Weight: 299 lb (135.6 kg)    Fetal Status: Fetal Heart Rate (bpm): 155   Movement: Present     General:  Alert, oriented and cooperative. Patient is in no acute distress.  Skin: Skin is warm and dry. No rash noted.   Cardiovascular: Normal heart rate noted  Respiratory: Normal respiratory effort, no problems with respiration noted  Abdomen: Soft, gravid, appropriate for gestational age.  Pain/Pressure: Absent     Pelvic: Cervical exam deferred        Extremities: Normal range of motion.  Edema: None  Mental Status: Normal mood and affect. Normal behavior. Normal judgment and thought content.   Assessment and Plan:  Pregnancy: [id number] at [redacted]w[redacted]d 1. History of VBAC Desires again  2. Multigravida of advanced maternal age in second trimester NIPT pending  3. Language barrier Speaks English  4. Supervision of high risk pregnancy, antepartum AFP WNL  5. Maternal obesity affecting pregnancy, antepartum For f/u u/s for growth due to obesity - US MFM OB FOLLOW UP;  Future  Preterm labor symptoms and general obstetric precautions including but not limited to vaginal bleeding, contractions, leaking of fluid and fetal movement were reviewed in detail with the patient. Please refer to After Visit Summary for other counseling recommendations.   Return in 4 weeks (on 12/24/2021).  Future Appointments  Date Time Provider [place]  12/10/2021  7:15 AM [facility] NURSE [facility]  12/10/2021  7:30 AM [facility] US3 [facility]  02/19/2022  1:00 PM Vevelyn Francois, NP [facility] None    Donnamae Jude, MD

## 2021-12-08 ENCOUNTER — Ambulatory Visit: Payer: Medicaid Other

## 2021-12-10 ENCOUNTER — Other Ambulatory Visit: Payer: Self-pay

## 2021-12-10 ENCOUNTER — Ambulatory Visit: Payer: Medicaid Other | Admitting: *Deleted

## 2021-12-10 ENCOUNTER — Other Ambulatory Visit: Payer: Self-pay | Admitting: *Deleted

## 2021-12-10 ENCOUNTER — Ambulatory Visit: Payer: Medicaid Other | Attending: Family Medicine

## 2021-12-10 VITALS — BP 98/43 | HR 76

## 2021-12-10 DIAGNOSIS — O0990 Supervision of high risk pregnancy, unspecified, unspecified trimester: Secondary | ICD-10-CM

## 2021-12-10 DIAGNOSIS — Z3A23 23 weeks gestation of pregnancy: Secondary | ICD-10-CM

## 2021-12-10 DIAGNOSIS — O99212 Obesity complicating pregnancy, second trimester: Secondary | ICD-10-CM

## 2021-12-10 DIAGNOSIS — O99210 Obesity complicating pregnancy, unspecified trimester: Secondary | ICD-10-CM | POA: Diagnosis not present

## 2021-12-10 DIAGNOSIS — O34219 Maternal care for unspecified type scar from previous cesarean delivery: Secondary | ICD-10-CM | POA: Diagnosis not present

## 2021-12-10 DIAGNOSIS — O09522 Supervision of elderly multigravida, second trimester: Secondary | ICD-10-CM

## 2021-12-10 DIAGNOSIS — R638 Other symptoms and signs concerning food and fluid intake: Secondary | ICD-10-CM

## 2021-12-24 ENCOUNTER — Encounter: Payer: Self-pay | Admitting: Obstetrics and Gynecology

## 2021-12-24 ENCOUNTER — Ambulatory Visit (INDEPENDENT_AMBULATORY_CARE_PROVIDER_SITE_OTHER): Payer: Medicaid Other | Admitting: Obstetrics and Gynecology

## 2021-12-24 ENCOUNTER — Encounter: Payer: No Typology Code available for payment source | Admitting: Family Medicine

## 2021-12-24 ENCOUNTER — Other Ambulatory Visit: Payer: Self-pay

## 2021-12-24 VITALS — BP 117/70 | HR 87 | Wt 295.3 lb

## 2021-12-24 DIAGNOSIS — O09522 Supervision of elderly multigravida, second trimester: Secondary | ICD-10-CM

## 2021-12-24 DIAGNOSIS — E038 Other specified hypothyroidism: Secondary | ICD-10-CM

## 2021-12-24 DIAGNOSIS — Z98891 History of uterine scar from previous surgery: Secondary | ICD-10-CM

## 2021-12-24 DIAGNOSIS — O0990 Supervision of high risk pregnancy, unspecified, unspecified trimester: Secondary | ICD-10-CM

## 2021-12-24 NOTE — Patient Instructions (Signed)

## 2021-12-24 NOTE — Progress Notes (Signed)
Subjective:  ?Priscilla Delgado is a 41 y.o. G6P5005 at [redacted]w[redacted]d being seen today for ongoing prenatal care.  She is currently monitored for the following issues for this high-risk pregnancy and has History of VBAC; Advanced maternal age in multigravida; Maternal obesity affecting pregnancy, antepartum; Language barrier; BMI 50.0-59.9, adult ([place]); History of ulcer disease; Subclinical hypothyroidism; and Supervision of high risk pregnancy, antepartum on their problem list. ? ?Patient reports seasonal allergies.  Contractions: Not present. Vag. Bleeding: None.  Movement: Present. Denies leaking of fluid.  ? ?The following portions of the patient's history were reviewed and updated as appropriate: allergies, current medications, past family history, past medical history, past social history, past surgical history and problem list. Problem list updated. ? ?Objective:  ? ?Vitals:  ? 12/24/21 0951  ?BP: 117/70  ?Pulse: 87  ?Weight: 295 lb 4.8 oz (133.9 kg)  ? ? ?Fetal Status: Fetal Heart Rate (bpm): 157 Fundal Height: 25 cm Movement: Present    ? ?General:  Alert, oriented and cooperative. Patient is in no acute distress.  ?Skin: Skin is warm and dry. No rash noted.   ?Cardiovascular: Normal heart rate noted  ?Respiratory: Normal respiratory effort, no problems with respiration noted  ?Abdomen: Soft, gravid, appropriate for gestational age. Pain/Pressure: Absent     ?Pelvic:  Cervical exam deferred        ?Extremities: Normal range of motion.  Edema: None  ?Mental Status: Normal mood and affect. Normal behavior. Normal judgment and thought content.  ? ?Urinalysis:     ? ?Assessment and Plan:  ?Pregnancy: [id number] at [redacted]w[redacted]d ? ?1. Supervision of high risk pregnancy, antepartum ?Stable ?OTC meds for allergies reviewed ?Proof of pregnancy letter provided for travel ?Glucola next visit ? ?2. History of VBAC ?Desires VBAC ?Consent at next OB visit ? ?3. Subclinical hypothyroidism ?Nl TSH 5/21 ?Check with Glucola blood draw ? ?4.  Multigravida of advanced maternal age in second trimester ?Nl genetics ? ?Preterm labor symptoms and general obstetric precautions including but not limited to vaginal bleeding, contractions, leaking of fluid and fetal movement were reviewed in detail with the patient. ?Please refer to After Visit Summary for other counseling recommendations.  ?Return in about 3 weeks (around 01/14/2022) for OB visit, face to face, MD only, fasting for Glucola. ? ? ?Chancy Milroy, MD ?

## 2021-12-24 NOTE — Progress Notes (Signed)
?  April 5th travelling to [place] for 10 days, would like to speak to provider regarding any issues or concerns with travelling ? ?

## 2022-01-07 ENCOUNTER — Ambulatory Visit: Payer: Medicaid Other | Attending: Obstetrics

## 2022-01-07 ENCOUNTER — Ambulatory Visit: Payer: Medicaid Other | Admitting: *Deleted

## 2022-01-07 ENCOUNTER — Other Ambulatory Visit: Payer: Self-pay | Admitting: *Deleted

## 2022-01-07 ENCOUNTER — Other Ambulatory Visit: Payer: Self-pay

## 2022-01-07 VITALS — BP 116/51 | HR 81

## 2022-01-07 DIAGNOSIS — O09522 Supervision of elderly multigravida, second trimester: Secondary | ICD-10-CM

## 2022-01-07 DIAGNOSIS — Z3A27 27 weeks gestation of pregnancy: Secondary | ICD-10-CM

## 2022-01-07 DIAGNOSIS — R638 Other symptoms and signs concerning food and fluid intake: Secondary | ICD-10-CM | POA: Diagnosis not present

## 2022-01-07 DIAGNOSIS — O99212 Obesity complicating pregnancy, second trimester: Secondary | ICD-10-CM | POA: Diagnosis not present

## 2022-01-07 DIAGNOSIS — E669 Obesity, unspecified: Secondary | ICD-10-CM

## 2022-01-07 DIAGNOSIS — O0990 Supervision of high risk pregnancy, unspecified, unspecified trimester: Secondary | ICD-10-CM | POA: Insufficient documentation

## 2022-01-07 DIAGNOSIS — O34219 Maternal care for unspecified type scar from previous cesarean delivery: Secondary | ICD-10-CM | POA: Diagnosis not present

## 2022-01-14 ENCOUNTER — Encounter: Payer: No Typology Code available for payment source | Admitting: Family Medicine

## 2022-01-24 NOTE — Progress Notes (Signed)
? ?  PRENATAL VISIT NOTE ? ?Subjective:  ?Priscilla Delgado is a 41 y.o. G6P5005 at [redacted]w[redacted]d being seen today for ongoing prenatal care.  She is currently monitored for the following issues for this high-risk pregnancy and has History of VBAC; Advanced maternal age in multigravida; Maternal obesity affecting pregnancy, antepartum; Language barrier; BMI 50.0-59.9, adult ([place]); History of ulcer disease; Subclinical hypothyroidism; and Supervision of high risk pregnancy, antepartum on their problem list. ? ?Patient reports no complaints.  Contractions: Not present. Vag. Bleeding: None.  Movement: Present. Denies leaking of fluid.  ? ?The following portions of the patient's history were reviewed and updated as appropriate: allergies, current medications, past family history, past medical history, past social history, past surgical history and problem list.  ? ?Objective:  ? ?Vitals:  ? 01/27/22 1028  ?BP: (!) 106/54  ?Pulse: 76  ?Weight: (!) 303 lb (137.4 kg)  ? ? ?Fetal Status: Fetal Heart Rate (bpm): 154   Movement: Present    ? ?General:  Alert, oriented and cooperative. Patient is in no acute distress.  ?Skin: Skin is warm and dry. No rash noted.   ?Cardiovascular: Normal heart rate noted  ?Respiratory: Normal respiratory effort, no problems with respiration noted  ?Abdomen: Soft, gravid, appropriate for gestational age.  Pain/Pressure: Present     ?Pelvic: Cervical exam deferred        ?Extremities: Normal range of motion.  Edema: Trace  ?Mental Status: Normal mood and affect. Normal behavior. Normal judgment and thought content.  ? ?Assessment and Plan:  ?Pregnancy: [id number] at [redacted]w[redacted]d ?1. History of VBAC ?Desires TOLAC - consent signed today.  ? ?2. Multigravida of advanced maternal age in second trimester ?LR NIPS ?Has [place] on 4/18 ? ?3. Maternal obesity affecting pregnancy, antepartum ? ?4. Supervision of high risk pregnancy, antepartum ?28w labs need to be done ?TDAP recommended - pt thought she had it. Reviewed  immunizations, the box and prior notes. Offer again next appt - she may be recalling the flu shot.  ? ?Preterm labor symptoms and general obstetric precautions including but not limited to vaginal bleeding, contractions, leaking of fluid and fetal movement were reviewed in detail with the patient. ?Please refer to After Visit Summary for other counseling recommendations.  ? ?Return in about 2 weeks (around 02/10/2022) for OB VISIT, MD or APP, 2 hr GTT. ? ?Future Appointments  ?Date Time Provider [place]  ?02/09/2022  8:15 AM [facility] NURSE [facility]  ?02/09/2022  8:30 AM [facility] US2 [facility]  ?02/19/2022  1:40 PM [place], Debbra Riding, NP [facility] None  ? ? ?Radene Gunning, MD ?

## 2022-01-27 ENCOUNTER — Ambulatory Visit (INDEPENDENT_AMBULATORY_CARE_PROVIDER_SITE_OTHER): Payer: Medicaid Other | Admitting: Obstetrics and Gynecology

## 2022-01-27 ENCOUNTER — Other Ambulatory Visit: Payer: Self-pay

## 2022-01-27 VITALS — BP 106/54 | HR 76 | Wt 303.0 lb

## 2022-01-27 DIAGNOSIS — O99210 Obesity complicating pregnancy, unspecified trimester: Secondary | ICD-10-CM

## 2022-01-27 DIAGNOSIS — Z98891 History of uterine scar from previous surgery: Secondary | ICD-10-CM

## 2022-01-27 DIAGNOSIS — O0990 Supervision of high risk pregnancy, unspecified, unspecified trimester: Secondary | ICD-10-CM

## 2022-01-27 DIAGNOSIS — O09522 Supervision of elderly multigravida, second trimester: Secondary | ICD-10-CM

## 2022-01-28 ENCOUNTER — Other Ambulatory Visit: Payer: No Typology Code available for payment source

## 2022-01-29 LAB — CBC
Hematocrit: 34 % (ref 34.0–46.6)
Hemoglobin: 11.4 g/dL (ref 11.1–15.9)
MCH: 30.5 pg (ref 26.6–33.0)
MCHC: 33.5 g/dL (ref 31.5–35.7)
MCV: 91 fL (ref 79–97)
Platelets: 162 10*3/uL (ref 150–450)
RBC: 3.74 x10E6/uL — ABNORMAL LOW (ref 3.77–5.28)
RDW: 13.2 % (ref 11.7–15.4)
WBC: 6.1 10*3/uL (ref 3.4–10.8)

## 2022-01-29 LAB — RPR: RPR Ser Ql: NONREACTIVE

## 2022-01-29 LAB — HIV ANTIBODY (ROUTINE TESTING W REFLEX): HIV Screen 4th Generation wRfx: NONREACTIVE

## 2022-02-01 ENCOUNTER — Other Ambulatory Visit: Payer: No Typology Code available for payment source

## 2022-02-01 DIAGNOSIS — O0990 Supervision of high risk pregnancy, unspecified, unspecified trimester: Secondary | ICD-10-CM | POA: Diagnosis not present

## 2022-02-02 LAB — GLUCOSE TOLERANCE, 2 HOURS W/ 1HR
Glucose, 1 hour: 81 mg/dL (ref 70–179)
Glucose, 2 hour: 93 mg/dL (ref 70–152)
Glucose, Fasting: 89 mg/dL (ref 70–91)

## 2022-02-09 ENCOUNTER — Ambulatory Visit: Payer: Medicaid Other | Admitting: *Deleted

## 2022-02-09 ENCOUNTER — Encounter: Payer: Self-pay | Admitting: *Deleted

## 2022-02-09 ENCOUNTER — Other Ambulatory Visit: Payer: Self-pay | Admitting: *Deleted

## 2022-02-09 ENCOUNTER — Ambulatory Visit: Payer: Medicaid Other | Attending: Obstetrics and Gynecology

## 2022-02-09 VITALS — BP 103/58 | HR 76

## 2022-02-09 DIAGNOSIS — O99212 Obesity complicating pregnancy, second trimester: Secondary | ICD-10-CM | POA: Diagnosis not present

## 2022-02-09 DIAGNOSIS — E669 Obesity, unspecified: Secondary | ICD-10-CM

## 2022-02-09 DIAGNOSIS — O99283 Endocrine, nutritional and metabolic diseases complicating pregnancy, third trimester: Secondary | ICD-10-CM | POA: Diagnosis not present

## 2022-02-09 DIAGNOSIS — O0990 Supervision of high risk pregnancy, unspecified, unspecified trimester: Secondary | ICD-10-CM | POA: Diagnosis not present

## 2022-02-09 DIAGNOSIS — O99213 Obesity complicating pregnancy, third trimester: Secondary | ICD-10-CM | POA: Diagnosis not present

## 2022-02-09 DIAGNOSIS — O34219 Maternal care for unspecified type scar from previous cesarean delivery: Secondary | ICD-10-CM

## 2022-02-09 DIAGNOSIS — E039 Hypothyroidism, unspecified: Secondary | ICD-10-CM | POA: Diagnosis not present

## 2022-02-09 DIAGNOSIS — O09522 Supervision of elderly multigravida, second trimester: Secondary | ICD-10-CM | POA: Diagnosis not present

## 2022-02-09 DIAGNOSIS — O09523 Supervision of elderly multigravida, third trimester: Secondary | ICD-10-CM

## 2022-02-09 DIAGNOSIS — Z6841 Body Mass Index (BMI) 40.0 and over, adult: Secondary | ICD-10-CM

## 2022-02-09 DIAGNOSIS — Z3A32 32 weeks gestation of pregnancy: Secondary | ICD-10-CM

## 2022-02-15 ENCOUNTER — Encounter: Payer: Self-pay | Admitting: Obstetrics and Gynecology

## 2022-02-15 ENCOUNTER — Ambulatory Visit (INDEPENDENT_AMBULATORY_CARE_PROVIDER_SITE_OTHER): Payer: Medicaid Other | Admitting: Obstetrics and Gynecology

## 2022-02-15 VITALS — BP 106/69 | HR 76 | Wt 303.3 lb

## 2022-02-15 DIAGNOSIS — Z98891 History of uterine scar from previous surgery: Secondary | ICD-10-CM

## 2022-02-15 DIAGNOSIS — Z3A33 33 weeks gestation of pregnancy: Secondary | ICD-10-CM

## 2022-02-15 DIAGNOSIS — O0990 Supervision of high risk pregnancy, unspecified, unspecified trimester: Secondary | ICD-10-CM

## 2022-02-15 DIAGNOSIS — O09523 Supervision of elderly multigravida, third trimester: Secondary | ICD-10-CM

## 2022-02-15 NOTE — Patient Instructions (Signed)

## 2022-02-15 NOTE — Progress Notes (Signed)
Patient reports period like pain in pelvis that happened yesterday evening. She stated the pain lasted for roughly 2 hours. Freilich denies any vaginal bleeding or abnormal discharge and reports good movements from baby ?

## 2022-02-15 NOTE — Progress Notes (Signed)
Subjective:  ?Priscilla Delgado is a 41 y.o. G6P5005 at [redacted]w[redacted]d being seen today for ongoing prenatal care.  She is currently monitored for the following issues for this low-risk pregnancy and has History of VBAC; Advanced maternal age in multigravida; Maternal obesity affecting pregnancy, antepartum; Language barrier; BMI 50.0-59.9, adult ([place]); History of ulcer disease; Subclinical hypothyroidism; and Supervision of high risk pregnancy, antepartum on their problem list. ? ?Patient reports general discomforts of pregnancy.  Contractions: Irritability. Vag. Bleeding: None.  Movement: Present. Denies leaking of fluid.  ? ?The following portions of the patient's history were reviewed and updated as appropriate: allergies, current medications, past family history, past medical history, past social history, past surgical history and problem list. Problem list updated. ? ?Objective:  ? ?Vitals:  ? 02/15/22 0922  ?BP: 106/69  ?Pulse: 76  ?Weight: (!) 303 lb 4.8 oz (137.6 kg)  ? ? ?Fetal Status: Fetal Heart Rate (bpm): 140   Movement: Present    ? ?General:  Alert, oriented and cooperative. Patient is in no acute distress.  ?Skin: Skin is warm and dry. No rash noted.   ?Cardiovascular: Normal heart rate noted  ?Respiratory: Normal respiratory effort, no problems with respiration noted  ?Abdomen: Soft, gravid, appropriate for gestational age. Pain/Pressure: Present     ?Pelvic:  Cervical exam deferred        ?Extremities: Normal range of motion.     ?Mental Status: Normal mood and affect. Normal behavior. Normal judgment and thought content.  ? ?Urinalysis:     ? ?Assessment and Plan:  ?Pregnancy: [id number] at [redacted]w[redacted]d ? ?1. Supervision of high risk pregnancy, antepartum ?Stable ?Declines Tdap vaccine today. May get at next visit ? ?2. Multigravida of advanced maternal age in third trimester ?Stable ?Growth scan next month ? ?3. History of VBAC ?Desires VBAC, consented at last OB visit ? ?Preterm labor symptoms and general obstetric  precautions including but not limited to vaginal bleeding, contractions, leaking of fluid and fetal movement were reviewed in detail with the patient. ?Please refer to After Visit Summary for other counseling recommendations.  ?Return in about 2 weeks (around 03/01/2022) for OB visit, face to face, any provider. ? ? ?Chancy Milroy, MD ?

## 2022-02-19 ENCOUNTER — Ambulatory Visit: Payer: Self-pay | Admitting: Nurse Practitioner

## 2022-02-24 ENCOUNTER — Ambulatory Visit (INDEPENDENT_AMBULATORY_CARE_PROVIDER_SITE_OTHER): Payer: Medicaid Other | Admitting: General Practice

## 2022-02-24 ENCOUNTER — Ambulatory Visit (INDEPENDENT_AMBULATORY_CARE_PROVIDER_SITE_OTHER): Payer: Medicaid Other

## 2022-02-24 VITALS — BP 120/67 | HR 83

## 2022-02-24 DIAGNOSIS — O99210 Obesity complicating pregnancy, unspecified trimester: Secondary | ICD-10-CM | POA: Diagnosis not present

## 2022-02-24 NOTE — Progress Notes (Signed)
Pt informed that the ultrasound is considered a limited OB ultrasound and is not intended to be a complete ultrasound exam.  Patient also informed that the ultrasound is not being completed with the intent of assessing for fetal or placental anomalies or any pelvic abnormalities.  Explained that the purpose of today?s ultrasound is to assess for  BPP, presentation, and AFI.  Patient acknowledges the purpose of the exam and the limitations of the study.    ? ?Mariene Dickerman H RN BSN ?02/24/22 ? ?

## 2022-03-02 ENCOUNTER — Ambulatory Visit: Payer: Medicaid Other | Attending: Obstetrics and Gynecology

## 2022-03-02 ENCOUNTER — Ambulatory Visit: Payer: Medicaid Other | Admitting: *Deleted

## 2022-03-02 VITALS — BP 113/59 | HR 86

## 2022-03-02 DIAGNOSIS — O99210 Obesity complicating pregnancy, unspecified trimester: Secondary | ICD-10-CM | POA: Insufficient documentation

## 2022-03-02 DIAGNOSIS — Z3A35 35 weeks gestation of pregnancy: Secondary | ICD-10-CM | POA: Diagnosis not present

## 2022-03-02 DIAGNOSIS — Z362 Encounter for other antenatal screening follow-up: Secondary | ICD-10-CM | POA: Diagnosis not present

## 2022-03-02 DIAGNOSIS — E669 Obesity, unspecified: Secondary | ICD-10-CM | POA: Diagnosis not present

## 2022-03-02 DIAGNOSIS — O0990 Supervision of high risk pregnancy, unspecified, unspecified trimester: Secondary | ICD-10-CM | POA: Diagnosis not present

## 2022-03-02 DIAGNOSIS — O99213 Obesity complicating pregnancy, third trimester: Secondary | ICD-10-CM | POA: Diagnosis not present

## 2022-03-02 DIAGNOSIS — O09523 Supervision of elderly multigravida, third trimester: Secondary | ICD-10-CM

## 2022-03-02 DIAGNOSIS — O34219 Maternal care for unspecified type scar from previous cesarean delivery: Secondary | ICD-10-CM | POA: Diagnosis not present

## 2022-03-04 ENCOUNTER — Ambulatory Visit (INDEPENDENT_AMBULATORY_CARE_PROVIDER_SITE_OTHER): Payer: Medicaid Other | Admitting: Obstetrics and Gynecology

## 2022-03-04 ENCOUNTER — Encounter: Payer: Self-pay | Admitting: Obstetrics and Gynecology

## 2022-03-04 VITALS — BP 101/67 | HR 74 | Wt 305.9 lb

## 2022-03-04 DIAGNOSIS — Z98891 History of uterine scar from previous surgery: Secondary | ICD-10-CM

## 2022-03-04 DIAGNOSIS — Z23 Encounter for immunization: Secondary | ICD-10-CM

## 2022-03-04 DIAGNOSIS — O09523 Supervision of elderly multigravida, third trimester: Secondary | ICD-10-CM

## 2022-03-04 DIAGNOSIS — O0990 Supervision of high risk pregnancy, unspecified, unspecified trimester: Secondary | ICD-10-CM | POA: Diagnosis not present

## 2022-03-04 DIAGNOSIS — Z789 Other specified health status: Secondary | ICD-10-CM

## 2022-03-04 MED ORDER — CYCLOBENZAPRINE HCL 10 MG PO TABS: 10.0000 mg | ORAL_TABLET | Freq: Three times a day (TID) | ORAL | 2 refills | Status: DC | PRN

## 2022-03-04 NOTE — Progress Notes (Signed)
Pt states is having a lot of back pain & Tylenol does not help. ?

## 2022-03-04 NOTE — Progress Notes (Signed)
Subjective:  ?Constant Delgado is a 41 y.o. G6P5005 at [redacted]w[redacted]d being seen today for ongoing prenatal care.  She is currently monitored for the following issues for this high-risk pregnancy and has History of VBAC; Advanced maternal age in multigravida; Maternal obesity affecting pregnancy, antepartum; Language barrier; BMI 50.0-59.9, adult ([place]); History of ulcer disease; Subclinical hypothyroidism; and Supervision of high risk pregnancy, antepartum on their problem list. ? ?Patient reports backache and general discomforts of pregnancy.  Contractions: Irritability. Vag. Bleeding: None.  Movement: Present. Denies leaking of fluid.  ? ?The following portions of the patient's history were reviewed and updated as appropriate: allergies, current medications, past family history, past medical history, past social history, past surgical history and problem list. Problem list updated. ? ?Objective:  ? ?Vitals:  ? 03/04/22 1041  ?BP: 101/67  ?Pulse: 74  ?Weight: (!) 305 lb 14.4 oz (138.8 kg)  ? ? ?Fetal Status: Fetal Heart Rate (bpm): 153   Movement: Present    ? ?General:  Alert, oriented and cooperative. Patient is in no acute distress.  ?Skin: Skin is warm and dry. No rash noted.   ?Cardiovascular: Normal heart rate noted  ?Respiratory: Normal respiratory effort, no problems with respiration noted  ?Abdomen: Soft, gravid, appropriate for gestational age. Pain/Pressure: Present     ?Pelvic:  Cervical exam deferred        ?Extremities: Normal range of motion.  Edema: Trace  ?Mental Status: Normal mood and affect. Normal behavior. Normal judgment and thought content.  ? ?Urinalysis:     ? ?Assessment and Plan:  ?Pregnancy: [id number] at [redacted]w[redacted]d ? ?1. Supervision of high risk pregnancy, antepartum ?Stable ?GBS and vaginal cultures at next visit ?- Tdap vaccine greater than or equal to 7yo IM ?- cyclobenzaprine (FLEXERIL) 10 MG tablet; Take 1 tablet (10 mg total) by mouth 3 (three) times daily as needed for muscle spasms.  Dispense: 30  tablet; Refill: 2 ? ?2. History of VBAC ?Desires repeat ?Already consented ? ?3. Multigravida of advanced maternal age in third trimester ?Growth scan next week ? ?4. Language barrier ?Stable ? ?Preterm labor symptoms and general obstetric precautions including but not limited to vaginal bleeding, contractions, leaking of fluid and fetal movement were reviewed in detail with the patient. ?Please refer to After Visit Summary for other counseling recommendations.  ?Return in about 1 week (around 03/11/2022) for OB visit, face to face, any provider. ? ? ?Chancy Milroy, MD ?

## 2022-03-04 NOTE — Patient Instructions (Signed)

## 2022-03-08 ENCOUNTER — Ambulatory Visit: Payer: Medicaid Other | Attending: Maternal & Fetal Medicine

## 2022-03-08 ENCOUNTER — Ambulatory Visit: Payer: Medicaid Other | Admitting: *Deleted

## 2022-03-08 VITALS — BP 107/58 | HR 84

## 2022-03-08 DIAGNOSIS — O34219 Maternal care for unspecified type scar from previous cesarean delivery: Secondary | ICD-10-CM | POA: Diagnosis not present

## 2022-03-08 DIAGNOSIS — Z6841 Body Mass Index (BMI) 40.0 and over, adult: Secondary | ICD-10-CM | POA: Insufficient documentation

## 2022-03-08 DIAGNOSIS — O09523 Supervision of elderly multigravida, third trimester: Secondary | ICD-10-CM | POA: Insufficient documentation

## 2022-03-08 DIAGNOSIS — E669 Obesity, unspecified: Secondary | ICD-10-CM | POA: Diagnosis not present

## 2022-03-08 DIAGNOSIS — O99213 Obesity complicating pregnancy, third trimester: Secondary | ICD-10-CM | POA: Insufficient documentation

## 2022-03-08 DIAGNOSIS — O0990 Supervision of high risk pregnancy, unspecified, unspecified trimester: Secondary | ICD-10-CM

## 2022-03-08 DIAGNOSIS — Z3A36 36 weeks gestation of pregnancy: Secondary | ICD-10-CM

## 2022-03-12 ENCOUNTER — Ambulatory Visit (INDEPENDENT_AMBULATORY_CARE_PROVIDER_SITE_OTHER): Payer: Medicaid Other | Admitting: Medical

## 2022-03-12 ENCOUNTER — Other Ambulatory Visit (HOSPITAL_COMMUNITY)
Admission: RE | Admit: 2022-03-12 | Discharge: 2022-03-12 | Disposition: A | Discharge status: Home / Self Care | Payer: Medicaid Other | Source: Ambulatory Visit

## 2022-03-12 VITALS — BP 104/61 | HR 79 | Wt 308.8 lb

## 2022-03-12 DIAGNOSIS — O99210 Obesity complicating pregnancy, unspecified trimester: Secondary | ICD-10-CM

## 2022-03-12 DIAGNOSIS — Z98891 History of uterine scar from previous surgery: Secondary | ICD-10-CM

## 2022-03-12 DIAGNOSIS — O09523 Supervision of elderly multigravida, third trimester: Secondary | ICD-10-CM

## 2022-03-12 DIAGNOSIS — Z0289 Encounter for other administrative examinations: Secondary | ICD-10-CM

## 2022-03-12 DIAGNOSIS — E038 Other specified hypothyroidism: Secondary | ICD-10-CM

## 2022-03-12 DIAGNOSIS — Z3A36 36 weeks gestation of pregnancy: Secondary | ICD-10-CM

## 2022-03-12 DIAGNOSIS — O0990 Supervision of high risk pregnancy, unspecified, unspecified trimester: Secondary | ICD-10-CM | POA: Diagnosis not present

## 2022-03-12 NOTE — Progress Notes (Signed)
   PRENATAL VISIT NOTE  Subjective:  Priscilla Delgado is a 41 y.o. G6P5005 at [redacted]w[redacted]d being seen today for ongoing prenatal care.  She is currently monitored for the following issues for this high-risk pregnancy and has History of VBAC; Advanced maternal age in multigravida; Maternal obesity affecting pregnancy, antepartum; Language barrier; BMI 50.0-59.9, adult ([facility]); History of ulcer disease; Subclinical hypothyroidism; and Supervision of high risk pregnancy, antepartum on their problem list.  Patient reports fatigue.  Contractions: Irritability. Vag. Bleeding: None.  Movement: Present. Denies leaking of fluid.   The following portions of the patient's history were reviewed and updated as appropriate: allergies, current medications, past family history, past medical history, past social history, past surgical history and problem list.   Objective:   Vitals:   03/12/22 0818  BP: 104/61  Pulse: 79  Weight: (!) 308 lb 12.8 oz (140.1 kg)    Fetal Status: Fetal Heart Rate (bpm): 150   Movement: Present  Presentation: Vertex  General:  Alert, oriented and cooperative. Patient is in no acute distress.  Skin: Skin is warm and dry. No rash noted.   Cardiovascular: Normal heart rate noted  Respiratory: Normal respiratory effort, no problems with respiration noted  Abdomen: Soft, gravid, appropriate for gestational age.  Pain/Pressure: Present     Pelvic: Cervical exam performed in the presence of a chaperone Dilation: 3 Effacement (%): 50 Station: -3  Extremities: Normal range of motion.  Edema: Trace  Mental Status: Normal mood and affect. Normal behavior. Normal judgment and thought content.   Assessment and Plan:  Pregnancy: G6P5005 at [redacted]w[redacted]d 1. Supervision of high risk pregnancy, antepartum - GC/Chlamydia probe amp ([place])not at [facility] - Culture, beta strep (group b only) - Still unsure of MOC  2. Multigravida of advanced maternal age in third trimester - < 35 yo  3. History of  VBAC - Signed TOLAC today  4. Maternal obesity affecting pregnancy, antepartum - EFW 75% on 5/15  5. Subclinical hypothyroidism  6. [redacted] weeks gestation of pregnancy  Preterm labor symptoms and general obstetric precautions including but not limited to vaginal bleeding, contractions, leaking of fluid and fetal movement were reviewed in detail with the patient. Please refer to After Visit Summary for other counseling recommendations.   Return in about 1 week (around 03/19/2022) for [facility] APP, In-Person.  Future Appointments  Date Time Provider Department Center  03/18/2022  8:15 AM [facility]  03/25/2022  8:15 AM [facility] NST [facility]    Vonzella Nipple, PA-C

## 2022-03-13 ENCOUNTER — Inpatient Hospital Stay (HOSPITAL_COMMUNITY): Payer: Medicaid Other | Admitting: Anesthesiology

## 2022-03-13 ENCOUNTER — Inpatient Hospital Stay (HOSPITAL_COMMUNITY)
Admission: AD | Admit: 2022-03-13 | Discharge: 2022-03-15 | DRG: 807 | Disposition: A | Discharge status: Home / Self Care | Payer: Medicaid Other | Attending: Obstetrics & Gynecology | Admitting: Obstetrics & Gynecology

## 2022-03-13 ENCOUNTER — Encounter (HOSPITAL_COMMUNITY): Payer: Self-pay | Admitting: Obstetrics and Gynecology

## 2022-03-13 ENCOUNTER — Other Ambulatory Visit: Payer: Self-pay

## 2022-03-13 DIAGNOSIS — O99284 Endocrine, nutritional and metabolic diseases complicating childbirth: Secondary | ICD-10-CM | POA: Diagnosis present

## 2022-03-13 DIAGNOSIS — O42013 Preterm premature rupture of membranes, onset of labor within 24 hours of rupture, third trimester: Secondary | ICD-10-CM | POA: Diagnosis not present

## 2022-03-13 DIAGNOSIS — Z98891 History of uterine scar from previous surgery: Secondary | ICD-10-CM

## 2022-03-13 DIAGNOSIS — Z3A36 36 weeks gestation of pregnancy: Secondary | ICD-10-CM | POA: Diagnosis not present

## 2022-03-13 DIAGNOSIS — Z3A37 37 weeks gestation of pregnancy: Secondary | ICD-10-CM | POA: Diagnosis not present

## 2022-03-13 DIAGNOSIS — Z7982 Long term (current) use of aspirin: Secondary | ICD-10-CM

## 2022-03-13 DIAGNOSIS — O479 False labor, unspecified: Secondary | ICD-10-CM

## 2022-03-13 DIAGNOSIS — O34219 Maternal care for unspecified type scar from previous cesarean delivery: Principal | ICD-10-CM | POA: Diagnosis present

## 2022-03-13 DIAGNOSIS — O34211 Maternal care for low transverse scar from previous cesarean delivery: Secondary | ICD-10-CM | POA: Diagnosis not present

## 2022-03-13 DIAGNOSIS — O99214 Obesity complicating childbirth: Secondary | ICD-10-CM | POA: Diagnosis not present

## 2022-03-13 DIAGNOSIS — E038 Other specified hypothyroidism: Secondary | ICD-10-CM | POA: Diagnosis present

## 2022-03-13 DIAGNOSIS — O4700 False labor before 37 completed weeks of gestation, unspecified trimester: Secondary | ICD-10-CM | POA: Diagnosis present

## 2022-03-13 DIAGNOSIS — O326XX1 Maternal care for compound presentation, fetus 1: Secondary | ICD-10-CM | POA: Diagnosis not present

## 2022-03-13 DIAGNOSIS — O09529 Supervision of elderly multigravida, unspecified trimester: Secondary | ICD-10-CM

## 2022-03-13 DIAGNOSIS — O26893 Other specified pregnancy related conditions, third trimester: Secondary | ICD-10-CM | POA: Diagnosis not present

## 2022-03-13 DIAGNOSIS — O0990 Supervision of high risk pregnancy, unspecified, unspecified trimester: Principal | ICD-10-CM

## 2022-03-13 DIAGNOSIS — E039 Hypothyroidism, unspecified: Secondary | ICD-10-CM | POA: Diagnosis not present

## 2022-03-13 LAB — RPR: RPR Ser Ql: NONREACTIVE

## 2022-03-13 LAB — TYPE AND SCREEN
ABO/RH(D): O POS
Antibody Screen: NEGATIVE

## 2022-03-13 LAB — CBC
HCT: 37.2 % (ref 36.0–46.0)
Hemoglobin: 12.1 g/dL (ref 12.0–15.0)
MCH: 29.4 pg (ref 26.0–34.0)
MCHC: 32.5 g/dL (ref 30.0–36.0)
MCV: 90.3 fL (ref 80.0–100.0)
Platelets: 177 10*3/uL (ref 150–400)
RBC: 4.12 MIL/uL (ref 3.87–5.11)
RDW: 13.6 % (ref 11.5–15.5)
WBC: 7.9 10*3/uL (ref 4.0–10.5)
nRBC: 0 % (ref 0.0–0.2)

## 2022-03-13 MED ORDER — LACTATED RINGERS IV SOLN
INTRAVENOUS | Status: DC
  Administered 2022-03-13 (×2): 1000 mL via INTRAVENOUS

## 2022-03-13 MED ORDER — FENTANYL-BUPIVACAINE-NACL 0.5-0.125-0.9 MG/250ML-% EP SOLN
EPIDURAL | Status: DC | PRN
  Administered 2022-03-13: 12 mL/h via EPIDURAL

## 2022-03-13 MED ORDER — LIDOCAINE HCL (PF) 1 % IJ SOLN: 30.0000 mL | INTRAMUSCULAR | Status: DC | PRN

## 2022-03-13 MED ORDER — LACTATED RINGERS IV SOLN: 500.0000 mL | Freq: Once | INTRAVENOUS | Status: DC

## 2022-03-13 MED ORDER — FENTANYL CITRATE (PF) 100 MCG/2ML IJ SOLN
100.0000 ug | INTRAMUSCULAR | Status: DC | PRN
  Administered 2022-03-13: 100 ug via INTRAVENOUS
  Filled 2022-03-13: qty 2

## 2022-03-13 MED ORDER — LACTATED RINGERS IV SOLN: 500.0000 mL | INTRAVENOUS | Status: DC | PRN

## 2022-03-13 MED ORDER — OXYTOCIN-SODIUM CHLORIDE 30-0.9 UT/500ML-% IV SOLN
2.5000 [IU]/h | INTRAVENOUS | Status: DC
  Filled 2022-03-13: qty 500

## 2022-03-13 MED ORDER — OXYTOCIN BOLUS FROM INFUSION
333.0000 mL | Freq: Once | INTRAVENOUS | Status: AC
  Administered 2022-03-14: 333 mL via INTRAVENOUS

## 2022-03-13 MED ORDER — SODIUM CHLORIDE 0.9 % IV SOLN: 5.0000 10*6.[IU] | Freq: Once | INTRAVENOUS | Status: DC

## 2022-03-13 MED ORDER — SOD CITRATE-CITRIC ACID 500-334 MG/5ML PO SOLN
30.0000 mL | ORAL | Status: DC | PRN
Start: 2022-03-13 — End: 2022-03-14

## 2022-03-13 MED ORDER — PHENYLEPHRINE 80 MCG/ML (10ML) SYRINGE FOR IV PUSH (FOR BLOOD PRESSURE SUPPORT): 80.0000 ug | PREFILLED_SYRINGE | INTRAVENOUS | Status: DC | PRN

## 2022-03-13 MED ORDER — OXYCODONE-ACETAMINOPHEN 5-325 MG PO TABS: 2.0000 | ORAL_TABLET | ORAL | Status: DC | PRN

## 2022-03-13 MED ORDER — EPHEDRINE 5 MG/ML INJ: 10.0000 mg | INTRAVENOUS | Status: DC | PRN

## 2022-03-13 MED ORDER — DIPHENHYDRAMINE HCL 50 MG/ML IJ SOLN: 12.5000 mg | INTRAMUSCULAR | Status: DC | PRN

## 2022-03-13 MED ORDER — LIDOCAINE HCL (PF) 1 % IJ SOLN
INTRAMUSCULAR | Status: DC | PRN
  Administered 2022-03-13: 2 mL via EPIDURAL
  Administered 2022-03-13: 10 mL via EPIDURAL

## 2022-03-13 MED ORDER — ACETAMINOPHEN 325 MG PO TABS
650.0000 mg | ORAL_TABLET | ORAL | Status: DC | PRN
Start: 2022-03-13 — End: 2022-03-14

## 2022-03-13 MED ORDER — PENICILLIN G POT IN DEXTROSE 60000 UNIT/ML IV SOLN: 3.0000 10*6.[IU] | INTRAVENOUS | Status: DC

## 2022-03-13 MED ORDER — ONDANSETRON HCL 4 MG/2ML IJ SOLN: 4.0000 mg | Freq: Four times a day (QID) | INTRAMUSCULAR | Status: DC | PRN

## 2022-03-13 MED ORDER — SODIUM CHLORIDE 0.9 % IV SOLN
2.0000 g | Freq: Once | INTRAVENOUS | Status: AC
  Administered 2022-03-13: 2 g via INTRAVENOUS
  Filled 2022-03-13: qty 2000

## 2022-03-13 MED ORDER — FENTANYL-BUPIVACAINE-NACL 0.5-0.125-0.9 MG/250ML-% EP SOLN
EPIDURAL | Status: AC
  Filled 2022-03-13: qty 250

## 2022-03-13 MED ORDER — OXYCODONE-ACETAMINOPHEN 5-325 MG PO TABS: 1.0000 | ORAL_TABLET | ORAL | Status: DC | PRN

## 2022-03-13 MED ORDER — FENTANYL-BUPIVACAINE-NACL 0.5-0.125-0.9 MG/250ML-% EP SOLN: 12.0000 mL/h | EPIDURAL | Status: DC | PRN

## 2022-03-13 NOTE — H&P (Signed)
OBSTETRIC ADMISSION HISTORY AND PHYSICAL  Priscilla Delgado is a 41 y.o. female [id number] with IUP at [redacted]w[redacted]d by 6 week ultrasound presenting for labor. She reports +FMs, No LOF, no VB, no blurry vision, headaches or peripheral edema, and RUQ pain.  She plans on breast/bottle feeding. She is unsure for birth control. She received her prenatal care at [facility]  Dating: By 6 week ultrasound --->  Estimated Date of Delivery: 04/04/22  Sono:    @[redacted]w[redacted]d , CWD, normal anatomy, variable presentation, posterior placenta, 313g, 82% EFW  @[redacted]w[redacted]d . CWD, normal anatomy, cephalic presentation, posterior placenta, 3087g, 75% EFW   Prenatal History/Complications:  - Previous c-section, VBAC x4 - AMA - Obesity - Subclinical hypothyroidism   Past Medical History: Past Medical History:  Diagnosis Date   Ulcer of the stomach and intestine     Past Surgical History: Past Surgical History:  Procedure Laterality Date   CESAREAN SECTION      Obstetrical History: OB History     Gravida  6   Para  5   Term  5   Preterm  0   AB  0   Living  5      SAB  0   IAB  0   Ectopic  0   Multiple      Live Births  5        Obstetric Comments  C/s for breech- leg was coming down         Social History Social History   Socioeconomic History   Marital status: Single    Spouse name: Not on file   Number of children: Not on file   Years of education: Not on file   Highest education level: Not on file  Occupational History   Not on file  Tobacco Use   Smoking status: Never   Smokeless tobacco: Never  Vaping Use   Vaping Use: Never used  Substance and Sexual Activity   Alcohol use: Yes    Alcohol/week: 1.0 standard drink    Types: 1 Glasses of wine per week    Comment: sometimes on weekends   Drug use: Never   Sexual activity: Yes    Birth control/protection: None  Other Topics Concern   Not on file  Social History Narrative   ** Merged History Encounter **       Social  Determinants of Health   Financial Resource Strain: Not on file  Food Insecurity: No Food Insecurity   Worried About in the Last Year: Never true   Ran Out of Food in the Last Year: Never true  Transportation Needs: No Transportation Needs   Lack of Transportation (Medical): No   Lack of Transportation (Non-Medical): No  Physical Activity: Not on file  Stress: Not on file  Social Connections: Not on file    Family History: Family History  Problem Relation Age of Onset   ADD / ADHD Neg Hx    Diabetes Neg Hx    Hypertension Neg Hx     Allergies: No Known Allergies  Medications Prior to Admission  Medication Sig Dispense Refill Last Dose   aspirin 81 MG chewable tablet Chew 1 tablet (81 mg total) by mouth daily. 30 tablet 7 03/12/2022   Prenatal Vit-Fe Fumarate-FA (PRENATAL COMPLETE) 14-0.4 MG TABS Take 1 tablet by mouth daily. 30 each 2 03/12/2022   acetaminophen (TYLENOL) 500 MG tablet Take 500 mg by mouth every 6 (six) hours as needed.  cyclobenzaprine (FLEXERIL) 10 MG tablet Take 1 tablet (10 mg total) by mouth 3 (three) times daily as needed for muscle spasms. (Patient not taking: Reported on 03/12/2022) 30 tablet 2    Review of Systems   All systems reviewed and negative except as stated in HPI  Blood pressure 122/63, pulse 87, temperature 98.3 F (36.8 C), temperature source Oral, resp. rate 19, height 5\' 4"  (1.626 m), weight (!) 138.8 kg, last menstrual period 07/01/2021, unknown if currently breastfeeding. General appearance: alert and no distress Lungs: effort normal Heart: regular rate Abdomen: soft, non-tender; gravid Pelvic: adequate Extremities: no sign of DVT Presentation: cephalic by RN exam Fetal monitoring: 140 bpm, moderate variability, +15x15 accels, no decels Uterine activity: irregular Dilation: 6 Effacement (%): 70 Station: -2 Exam by:: [email or web address], RN  Prenatal labs: ABO, Rh: --/--/O POS (05/20 04-20-1984) Antibody: NEG  (05/20 04-20-1984) Rubella: 24.00 (01/04 1456) RPR: Non Reactive (04/06 0823)  HBsAg: Negative (01/04 1456)  HIV: Non Reactive (04/06 0823)  GBS:   unknown 2 hr Glucola: 89/81/93 Genetic screening: LR NIPS, AFP negative Anatomy 05-21-1985: normal  Prenatal Transfer Tool  Maternal Diabetes: No Genetic Screening: Normal Maternal Ultrasounds/Referrals: Normal Fetal Ultrasounds or other Referrals:  None Maternal Substance Abuse:  No Significant Maternal Medications:  None Significant Maternal Lab Results: None  Results for orders placed or performed during the hospital encounter of 03/13/22 (from the past 24 hour(s))  Type and screen [facility]   Collection Time: 03/13/22  7:07 AM  Result Value Ref Range   ABO/RH(D) O POS    Antibody Screen NEG    Sample Expiration      03/16/2022,2359 Performed at [facility], [address] [place], [place]   CBC   Collection Time: 03/13/22  7:08 AM  Result Value Ref Range   WBC 7.9 4.0 - 10.5 K/uL   RBC 4.12 3.87 - 5.11 MIL/uL   Hemoglobin 12.1 12.0 - 15.0 g/dL   HCT 03/15/22 20.2 - 54.2 %   MCV 90.3 80.0 - 100.0 fL   MCH 29.4 26.0 - 34.0 pg   MCHC 32.5 30.0 - 36.0 g/dL   RDW 70.6 23.7 - 62.8 %   Platelets 177 150 - 400 K/uL   nRBC 0.0 0.0 - 0.2 %    Patient Active Problem List   Diagnosis Date Noted   Preterm contractions 03/13/2022   Supervision of high risk pregnancy, antepartum 10/06/2021   Subclinical hypothyroidism 11/28/2019   BMI 50.0-59.9, adult ([facility]) 11/26/2019   History of ulcer disease    Language barrier 11/07/2019   Advanced maternal age in multigravida 02/19/2019   Maternal obesity affecting pregnancy, antepartum 02/19/2019   History of VBAC 01/30/2015    Assessment/Plan:  Priscilla Delgado is a 41 y.o. G6P5005 at [redacted]w[redacted]d here for labor.  #Labor: Expectant management given preterm #Pain: Epidural prn #FWB: Cat 1 #ID: GBS unk > PCN #MOF: Both #MOC: Undecided #Circ: Yes   [redacted]w[redacted]d, CNM  03/13/2022, 10:28 AM

## 2022-03-13 NOTE — Progress Notes (Signed)
Priscilla Delgado is a 41 y.o. [id number] at [redacted]w[redacted]d admitted for labor  Subjective: Patient is upset that cervix is unchanged. She has request multiple times to have her water broken.  Objective: BP 112/74   Pulse 76   Temp 97.7 F (36.5 C)   Resp 19   Ht 5\' 4"  (1.626 m)   Wt (!) 138.8 kg   LMP 07/01/2021 (Approximate)   BMI 52.51 kg/m  No intake/output data recorded. No intake/output data recorded.  FHT: 130 bpm, moderate variability, +15x15 accels, no decels UC: occasional SVE:   Dilation: 5.5 Effacement (%): 70 Station: -2 Exam by:: Maryagnes Amos, CNM  Labs: Lab Results  Component Value Date   WBC 7.9 03/13/2022   HGB 12.1 03/13/2022   HCT 37.2 03/13/2022   MCV 90.3 03/13/2022   PLT 177 03/13/2022    Assessment / Plan: Priscilla Delgado is a 41 y.o. [id number] at [redacted]w[redacted]d admitted for labor.  Labor: Cervix unchanged from morning exam. Contractions are sporadic. I discussed with patient discharge home given that I cannot augment labor as she is preterm and I have no medical indication. She is upset and concerned about being discharged home as she is worried that pain will worsen or that she will deliver at home. I reviewed with patient that I have a low suspicion for active labor at this time as she is not having regular contractions, nor is her cervix changing. I reviewed labor precautions with both patient and her husband. They are requesting that she be rechecked around 10pm and if unchanged be discharged home.  Fetal Wellbeing:  Category I Pain Control:   prn I/D:   GBS unk > PCN Anticipated MOD:  NSVD    Renee Harder, CNM 03/13/2022, 7:13 PM

## 2022-03-13 NOTE — Anesthesia Procedure Notes (Signed)
Epidural Patient location during procedure: OB Start time: 03/13/2022 10:49 PM End time: 03/13/2022 11:00 PM  Staffing Anesthesiologist: Lannie Fields, DO Performed: anesthesiologist   Preanesthetic Checklist Completed: patient identified, IV checked, risks and benefits discussed, monitors and equipment checked, pre-op evaluation and timeout performed  Epidural Patient position: sitting Prep: DuraPrep and site prepped and draped Patient monitoring: continuous pulse ox, blood pressure, heart rate and cardiac monitor Approach: midline Location: L3-L4 Injection technique: LOR air  Needle:  Needle type: Tuohy  Needle gauge: 17 G Needle length: 9 cm Needle insertion depth: 8 cm Catheter type: closed end flexible Catheter size: 19 Gauge Catheter at skin depth: 14 cm Test dose: negative  Assessment Sensory level: T8 Events: blood not aspirated, injection not painful, no injection resistance, no paresthesia and negative IV test  Additional Notes Patient identified. Risks/Benefits/Options discussed with patient including but not limited to bleeding, infection, nerve damage, paralysis, failed block, incomplete pain control, headache, blood pressure changes, nausea, vomiting, reactions to medication both or allergic, itching and postpartum back pain. Confirmed with bedside nurse the patient's most recent platelet count. Confirmed with patient that they are not currently taking any anticoagulation, have any bleeding history or any family history of bleeding disorders. Patient expressed understanding and wished to proceed. All questions were answered. Sterile technique was used throughout the entire procedure. Please see nursing notes for vital signs. Test dose was given through epidural catheter and negative prior to continuing to dose epidural or start infusion. Warning signs of high block given to the patient including shortness of breath, tingling/numbness in hands, complete motor  block, or any concerning symptoms with instructions to call for help. Patient was given instructions on fall risk and not to get out of bed. All questions and concerns addressed with instructions to call with any issues or inadequate analgesia.  Reason for block:procedure for pain

## 2022-03-13 NOTE — Anesthesia Preprocedure Evaluation (Signed)
Anesthesia Evaluation  Patient identified by MRN, date of birth, ID band Patient awake    Reviewed: Allergy & Precautions, Patient's Chart, lab work & pertinent test results  Airway Mallampati: III  TM Distance: >3 FB Neck ROM: Full    Dental no notable dental hx.    Pulmonary neg pulmonary ROS,    Pulmonary exam normal breath sounds clear to auscultation       Cardiovascular negative cardio ROS Normal cardiovascular exam Rhythm:Regular Rate:Normal     Neuro/Psych negative neurological ROS  negative psych ROS   GI/Hepatic Neg liver ROS, PUD,   Endo/Other  Hypothyroidism Morbid obesityBMI 53  Renal/GU negative Renal ROS  negative genitourinary   Musculoskeletal negative musculoskeletal ROS (+)   Abdominal (+) + obese,   Peds negative pediatric ROS (+)  Hematology negative hematology ROS (+) hct 37.2, plt 177   Anesthesia Other Findings   Reproductive/Obstetrics (+) Pregnancy Grand multip, one section and then multiple successful VBACs since then                             Anesthesia Physical Anesthesia Plan  ASA: 3  Anesthesia Plan: Epidural   Post-op Pain Management:    Induction:   PONV Risk Score and Plan: 2  Airway Management Planned: Natural Airway  Additional Equipment: None  Intra-op Plan:   Post-operative Plan:   Informed Consent: I have reviewed the patients History and Physical, chart, labs and discussed the procedure including the risks, benefits and alternatives for the proposed anesthesia with the patient or authorized representative who has indicated his/her understanding and acceptance.       Plan Discussed with:   Anesthesia Plan Comments:         Anesthesia Quick Evaluation

## 2022-03-13 NOTE — MAU Note (Signed)
Pt says UC strong since [facility]- clinic  VE  today 4 cm GBS- ?

## 2022-03-13 NOTE — MAU Provider Note (Incomplete)
Priscilla Delgado is a 41 y.o. G34P5005 female at [redacted]w[redacted]d  RN Labor check, not seen by provider SVE by RN: Dilation: 5 Effacement (%): 70 Station: -2 Exam by:: Alondra Twitty, RN NST: FHR baseline *** bpm, Variability: {fhr variability:21617}, Accelerations:{DESC; PRESENT/NOT PRESENT:21021351}, Decelerations:  {fhr decel present:21619}= Cat ***/{fhr accel present:31520} Toco: {obgyn contractions reg/irreg:312982}  D/C home  Myrtis Ser CNM, [facility] 03/13/2022 5:34 AM

## 2022-03-14 ENCOUNTER — Encounter (HOSPITAL_COMMUNITY): Payer: Self-pay | Admitting: Obstetrics and Gynecology

## 2022-03-14 DIAGNOSIS — Z3A37 37 weeks gestation of pregnancy: Secondary | ICD-10-CM | POA: Diagnosis not present

## 2022-03-14 DIAGNOSIS — O42013 Preterm premature rupture of membranes, onset of labor within 24 hours of rupture, third trimester: Secondary | ICD-10-CM | POA: Diagnosis not present

## 2022-03-14 DIAGNOSIS — O34211 Maternal care for low transverse scar from previous cesarean delivery: Secondary | ICD-10-CM | POA: Diagnosis not present

## 2022-03-14 DIAGNOSIS — Z3A00 Weeks of gestation of pregnancy not specified: Secondary | ICD-10-CM | POA: Diagnosis not present

## 2022-03-14 DIAGNOSIS — O326XX1 Maternal care for compound presentation, fetus 1: Secondary | ICD-10-CM | POA: Diagnosis not present

## 2022-03-14 LAB — CBC
HCT: 32 % — ABNORMAL LOW (ref 36.0–46.0)
Hemoglobin: 10.5 g/dL — ABNORMAL LOW (ref 12.0–15.0)
MCH: 29.6 pg (ref 26.0–34.0)
MCHC: 32.8 g/dL (ref 30.0–36.0)
MCV: 90.1 fL (ref 80.0–100.0)
Platelets: 176 10*3/uL (ref 150–400)
RBC: 3.55 MIL/uL — ABNORMAL LOW (ref 3.87–5.11)
RDW: 13.6 % (ref 11.5–15.5)
WBC: 10.9 10*3/uL — ABNORMAL HIGH (ref 4.0–10.5)
nRBC: 0 % (ref 0.0–0.2)

## 2022-03-14 MED ORDER — SIMETHICONE 80 MG PO CHEW: 80.0000 mg | CHEWABLE_TABLET | ORAL | Status: DC | PRN

## 2022-03-14 MED ORDER — PRENATAL MULTIVITAMIN CH
1.0000 | ORAL_TABLET | Freq: Every day | ORAL | Status: DC
  Administered 2022-03-14 – 2022-03-15 (×2): 1 via ORAL
  Filled 2022-03-14 (×2): qty 1

## 2022-03-14 MED ORDER — TRANEXAMIC ACID-NACL 1000-0.7 MG/100ML-% IV SOLN
INTRAVENOUS | Status: AC
  Administered 2022-03-14: 1000 mg
  Filled 2022-03-14: qty 100

## 2022-03-14 MED ORDER — ZOLPIDEM TARTRATE 5 MG PO TABS: 5.0000 mg | ORAL_TABLET | Freq: Every evening | ORAL | Status: DC | PRN

## 2022-03-14 MED ORDER — BENZOCAINE-MENTHOL 20-0.5 % EX AERO: 1.0000 "application " | INHALATION_SPRAY | CUTANEOUS | Status: DC | PRN

## 2022-03-14 MED ORDER — COCONUT OIL OIL: 1.0000 "application " | TOPICAL_OIL | Status: DC | PRN

## 2022-03-14 MED ORDER — SODIUM CHLORIDE 0.9 % IV SOLN: 250.0000 mL | INTRAVENOUS | Status: DC | PRN

## 2022-03-14 MED ORDER — IBUPROFEN 600 MG PO TABS
600.0000 mg | ORAL_TABLET | Freq: Four times a day (QID) | ORAL | Status: DC
  Administered 2022-03-14 – 2022-03-15 (×6): 600 mg via ORAL
  Filled 2022-03-14 (×6): qty 1

## 2022-03-14 MED ORDER — DIBUCAINE (PERIANAL) 1 % EX OINT: 1.0000 "application " | TOPICAL_OINTMENT | CUTANEOUS | Status: DC | PRN

## 2022-03-14 MED ORDER — DIPHENHYDRAMINE HCL 25 MG PO CAPS: 25.0000 mg | ORAL_CAPSULE | Freq: Four times a day (QID) | ORAL | Status: DC | PRN

## 2022-03-14 MED ORDER — TRANEXAMIC ACID-NACL 1000-0.7 MG/100ML-% IV SOLN: 1000.0000 mg | INTRAVENOUS | Status: DC

## 2022-03-14 MED ORDER — SODIUM CHLORIDE 0.9% FLUSH: 3.0000 mL | Freq: Two times a day (BID) | INTRAVENOUS | Status: DC

## 2022-03-14 MED ORDER — TETANUS-DIPHTH-ACELL PERTUSSIS 5-2.5-18.5 LF-MCG/0.5 IM SUSY: 0.5000 mL | PREFILLED_SYRINGE | Freq: Once | INTRAMUSCULAR | Status: DC

## 2022-03-14 MED ORDER — MEASLES, MUMPS & RUBELLA VAC IJ SOLR: 0.5000 mL | Freq: Once | INTRAMUSCULAR | Status: DC

## 2022-03-14 MED ORDER — SENNOSIDES-DOCUSATE SODIUM 8.6-50 MG PO TABS
2.0000 | ORAL_TABLET | ORAL | Status: DC
  Administered 2022-03-14: 2 via ORAL
  Filled 2022-03-14: qty 2

## 2022-03-14 MED ORDER — ONDANSETRON HCL 4 MG PO TABS: 4.0000 mg | ORAL_TABLET | ORAL | Status: DC | PRN

## 2022-03-14 MED ORDER — ACETAMINOPHEN 325 MG PO TABS
650.0000 mg | ORAL_TABLET | ORAL | Status: DC | PRN
  Administered 2022-03-14: 650 mg via ORAL
  Filled 2022-03-14: qty 2

## 2022-03-14 MED ORDER — ONDANSETRON HCL 4 MG/2ML IJ SOLN: 4.0000 mg | INTRAMUSCULAR | Status: DC | PRN

## 2022-03-14 MED ORDER — WITCH HAZEL-GLYCERIN EX PADS: 1.0000 "application " | MEDICATED_PAD | CUTANEOUS | Status: DC | PRN

## 2022-03-14 MED ORDER — SODIUM CHLORIDE 0.9% FLUSH: 3.0000 mL | INTRAVENOUS | Status: DC | PRN

## 2022-03-14 NOTE — Lactation Note (Signed)
This note was copied from a baby's chart. Lactation Consultation Note Mom declined Lactation services.  Patient Name: Priscilla Delgado QBHAL'[id number] Date: 03/14/2022   Age:39 hours  Maternal Data    Feeding    LATCH Score Latch: Repeated attempts needed to sustain latch, nipple held in mouth throughout feeding, stimulation needed to elicit sucking reflex.  Audible Swallowing: Spontaneous and intermittent  Type of Nipple: Flat  Comfort (Breast/Nipple): Soft / non-tender  Hold (Positioning): Assistance needed to correctly position infant at breast and maintain latch.  LATCH Score: 7   Lactation Tools Discussed/Used    Interventions Interventions: Breast feeding basics reviewed;Assisted with latch;Skin to skin  Discharge    Consult Status Consult Status: Complete    Reginal Wojcicki G 03/14/2022, 3:38 AM

## 2022-03-14 NOTE — Anesthesia Postprocedure Evaluation (Signed)
Anesthesia Post Note  Patient: Priscilla Delgado  Procedure(s) Performed: AN AD HOC LABOR EPIDURAL     Patient location during evaluation: Mother Baby Anesthesia Type: Epidural Level of consciousness: awake and alert Pain management: pain level controlled Vital Signs Assessment: post-procedure vital signs reviewed and stable Respiratory status: spontaneous breathing, nonlabored ventilation and respiratory function stable Cardiovascular status: stable Postop Assessment: no headache, no backache, epidural receding, no apparent nausea or vomiting, patient able to bend at knees, adequate PO intake and able to ambulate Anesthetic complications: no   No notable events documented.  Last Vitals:  Vitals:   03/14/22 0321 03/14/22 0738  BP: (!) 93/56 103/66  Pulse: 79 84  Resp: 18 18  Temp: 36.8 C 36.5 C  SpO2:  100%    Last Pain:  Vitals:   03/14/22 0738  TempSrc: Oral  PainSc: 0-No pain   Pain Goal:                   [employer]

## 2022-03-14 NOTE — Discharge Summary (Signed)
Postpartum Discharge Summary     Patient Name: Priscilla Delgado DOB: 1981-10-05 MRN: [id number]  Date of admission: 03/13/2022 Delivery date:03/14/2022  Delivering provider: Patriciaann Clan  Date of discharge: 03/15/2022  Admitting diagnosis: Preterm contractions [O47.00] Intrauterine pregnancy: [redacted]w[redacted]d    Secondary diagnosis:  Principal Problem:   Preterm contractions Active Problems:   History of VBAC   Advanced maternal age in multigravida  Additional problems: None    Discharge diagnosis: Term Pregnancy Delivered                                              Post partum procedures: None Augmentation: N/A Complications: None  Hospital course: Onset of Labor With Vaginal Delivery      41y.o. yo [id number] 321w0dat 321w0das admitted in Latent Labor on 03/13/2022. Patient had an uncomplicated labor course as follows:  Membrane Rupture Time/Date: 11:59 PM ,03/13/2022   Delivery Method:VBAC, Spontaneous  Episiotomy: None  Lacerations:  None  Patient had an uncomplicated postpartum course.  She is ambulating, tolerating a regular diet, passing flatus, and urinating well. Patient is discharged home in stable condition on 03/15/22.  Newborn Data: Birth date:03/14/2022  Birth time:12:09 AM  Gender:Female  Living status:Living  Apgars:8 ,9  Weight:3260 g   Magnesium Sulfate received: No BMZ received: No Rhophylac:No MMR:No T-DaP:Given prenatally Flu: No Transfusion:No  Physical exam  Vitals:   03/14/22 1111 03/14/22 1500 03/14/22 2100 03/15/22 0602  BP: 93/60 105/61 (!) 104/51 (!) 105/46  Pulse: 77 67 73 72  Resp: _0 Temp:  97.9 F (36.6 C) 98 F (36.7 C) 97.9 F (36.6 C)  TempSrc:  Oral Oral Oral  SpO2: 100% 97% 100%   Weight:      Height:       General: alert, cooperative, and no distress Lochia: appropriate Uterine Fundus: firm Incision: N/A DVT Evaluation: No evidence of DVT seen on physical exam. No cords or calf tenderness. No significant calf/ankle  edema. Labs: Lab Results  Component Value Date   WBC 10.9 (H) 03/14/2022   HGB 10.5 (L) 03/14/2022   HCT 32.0 (L) 03/14/2022   MCV 90.1 03/14/2022   PLT 176 03/14/2022      Latest Ref Rng & Units 08/08/2021   10:26 PM  CMP  Glucose 70 - 99 mg/dL 94    BUN 6 - 20 mg/dL 11    Creatinine 0.44 - 1.00 mg/dL 0.83    Sodium 135 - 145 mmol/L 136    Potassium 3.5 - 5.1 mmol/L 4.0    Chloride 98 - 111 mmol/L 105    CO2 22 - 32 mmol/L 23    Calcium 8.9 - 10.3 mg/dL 9.1    Total Protein 6.5 - 8.1 g/dL 6.9    Total Bilirubin 0.3 - 1.2 mg/dL 0.7    Alkaline Phos 38 - 126 U/L 62    AST 15 - 41 U/L 16    ALT 0 - 44 U/L 14     Edinburgh Score:    03/14/2022    1:28 PM  Edinburgh Postnatal Depression Scale Screening Tool  I have been able to laugh and see the funny side of things. 3  I have looked forward with enjoyment to things. 0  I have blamed myself unnecessarily when things went wrong. 0  I have been anxious or worried  for no good reason. 0  I have felt scared or panicky for no good reason. 0  Things have been getting on top of me. 0  I have been so unhappy that I have had difficulty sleeping. 0  I have felt sad or miserable. 0  I have been so unhappy that I have been crying. 0  The thought of harming myself has occurred to me. 0  Edinburgh Postnatal Depression Scale Total 3     After visit meds:  Allergies as of 03/15/2022   No Known Allergies      Medication List     STOP taking these medications    aspirin 81 MG chewable tablet   cyclobenzaprine 10 MG tablet Commonly known as: FLEXERIL       TAKE these medications    acetaminophen 500 MG tablet Commonly known as: TYLENOL Take 500 mg by mouth every 6 (six) hours as needed.   coconut oil Oil Apply 1 application. topically as needed.   ibuprofen 600 MG tablet Commonly known as: ADVIL Take 1 tablet (600 mg total) by mouth every 6 (six) hours.   Prenatal Complete 14-0.4 MG Tabs Take 1 tablet by mouth  daily.   senna-docusate 8.6-50 MG tablet Commonly known as: Senokot-S Take 2 tablets by mouth daily.         Discharge home in stable condition Infant Feeding: Breast Infant Disposition:home with mother Discharge instruction: per After Visit Summary and Postpartum booklet. Activity: Advance as tolerated. Pelvic rest for 6 weeks.  Diet: routine diet Future Appointments: No future appointments.  Follow up Visit:  [facility].   Specialty: Obstetrics and Gynecology Contact information: [address] [place] [phone]                Message sent to [facility] by Dr Higinio Plan:  Please schedule this patient for a In person postpartum visit in 6 weeks with the following provider: Any provider. Additional Postpartum F/U: None   Low risk pregnancy complicated by:  multi-parity Delivery mode:  VBAC, Spontaneous  Anticipated Birth Control:  IUD outpatient    03/15/2022 Lenoria Chime, MD

## 2022-03-15 LAB — GC/CHLAMYDIA PROBE AMP (~~LOC~~) NOT AT ARMC
Chlamydia: NEGATIVE
Comment: NEGATIVE
Comment: NORMAL
Neisseria Gonorrhea: NEGATIVE

## 2022-03-15 MED ORDER — IBUPROFEN 600 MG PO TABS: 600.0000 mg | ORAL_TABLET | Freq: Four times a day (QID) | ORAL | 0 refills | Status: DC

## 2022-03-15 MED ORDER — COCONUT OIL OIL
1.0000 | TOPICAL_OIL | 0 refills | Status: DC | PRN
Start: 2022-03-15 — End: 2022-03-20

## 2022-03-15 MED ORDER — SENNOSIDES-DOCUSATE SODIUM 8.6-50 MG PO TABS
2.0000 | ORAL_TABLET | ORAL | Status: DC
Start: 2022-03-15 — End: 2022-03-22

## 2022-03-16 LAB — CULTURE, BETA STREP (GROUP B ONLY): Strep Gp B Culture: NEGATIVE

## 2022-03-16 LAB — SURGICAL PATHOLOGY

## 2022-03-18 ENCOUNTER — Other Ambulatory Visit: Payer: No Typology Code available for payment source

## 2022-03-19 ENCOUNTER — Encounter: Payer: No Typology Code available for payment source | Admitting: Obstetrics & Gynecology

## 2022-03-20 ENCOUNTER — Emergency Department (HOSPITAL_COMMUNITY): Payer: Medicaid Other

## 2022-03-20 ENCOUNTER — Inpatient Hospital Stay (HOSPITAL_COMMUNITY)
Admission: EM | Admit: 2022-03-20 | Discharge: 2022-03-22 | DRG: 776 | Disposition: A | Discharge status: Home / Self Care | Payer: Medicaid Other | Attending: Family Medicine | Admitting: Family Medicine

## 2022-03-20 ENCOUNTER — Other Ambulatory Visit: Payer: Self-pay

## 2022-03-20 ENCOUNTER — Encounter (HOSPITAL_COMMUNITY): Payer: Self-pay | Admitting: Emergency Medicine

## 2022-03-20 DIAGNOSIS — O165 Unspecified maternal hypertension, complicating the puerperium: Secondary | ICD-10-CM

## 2022-03-20 DIAGNOSIS — I509 Heart failure, unspecified: Secondary | ICD-10-CM | POA: Diagnosis not present

## 2022-03-20 DIAGNOSIS — R0602 Shortness of breath: Secondary | ICD-10-CM | POA: Diagnosis not present

## 2022-03-20 DIAGNOSIS — O903 Peripartum cardiomyopathy: Principal | ICD-10-CM | POA: Diagnosis present

## 2022-03-20 DIAGNOSIS — O99215 Obesity complicating the puerperium: Secondary | ICD-10-CM | POA: Diagnosis not present

## 2022-03-20 DIAGNOSIS — I10 Essential (primary) hypertension: Secondary | ICD-10-CM | POA: Diagnosis not present

## 2022-03-20 DIAGNOSIS — O99893 Other specified diseases and conditions complicating puerperium: Secondary | ICD-10-CM | POA: Diagnosis present

## 2022-03-20 DIAGNOSIS — I517 Cardiomegaly: Secondary | ICD-10-CM

## 2022-03-20 DIAGNOSIS — Z6841 Body Mass Index (BMI) 40.0 and over, adult: Secondary | ICD-10-CM

## 2022-03-20 DIAGNOSIS — R079 Chest pain, unspecified: Secondary | ICD-10-CM | POA: Diagnosis not present

## 2022-03-20 DIAGNOSIS — I5021 Acute systolic (congestive) heart failure: Secondary | ICD-10-CM

## 2022-03-20 DIAGNOSIS — I110 Hypertensive heart disease with heart failure: Secondary | ICD-10-CM | POA: Diagnosis not present

## 2022-03-20 DIAGNOSIS — J90 Pleural effusion, not elsewhere classified: Secondary | ICD-10-CM | POA: Diagnosis not present

## 2022-03-20 LAB — BASIC METABOLIC PANEL
Anion gap: 8 (ref 5–15)
BUN: 13 mg/dL (ref 6–20)
CO2: 20 mmol/L — ABNORMAL LOW (ref 22–32)
Calcium: 8.6 mg/dL — ABNORMAL LOW (ref 8.9–10.3)
Chloride: 113 mmol/L — ABNORMAL HIGH (ref 98–111)
Creatinine, Ser: 0.9 mg/dL (ref 0.44–1.00)
GFR, Estimated: >60 mL/min (ref >60)
Glucose, Bld: 83 mg/dL (ref 70–99)
Potassium: 4.4 mmol/L (ref 3.5–5.1)
Sodium: 141 mmol/L (ref 135–145)

## 2022-03-20 LAB — PROTEIN / CREATININE RATIO, URINE
Creatinine, Urine: 10.34 mg/dL
Total Protein, Urine: <6 mg/dL

## 2022-03-20 LAB — HEPATIC FUNCTION PANEL
ALT: 43 U/L (ref 0–44)
AST: 38 U/L (ref 15–41)
Albumin: 2.7 g/dL — ABNORMAL LOW (ref 3.5–5.0)
Alkaline Phosphatase: 97 U/L (ref 38–126)
Bilirubin, Direct: 0.1 mg/dL (ref 0.0–0.2)
Indirect Bilirubin: 0.4 mg/dL (ref 0.3–0.9)
Total Bilirubin: 0.5 mg/dL (ref 0.3–1.2)
Total Protein: 5.8 g/dL — ABNORMAL LOW (ref 6.5–8.1)

## 2022-03-20 LAB — ECHOCARDIOGRAM COMPLETE
AR max vel: 2.41 cm2
AV Area VTI: 2.05 cm2
AV Area mean vel: 2.18 cm2
AV Mean grad: 6 mmHg
AV Peak grad: 11.3 mmHg
Ao pk vel: 1.68 m/s
Area-P 1/2: 2.93 cm2
Height: 64 in
S' Lateral: 3.2 cm

## 2022-03-20 LAB — CBC WITH DIFFERENTIAL/PLATELET
Abs Immature Granulocytes: 0.02 10*3/uL (ref 0.00–0.07)
Basophils Absolute: 0 10*3/uL (ref 0.0–0.1)
Basophils Relative: 1 %
Eosinophils Absolute: 0.1 10*3/uL (ref 0.0–0.5)
Eosinophils Relative: 2 %
HCT: 36.4 % (ref 36.0–46.0)
Hemoglobin: 11.9 g/dL — ABNORMAL LOW (ref 12.0–15.0)
Immature Granulocytes: 0 %
Lymphocytes Relative: 24 %
Lymphs Abs: 1.2 10*3/uL (ref 0.7–4.0)
MCH: 29.9 pg (ref 26.0–34.0)
MCHC: 32.7 g/dL (ref 30.0–36.0)
MCV: 91.5 fL (ref 80.0–100.0)
Monocytes Absolute: 0.3 10*3/uL (ref 0.1–1.0)
Monocytes Relative: 7 %
Neutro Abs: 3.2 10*3/uL (ref 1.7–7.7)
Neutrophils Relative %: 66 %
Platelets: 218 10*3/uL (ref 150–400)
RBC: 3.98 MIL/uL (ref 3.87–5.11)
RDW: 13.4 % (ref 11.5–15.5)
WBC: 4.9 10*3/uL (ref 4.0–10.5)
nRBC: 0 % (ref 0.0–0.2)

## 2022-03-20 LAB — URINALYSIS, ROUTINE W REFLEX MICROSCOPIC
Bacteria, UA: NONE SEEN
Bilirubin Urine: NEGATIVE
Glucose, UA: NEGATIVE mg/dL
Ketones, ur: NEGATIVE mg/dL
Leukocytes,Ua: NEGATIVE
Nitrite: NEGATIVE
Protein, ur: NEGATIVE mg/dL
Specific Gravity, Urine: 1.015 (ref 1.005–1.030)
pH: 7 (ref 5.0–8.0)

## 2022-03-20 LAB — TROPONIN I (HIGH SENSITIVITY)
Troponin I (High Sensitivity): 7 ng/L (ref <18)
Troponin I (High Sensitivity): 10 ng/L (ref <18)

## 2022-03-20 LAB — BRAIN NATRIURETIC PEPTIDE: B Natriuretic Peptide: 77.7 pg/mL (ref 0.0–100.0)

## 2022-03-20 LAB — I-STAT CHEM 8, ED
BUN: 13 mg/dL (ref 6–20)
Calcium, Ion: 1.13 mmol/L — ABNORMAL LOW (ref 1.15–1.40)
Chloride: 111 mmol/L (ref 98–111)
Creatinine, Ser: 0.8 mg/dL (ref 0.44–1.00)
Glucose, Bld: 80 mg/dL (ref 70–99)
HCT: 35 % — ABNORMAL LOW (ref 36.0–46.0)
Hemoglobin: 11.9 g/dL — ABNORMAL LOW (ref 12.0–15.0)
Potassium: 4.3 mmol/L (ref 3.5–5.1)
Sodium: 141 mmol/L (ref 135–145)
TCO2: 20 mmol/L — ABNORMAL LOW (ref 22–32)

## 2022-03-20 MED ORDER — SODIUM CHLORIDE 0.9 % IV SOLN: 250.0000 mL | INTRAVENOUS | Status: DC | PRN

## 2022-03-20 MED ORDER — SODIUM CHLORIDE 0.9% FLUSH
3.0000 mL | INTRAVENOUS | Status: DC | PRN
Start: 2022-03-20 — End: 2022-03-22

## 2022-03-20 MED ORDER — NITROGLYCERIN IN D5W 200-5 MCG/ML-% IV SOLN
0.0000 ug/min | INTRAVENOUS | Status: DC
  Administered 2022-03-20: 5 ug/min via INTRAVENOUS
  Filled 2022-03-20: qty 250

## 2022-03-20 MED ORDER — ASPIRIN 81 MG PO TBEC
81.0000 mg | DELAYED_RELEASE_TABLET | Freq: Every day | ORAL | Status: DC
  Administered 2022-03-20 – 2022-03-21 (×2): 81 mg via ORAL
  Filled 2022-03-20 (×2): qty 1

## 2022-03-20 MED ORDER — SODIUM CHLORIDE 0.9% FLUSH
3.0000 mL | Freq: Two times a day (BID) | INTRAVENOUS | Status: DC
  Administered 2022-03-20 – 2022-03-21 (×2): 3 mL via INTRAVENOUS

## 2022-03-20 MED ORDER — ONDANSETRON HCL 4 MG/2ML IJ SOLN: 4.0000 mg | Freq: Four times a day (QID) | INTRAMUSCULAR | Status: DC | PRN

## 2022-03-20 MED ORDER — ENOXAPARIN SODIUM 80 MG/0.8ML IJ SOSY
70.0000 mg | PREFILLED_SYRINGE | INTRAMUSCULAR | Status: DC
  Administered 2022-03-20 – 2022-03-21 (×2): 70 mg via SUBCUTANEOUS
  Filled 2022-03-20 (×2): qty 0.8

## 2022-03-20 MED ORDER — ACETAMINOPHEN 325 MG PO TABS
650.0000 mg | ORAL_TABLET | ORAL | Status: DC | PRN
  Administered 2022-03-20 – 2022-03-22 (×3): 650 mg via ORAL
  Filled 2022-03-20 (×3): qty 2

## 2022-03-20 MED ORDER — IOHEXOL 350 MG/ML SOLN
100.0000 mL | Freq: Once | INTRAVENOUS | Status: AC | PRN
  Administered 2022-03-20: 100 mL via INTRAVENOUS

## 2022-03-20 MED ORDER — FUROSEMIDE 10 MG/ML IJ SOLN
40.0000 mg | Freq: Once | INTRAMUSCULAR | Status: AC
  Administered 2022-03-20: 40 mg via INTRAVENOUS
  Filled 2022-03-20: qty 4

## 2022-03-20 MED ORDER — HYDRALAZINE HCL 50 MG PO TABS
25.0000 mg | ORAL_TABLET | Freq: Three times a day (TID) | ORAL | Status: DC
  Administered 2022-03-20 – 2022-03-22 (×6): 25 mg via ORAL
  Filled 2022-03-20 (×6): qty 1

## 2022-03-20 MED ORDER — PRENATAL MULTIVITAMIN CH
1.0000 | ORAL_TABLET | Freq: Every day | ORAL | Status: DC
  Administered 2022-03-20 – 2022-03-21 (×2): 1 via ORAL
  Filled 2022-03-20 (×2): qty 1

## 2022-03-20 NOTE — ED Triage Notes (Signed)
Pt endorse SOB and bladder pain for 3 days. Pt had a baby 5 days ago. Points to pain in upper abd/lower chest.

## 2022-03-20 NOTE — Consult Note (Signed)
Cardiology Consultation:   Patient ID: Priscilla Delgado MRN: [id number]; DOB: 1980-12-24  Admit date: 03/20/2022 Date of Consult: 03/20/2022  PCP:  Priscilla Delgado, [place] (Inactive)   [facility] Providers Cardiologist:  None   {   Patient Profile:   Priscilla Delgado is a 41 y.o. [id number] female who recently delivered on 03/14/22 at [redacted]w[redacted]d  is being seen 03/20/2022 for the evaluation of acute systolic heart failure at the request of Dr. Kennon Rounds.  History of Present Illness:   [name] is a 41 year old female with no significant past medical history who presented to the ER with progressive SOB and chest pain radiating to her back. The patient states that she had an uncomplicated vaginal delivery of her sixth child about 6 days ago and was discharged on 03/15/22. Her pregnancy was uncomplicated. Denies any history of pre-eclampsia, HTN, diabetes or history of heart failure/cardiovascular disease. She was doing well until about 3 days ago when she became progressively short of breath that worsened with activity and laying flat. Also with substernal chest pain radiating to her back. Given the progression of her symptoms, the patient presented to [facility] for further management.   Here, patient hypertensive to 170s on arrival. HR 40s in sinus bradycardia. Spo2 97% on RA. Labs notable for HgB 11.9 but otherwise normal renal funciton and electrolytes. Cr 0.8. Trop 7, BNP 77. ECG with sinus bradycardia. CXR with pulmonary edema. TTE with LVEF 45-50 with hypokinesis of the mid-to-basal inferior wall. RV normal. Mild LAE. Trivial pericardial effusion. No other significant valve disease.  She was given lasix 40mg  IV and started on hydralazine for afterload reduction.   Past Medical History:  Diagnosis Date   Ulcer of the stomach and intestine     Past Surgical History:  Procedure Laterality Date   CESAREAN SECTION       Home Medications:  Prior to Admission medications   Medication Sig Start Date End  Date Taking? Authorizing Provider  acetaminophen (TYLENOL) 500 MG tablet Take 500 mg by mouth every 6 (six) hours as needed for mild pain.   Yes [provider]  ibuprofen (ADVIL) 600 MG tablet Take 1 tablet (600 mg total) by mouth every 6 (six) hours. Patient taking differently: Take 600 mg by mouth every 6 (six) hours as needed for mild pain. 03/15/22  Yes Pray, Norwood Levo, MD  Prenatal Vit-Fe Fumarate-FA (PRENATAL COMPLETE) 14-0.4 MG TABS Take 1 tablet by mouth daily. 02/05/19  Yes Marcille Buffy D, CNM  senna-docusate (SENOKOT-S) 8.6-50 MG tablet Take 2 tablets by mouth daily. Patient taking differently: Take 1 tablet by mouth at bedtime as needed for mild constipation. 03/15/22  Yes Pray, Norwood Levo, MD  cyclobenzaprine (FLEXERIL) 10 MG tablet Take 10 mg by mouth 3 (three) times daily as needed for muscle spasms.    [provider]    Inpatient Medications: Scheduled Meds:  hydrALAZINE  25 mg Oral Q8H   Continuous Infusions:  PRN Meds:   Allergies:   No Known Allergies  Social History:   Social History   Socioeconomic History   Marital status: Single    Spouse name: Not on file   Number of children: Not on file   Years of education: Not on file   Highest education level: Not on file  Occupational History   Not on file  Tobacco Use   Smoking status: Never   Smokeless tobacco: Never  Vaping Use   Vaping Use: Never used  Substance and Sexual Activity  Alcohol use: Yes    Alcohol/week: 1.0 standard drink    Types: 1 Glasses of wine per week    Comment: sometimes on weekends   Drug use: Never   Sexual activity: Yes    Birth control/protection: None  Other Topics Concern   Not on file  Social History Narrative   ** Merged History Encounter **       Social Determinants of Health   Financial Resource Strain: Not on file  Food Insecurity: No Food Insecurity   Worried About Charity fundraiser in the Last Year: Never true   Ran Out of Food in the  Last Year: Never true  Transportation Needs: No Transportation Needs   Lack of Transportation (Medical): No   Lack of Transportation (Non-Medical): No  Physical Activity: Not on file  Stress: Not on file  Social Connections: Not on file  Intimate Partner Violence: Not on file    Family History:    Family History  Problem Relation Age of Onset   ADD / ADHD Neg Hx    Diabetes Neg Hx    Hypertension Neg Hx      ROS:  Please see the history of present illness.  All other ROS reviewed and negative.     Physical Exam/Data:   Vitals:   03/20/22 1045 03/20/22 1100 03/20/22 1225 03/20/22 1340  BP: (!) 154/76 (!) 170/85 (!) 180/82 (!) 153/75  Pulse: (!) 43 (!) 44 (!) 45 (!) 46  Resp: 17 19 18 13   Temp:      TempSrc:      SpO2: 97% 97% 98% 98%  Height:       No intake or output data in the 24 hours ending 03/20/22 1353    03/13/2022    3:37 AM 03/12/2022    8:18 AM 03/04/2022   10:41 AM  Last 3 Weights  Weight (lbs) 305 lb 14.4 oz 308 lb 12.8 oz 305 lb 14.4 oz  Weight (kg) 138.755 kg 140.071 kg 138.755 kg     Body mass index is 52.51 kg/m.  General:  Comfortable, sitting up in bed HEENT: normal Neck: no JVD Vascular: No carotid bruits; Distal pulses 2+ bilaterally Cardiac:  Bradycardic, regular, no murmurs Lungs:  bilateral crackles  Abd: obese, soft Ext: Trace edema, warm Musculoskeletal:  No deformities, BUE and BLE strength normal and equal Skin: warm and dry  Neuro:  CNs 2-12 intact, no focal abnormalities noted Psych:  Normal affect   EKG:  The EKG was personally reviewed and demonstrates:  sinus bradycardia Telemetry:  Telemetry was personally reviewed and demonstrates:  sinus bradycardia, one run NSVT  Relevant CV Studies: TTE 03/20/22: IMPRESSIONS     1. Mid / basal inferior wall hypokinesis . Left ventricular ejection  fraction, by estimation, is 45 to 50%. The left ventricle has mildly  decreased function. The left ventricle has no regional wall motion   abnormalities. The left ventricular internal  cavity size was mildly dilated. Left ventricular diastolic parameters were  normal.   2. Right ventricular systolic function is normal. The right ventricular  size is normal.   3. Left atrial size was mildly dilated.   4. The pericardial effusion is posterior to the left ventricle.   5. The mitral valve is normal in structure. No evidence of mitral valve  regurgitation. No evidence of mitral stenosis.   6. The aortic valve is tricuspid. Aortic valve regurgitation is not  visualized. No aortic stenosis is present.   7. The  inferior vena cava is normal in size with greater than 50%  respiratory variability, suggesting right atrial pressure of 3 mmHg.  Laboratory Data:  High Sensitivity Troponin:   Recent Labs  Lab 03/20/22 1041  TROPONINIHS 7     Chemistry Recent Labs  Lab 03/20/22 1041 03/20/22 1050  NA 141 141  K 4.4 4.3  CL 113* 111  CO2 20*  --   GLUCOSE 83 80  BUN 13 13  CREATININE 0.90 0.80  CALCIUM 8.6*  --   GFRNONAA >60  --   ANIONGAP 8  --     No results for input(s): PROT, ALBUMIN, AST, ALT, ALKPHOS, BILITOT in the last 168 hours. Lipids No results for input(s): CHOL, TRIG, HDL, LABVLDL, LDLCALC, CHOLHDL in the last 168 hours.  Hematology Recent Labs  Lab 03/14/22 0505 03/20/22 1041 03/20/22 1050  WBC 10.9* 4.9  --   RBC 3.55* 3.98  --   HGB 10.5* 11.9* 11.9*  HCT 32.0* 36.4 35.0*  MCV 90.1 91.5  --   MCH 29.6 29.9  --   MCHC 32.8 32.7  --   RDW 13.6 13.4  --   PLT 176 218  --    Thyroid No results for input(s): TSH, FREET4 in the last 168 hours.  BNP Recent Labs  Lab 03/20/22 1041  BNP 77.7    DDimer No results for input(s): DDIMER in the last 168 hours.   Radiology/Studies:  CT Angio Chest PE W/Cm &/Or Wo Cm  Result Date: 03/20/2022 CLINICAL DATA:  High probability for acute pulmonary embolism. EXAM: CT ANGIOGRAPHY CHEST WITH CONTRAST TECHNIQUE: Multidetector CT imaging of the chest was  performed using the standard protocol during bolus administration of intravenous contrast. Multiplanar CT image reconstructions and MIPs were obtained to evaluate the vascular anatomy. RADIATION DOSE REDUCTION: This exam was performed according to the departmental dose-optimization program which includes automated exposure control, adjustment of the mA and/or kV according to patient size and/or use of iterative reconstruction technique. CONTRAST:  125mL OMNIPAQUE IOHEXOL 350 MG/ML SOLN COMPARISON:  None Available. FINDINGS: Cardiovascular: Diminished exam detail due to respiratory motion artifact and sub optimal pulmonary arterial opacification. Within this limitation there is no sign of lobar or segmental pulmonary artery filling defects. Mild cardiac enlargement without pericardial effusion. Mediastinum/Nodes: No enlarged mediastinal, hilar, or axillary lymph nodes. Thyroid gland, trachea, and esophagus demonstrate no significant findings. Lungs/Pleura: There are small bilateral pleural effusions. Multifocal bilateral areas of ground-glass attenuation are seen within the left upper lobe and both lower lobes. No airspace consolidation or atelectasis. No signs of pneumothorax. Upper Abdomen: No acute abnormality. Reflux of contrast material is identified into the IVC and hepatic veins. Musculoskeletal: No chest wall abnormality. No acute or significant osseous findings. Review of the MIP images confirms the above findings. IMPRESSION: 1. Diminished exam detail due to respiratory motion artifact and sub optimal pulmonary arterial opacification. Within this limitation there is no evidence for lobar or segmental pulmonary artery filling defects to suggest a clinically significant pulmonary embolus. 2. Small bilateral pleural effusions. 3. Multifocal bilateral areas of ground-glass attenuation are noted within the left upper lobe and both lower lobes. Findings may reflect pulmonary edema or infection. 4. Reflux of  contrast material into the IVC and hepatic veins. Although nonspecific this may be seen in the setting of right heart failure. Electronically Signed   By: Kerby Moors M.D.   On: 03/20/2022 12:27   DG Chest Port 1 View  Result Date: 03/20/2022 CLINICAL DATA:  Short of breath.  Postpartum. EXAM: PORTABLE CHEST 1 VIEW COMPARISON:  12/25/2020. FINDINGS: Cardiac silhouette normal in size.  No mediastinal or hilar masses. Hazy perihilar and lower lung zone opacities, new since the prior exam, accentuated by positioning and low lung volumes. No convincing pleural effusion. No pneumothorax. Skeletal structures are grossly intact. IMPRESSION: 1. New hazy perihilar and lower lung zone airspace opacities suspected to be due to pulmonary edema. Electronically Signed   By: Lajean Manes M.D.   On: 03/20/2022 11:12   ECHOCARDIOGRAM COMPLETE  Result Date: 03/20/2022    ECHOCARDIOGRAM REPORT   Patient Name:   Jamelia Dentler Date of Exam: 03/20/2022 Medical Rec #:  [id number]      Height:       64.0 in Accession #:    [id number]     Weight:       305.9 lb Date of Birth:  December 10, 1980       BSA:          2.344 m Patient Age:    69 years       BP:           170/85 mmHg Patient Gender: F              HR:           41 bpm. Exam Location:  Inpatient Procedure: 2D Echo, Cardiac Doppler and Color Doppler                       STAT ECHO Reported to: Dr Jenkins Rouge on 03/20/2022 11:56:00 AM. Indications:    Cardiomegaly  History:        Patient has no prior history of Echocardiogram examinations.  Sonographer:    Clayton Lefort RDCS (AE) Referring Phys: [id number] LAURA A MURPHY  Sonographer Comments: Suboptimal subcostal window. Five days postpartum. IMPRESSIONS  1. Mid / basal inferior wall hypokinesis . Left ventricular ejection fraction, by estimation, is 45 to 50%. The left ventricle has mildly decreased function. The left ventricle has no regional wall motion abnormalities. The left ventricular internal cavity size was mildly dilated.  Left ventricular diastolic parameters were normal.  2. Right ventricular systolic function is normal. The right ventricular size is normal.  3. Left atrial size was mildly dilated.  4. The pericardial effusion is posterior to the left ventricle.  5. The mitral valve is normal in structure. No evidence of mitral valve regurgitation. No evidence of mitral stenosis.  6. The aortic valve is tricuspid. Aortic valve regurgitation is not visualized. No aortic stenosis is present.  7. The inferior vena cava is normal in size with greater than 50% respiratory variability, suggesting right atrial pressure of 3 mmHg. FINDINGS  Left Ventricle: Mid / basal inferior wall hypokinesis. Left ventricular ejection fraction, by estimation, is 45 to 50%. The left ventricle has mildly decreased function. The left ventricle has no regional wall motion abnormalities. The left ventricular internal cavity size was mildly dilated. There is no left ventricular hypertrophy. Left ventricular diastolic parameters were normal. Right Ventricle: The right ventricular size is normal. No increase in right ventricular wall thickness. Right ventricular systolic function is normal. Left Atrium: Left atrial size was mildly dilated. Right Atrium: Right atrial size was normal in size. Pericardium: Trivial pericardial effusion is present. The pericardial effusion is posterior to the left ventricle. Mitral Valve: The mitral valve is normal in structure. No evidence of mitral valve regurgitation. No evidence of mitral valve stenosis. Tricuspid Valve: The tricuspid  valve is normal in structure. Tricuspid valve regurgitation is mild . No evidence of tricuspid stenosis. Aortic Valve: The aortic valve is tricuspid. Aortic valve regurgitation is not visualized. No aortic stenosis is present. Aortic valve mean gradient measures 6.0 mmHg. Aortic valve peak gradient measures 11.3 mmHg. Aortic valve area, by VTI measures 2.05  cm. Pulmonic Valve: The pulmonic valve  was normal in structure. Pulmonic valve regurgitation is mild. No evidence of pulmonic stenosis. Aorta: The aortic root is normal in size and structure. Venous: The inferior vena cava is normal in size with greater than 50% respiratory variability, suggesting right atrial pressure of 3 mmHg. IAS/Shunts: No atrial level shunt detected by color flow Doppler.  LEFT VENTRICLE PLAX 2D LVIDd:         4.70 cm   Diastology LVIDs:         3.20 cm   LV e' medial:    7.72 cm/s LV PW:         1.10 cm   LV E/e' medial:  13.1 LV IVS:        1.00 cm   LV e' lateral:   12.20 cm/s LVOT diam:     2.00 cm   LV E/e' lateral: 8.3 LV SV:         97 LV SV Index:   42 LVOT Area:     3.14 cm  RIGHT VENTRICLE             IVC RV Basal diam:  3.50 cm     IVC diam: 2.00 cm RV S prime:     11.00 cm/s TAPSE (M-mode): 2.8 cm LEFT ATRIUM             Index        RIGHT ATRIUM           Index LA diam:        4.20 cm 1.79 cm/m   RA Area:     23.50 cm LA Vol (A2C):   66.2 ml 28.25 ml/m  RA Volume:   77.60 ml  33.11 ml/m LA Vol (A4C):   58.3 ml 24.88 ml/m LA Biplane Vol: 65.8 ml 28.08 ml/m  AORTIC VALVE AV Area (Vmax):    2.41 cm AV Area (Vmean):   2.18 cm AV Area (VTI):     2.05 cm AV Vmax:           168.00 cm/s AV Vmean:          109.000 cm/s AV VTI:            0.474 m AV Peak Grad:      11.3 mmHg AV Mean Grad:      6.0 mmHg LVOT Vmax:         129.00 cm/s LVOT Vmean:        75.800 cm/s LVOT VTI:          0.310 m LVOT/AV VTI ratio: 0.65  AORTA Ao Root diam: 2.60 cm Ao Asc diam:  3.00 cm MITRAL VALVE                TRICUSPID VALVE MV Area (PHT): 2.93 cm     TR Peak grad:   30.5 mmHg MV Decel Time: 259 msec     TR Vmax:        276.00 cm/s MV E velocity: 101.00 cm/s MV A velocity: 55.20 cm/s   SHUNTS MV E/A ratio:  1.83         Systemic VTI:  0.31  m                             Systemic Diam: 2.00 cm Jenkins Rouge MD Electronically signed by Jenkins Rouge MD Signature Date/Time: 03/20/2022/12:02:52 PM    Final      Assessment and Plan:    #Acute Systolic Heart Failure with Mildly Reduced LVEF: Patient presents with progressive SOB and orthopnea found to have pulmonary edema on CXR and mildly reduced LVEF 45-50% on TTE concerning for new onset congestive heart failure. 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 She does have mild WMA on TTE, however trop negative with lower suspicion for SCAD. Patient is currently breast feeding and wishes to continue if possible which is reasonable given her EF is only mildly reduced. Will start hydralazine for afterload reduction and lasix for diuresis. Pending symptoms, could consider ischemic evaluation/evaluation for SCAD once more clinically compensated.  -Start hydralazine 25mg  TID for afterload reduction and up-titrate as needed -Give lasix 40mg  IV now and monitor response; re-dose as needed -Will add GDMT as tolerated knowing that she would prefer to breast feed at this time which is reasonable given EF is only mildly reduced -Trend trop -Once more clinically compensated, could consider ischemic/SCAD evaluation given mild WMA on TTE -Will need ongoing follow-up in [place] clinic  #Hypertension: SBP 170s on arrival. No prior history of HTN or pre-eclampsia. Denies HA.  -Check protein/Cr ratio -Start hydralazine as above and up-titrate as needed    Risk Assessment/Risk Scores:        New York Heart Association (NYHA) Functional Class NYHA Class III        For questions or updates, please contact [place] Please consult [email or web address] for contact info under    Signed, Freada Bergeron, MD  03/20/2022 1:53 PM

## 2022-03-20 NOTE — Progress Notes (Incomplete)
Progress Note  Patient Name: Priscilla Delgado Date of Encounter: 03/21/2022  [facility] Cardiologist: None   Subjective   Feels much better. Breathing back to baseline. Back pain improved with heating pack.  Blood pressure improved to 130s/70s Cr 0.8>1.11  Inpatient Medications    Scheduled Meds:  aspirin EC  81 mg Oral Daily   enoxaparin (LOVENOX) injection  70 mg Subcutaneous Q24H   hydrALAZINE  25 mg Oral Q8H   prenatal multivitamin  1 tablet Oral Daily   sodium chloride flush  3 mL Intravenous Q12H   Continuous Infusions:  sodium chloride     PRN Meds: sodium chloride, acetaminophen, ondansetron (ZOFRAN) IV, sodium chloride flush   Vital Signs    Vitals:   03/20/22 1620 03/20/22 1952 03/20/22 2158 03/21/22 0349  BP: 136/72 114/61 (!) 114/53 135/78  Pulse: (!) 56 (!) 59 63 (!) 52  Resp: 17 18 16 18   Temp: 98.2 F (36.8 C) 99.3 F (37.4 C) 98.2 F (36.8 C) 98.4 F (36.9 C)  TempSrc: Oral Oral Oral Oral  SpO2: 98% 98% 98% 97%  Weight:    133.1 kg  Height: 5\' 5"  (1.651 m)       Intake/Output Summary (Last 24 hours) at 03/21/2022 0646 Last data filed at 03/21/2022 0630 Gross per 24 hour  Intake 820 ml  Output 900 ml  Net -80 ml      03/21/2022    3:49 AM 03/13/2022    3:37 AM 03/12/2022    8:18 AM  Last 3 Weights  Weight (lbs) 293 lb 6.4 oz 305 lb 14.4 oz 308 lb 12.8 oz  Weight (kg) 133.085 kg 138.755 kg 140.071 kg      Telemetry    Sinus bradycardia - Personally Reviewed  ECG    No new tracing - Personally Reviewed  Physical Exam   GEN: No acute distress.   Neck: No JVD Cardiac: RRR, no murmurs, rubs, or gallops.  Respiratory: Clear to auscultation bilaterally. GI: Soft, nontender, non-distended  MS: Trace edema, warm Neuro:  Nonfocal  Psych: Normal affect   Labs    High Sensitivity Troponin:   Recent Labs  Lab 03/20/22 1041 03/20/22 1246  TROPONINIHS 7 10     Chemistry Recent Labs  Lab 03/20/22 1041 03/20/22 1050  03/20/22 1246 03/21/22 0440  NA 141 141  --  140  K 4.4 4.3  --  4.1  CL 113* 111  --  106  CO2 20*  --   --  23  GLUCOSE 83 80  --  88  BUN 13 13  --  14  CREATININE 0.90 0.80  --  1.11*  CALCIUM 8.6*  --   --  8.7*  PROT  --   --  5.8*  --   ALBUMIN  --   --  2.7*  --   AST  --   --  38  --   ALT  --   --  43  --   ALKPHOS  --   --  97  --   BILITOT  --   --  0.5  --   GFRNONAA >60  --   --  >60  ANIONGAP 8  --   --  11    Lipids No results for input(s): CHOL, TRIG, HDL, LABVLDL, LDLCALC, CHOLHDL in the last 168 hours.  Hematology Recent Labs  Lab 03/20/22 1041 03/20/22 1050  WBC 4.9  --   RBC 3.98  --   HGB  11.9* 11.9*  HCT 36.4 35.0*  MCV 91.5  --   MCH 29.9  --   MCHC 32.7  --   RDW 13.4  --   PLT 218  --    Thyroid No results for input(s): TSH, FREET4 in the last 168 hours.  BNP Recent Labs  Lab 03/20/22 1041  BNP 77.7    DDimer No results for input(s): DDIMER in the last 168 hours.   Radiology    CT Angio Chest PE W/Cm &/Or Wo Cm  Result Date: 03/20/2022 CLINICAL DATA:  High probability for acute pulmonary embolism. EXAM: CT ANGIOGRAPHY CHEST WITH CONTRAST TECHNIQUE: Multidetector CT imaging of the chest was performed using the standard protocol during bolus administration of intravenous contrast. Multiplanar CT image reconstructions and MIPs were obtained to evaluate the vascular anatomy. RADIATION DOSE REDUCTION: This exam was performed according to the departmental dose-optimization program which includes automated exposure control, adjustment of the mA and/or kV according to patient size and/or use of iterative reconstruction technique. CONTRAST:  OMNIPAQUE IOHEXOL 350 MG/ML SOLN COMPARISON:  None Available. FINDINGS: Cardiovascular: Diminished exam detail due to respiratory motion artifact and sub optimal pulmonary arterial opacification. Within this limitation there is no sign of lobar or segmental pulmonary artery filling defects. Mild cardiac  enlargement without pericardial effusion. Mediastinum/Nodes: No enlarged mediastinal, hilar, or axillary lymph nodes. Thyroid gland, trachea, and esophagus demonstrate no significant findings. Lungs/Pleura: There are small bilateral pleural effusions. Multifocal bilateral areas of ground-glass attenuation are seen within the left upper lobe and both lower lobes. No airspace consolidation or atelectasis. No signs of pneumothorax. Upper Abdomen: No acute abnormality. Reflux of contrast material is identified into the IVC and hepatic veins. Musculoskeletal: No chest wall abnormality. No acute or significant osseous findings. Review of the MIP images confirms the above findings. IMPRESSION: 1. Diminished exam detail due to respiratory motion artifact and sub optimal pulmonary arterial opacification. Within this limitation there is no evidence for lobar or segmental pulmonary artery filling defects to suggest a clinically significant pulmonary embolus. 2. Small bilateral pleural effusions. 3. Multifocal bilateral areas of ground-glass attenuation are noted within the left upper lobe and both lower lobes. Findings may reflect pulmonary edema or infection. 4. Reflux of contrast material into the IVC and hepatic veins. Although nonspecific this may be seen in the setting of right heart failure. Electronically Signed   By: Signa Kell M.D.   On: 03/20/2022 12:27   DG Chest Port 1 View  Result Date: 03/20/2022 CLINICAL DATA:  Short of breath.  Postpartum. EXAM: PORTABLE CHEST 1 VIEW COMPARISON:  12/25/2020. FINDINGS: Cardiac silhouette normal in size.  No mediastinal or hilar masses. Hazy perihilar and lower lung zone opacities, new since the prior exam, accentuated by positioning and low lung volumes. No convincing pleural effusion. No pneumothorax. Skeletal structures are grossly intact. IMPRESSION: 1. New hazy perihilar and lower lung zone airspace opacities suspected to be due to pulmonary edema. Electronically  Signed   By: Amie Portland M.D.   On: 03/20/2022 11:12   ECHOCARDIOGRAM COMPLETE  Result Date: 03/20/2022    ECHOCARDIOGRAM REPORT   Patient Name:   Priscilla Delgado Date of Exam: 03/20/2022 Medical Rec #:  [id number]      Height:       64.0 in Accession #:    [id number]     Weight:       305.9 lb Date of Birth:  Mar 04, 1981       BSA:  2.344 m Patient Age:    41 years       BP:           170/85 mmHg Patient Gender: F              HR:           41 bpm. Exam Location:  Inpatient Procedure: 2D Echo, Cardiac Doppler and Color Doppler                       STAT ECHO Reported to: Dr Charlton Haws on 03/20/2022 11:56:00 AM. Indications:    Cardiomegaly  History:        Patient has no prior history of Echocardiogram examinations.  Sonographer:    Ross Ludwig RDCS (AE) Referring Phys: [id number] LAURA A MURPHY  Sonographer Comments: Suboptimal subcostal window. Five days postpartum. IMPRESSIONS  1. Mid / basal inferior wall hypokinesis . Left ventricular ejection fraction, by estimation, is 45 to 50%. The left ventricle has mildly decreased function. The left ventricle has no regional wall motion abnormalities. The left ventricular internal cavity size was mildly dilated. Left ventricular diastolic parameters were normal.  2. Right ventricular systolic function is normal. The right ventricular size is normal.  3. Left atrial size was mildly dilated.  4. The pericardial effusion is posterior to the left ventricle.  5. The mitral valve is normal in structure. No evidence of mitral valve regurgitation. No evidence of mitral stenosis.  6. The aortic valve is tricuspid. Aortic valve regurgitation is not visualized. No aortic stenosis is present.  7. The inferior vena cava is normal in size with greater than 50% respiratory variability, suggesting right atrial pressure of 3 mmHg. FINDINGS  Left Ventricle: Mid / basal inferior wall hypokinesis. Left ventricular ejection fraction, by estimation, is 45 to 50%. The left ventricle has  mildly decreased function. The left ventricle has no regional wall motion abnormalities. The left ventricular internal cavity size was mildly dilated. There is no left ventricular hypertrophy. Left ventricular diastolic parameters were normal. Right Ventricle: The right ventricular size is normal. No increase in right ventricular wall thickness. Right ventricular systolic function is normal. Left Atrium: Left atrial size was mildly dilated. Right Atrium: Right atrial size was normal in size. Pericardium: Trivial pericardial effusion is present. The pericardial effusion is posterior to the left ventricle. Mitral Valve: The mitral valve is normal in structure. No evidence of mitral valve regurgitation. No evidence of mitral valve stenosis. Tricuspid Valve: The tricuspid valve is normal in structure. Tricuspid valve regurgitation is mild . No evidence of tricuspid stenosis. Aortic Valve: The aortic valve is tricuspid. Aortic valve regurgitation is not visualized. No aortic stenosis is present. Aortic valve mean gradient measures 6.0 mmHg. Aortic valve peak gradient measures 11.3 mmHg. Aortic valve area, by VTI measures 2.05  cm. Pulmonic Valve: The pulmonic valve was normal in structure. Pulmonic valve regurgitation is mild. No evidence of pulmonic stenosis. Aorta: The aortic root is normal in size and structure. Venous: The inferior vena cava is normal in size with greater than 50% respiratory variability, suggesting right atrial pressure of 3 mmHg. IAS/Shunts: No atrial level shunt detected by color flow Doppler.  LEFT VENTRICLE PLAX 2D LVIDd:         4.70 cm   Diastology LVIDs:         3.20 cm   LV e' medial:    7.72 cm/s LV PW:         1.10 cm  LV E/e' medial:  13.1 LV IVS:        1.00 cm   LV e' lateral:   12.20 cm/s LVOT diam:     2.00 cm   LV E/e' lateral: 8.3 LV SV:         97 LV SV Index:   42 LVOT Area:     3.14 cm  RIGHT VENTRICLE             IVC RV Basal diam:  3.50 cm     IVC diam: 2.00 cm RV S prime:      11.00 cm/s TAPSE (M-mode): 2.8 cm LEFT ATRIUM             Index        RIGHT ATRIUM           Index LA diam:        4.20 cm 1.79 cm/m   RA Area:     23.50 cm LA Vol (A2C):   66.2 ml 28.25 ml/m  RA Volume:   77.60 ml  33.11 ml/m LA Vol (A4C):   58.3 ml 24.88 ml/m LA Biplane Vol: 65.8 ml 28.08 ml/m  AORTIC VALVE AV Area (Vmax):    2.41 cm AV Area (Vmean):   2.18 cm AV Area (VTI):     2.05 cm AV Vmax:           168.00 cm/s AV Vmean:          109.000 cm/s AV VTI:            0.474 m AV Peak Grad:      11.3 mmHg AV Mean Grad:      6.0 mmHg LVOT Vmax:         129.00 cm/s LVOT Vmean:        75.800 cm/s LVOT VTI:          0.310 m LVOT/AV VTI ratio: 0.65  AORTA Ao Root diam: 2.60 cm Ao Asc diam:  3.00 cm MITRAL VALVE                TRICUSPID VALVE MV Area (PHT): 2.93 cm     TR Peak grad:   30.5 mmHg MV Decel Time: 259 msec     TR Vmax:        276.00 cm/s MV E velocity: 101.00 cm/s MV A velocity: 55.20 cm/s   SHUNTS MV E/A ratio:  1.83         Systemic VTI:  0.31 m                             Systemic Diam: 2.00 cm Charlton Haws MD Electronically signed by Charlton Haws MD Signature Date/Time: 03/20/2022/12:02:52 PM    Final     Cardiac Studies   TTE 03/20/22: IMPRESSIONS   1. Mid / basal inferior wall hypokinesis . Left ventricular ejection  fraction, by estimation, is 45 to 50%. The left ventricle has mildly  decreased function. The left ventricle has no regional wall motion  abnormalities. The left ventricular internal  cavity size was mildly dilated. Left ventricular diastolic parameters were  normal.   2. Right ventricular systolic function is normal. The right ventricular  size is normal.   3. Left atrial size was mildly dilated.   4. The pericardial effusion is posterior to the left ventricle.   5. The mitral valve is normal in structure. No evidence of mitral valve  regurgitation. No  evidence of mitral stenosis.   6. The aortic valve is tricuspid. Aortic valve regurgitation is not   visualized. No aortic stenosis is present.   7. The inferior vena cava is normal in size with greater than 50%  respiratory variability, suggesting right atrial pressure of 3 mmHg.   Patient Profile     41 y.o. female [id number] female who recently delivered on 03/14/22 at [id number]  is being seen 03/20/2022 for the evaluation of acute systolic heart failure   Assessment & Plan    #Acute Systolic Heart Failure with Mildly Reduced LVEF: Patient presents with progressive SOB and orthopnea found to have pulmonary edema on CXR and mildly reduced LVEF 45-50% on TTE concerning for new onset congestive heart failure. 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 She does have mild WMA on TTE, however trop negative with lower suspicion for SCAD. Patient is currently breast feeding and wishes to continue if possible which is reasonable given her EF is only mildly reduced. She was started on lasix and hydralazine with good response. Appears euvolemic today.  -Continue hydralazine 25mg  TID for afterload reduction and up-titrate as needed -Unable to tolerate BB due to bradycardia; can add in future if able -Can consider changing to ACE/ARB +spiro as outpatient although data for these medications is more limited in breast feeding -Will give lasix 20mg  PO daily prn as needed for weight gain, LE edema, SOB at home -No further IV diuresis today as patient euvolemic and Cr bumped slightly -Trop negative, low suspicion for ischemia/SCAD -Discussed she is higher risk for subsequent pregnancies and patient states she does not wish to become pregnant again   #Hypertension: SBP 170s on arrival. No prior history of HTN or pre-eclampsia. Protein to Cr ratio normal. Responding well to hydralazine. -Continue hydralazine 25mg  TID   For questions or updates, please contact [facility] Please consult [email or web address] for contact info  under        Signed, Meriam SpragueHeather E Duke Weisensel, MD  03/21/2022, 6:46 AM

## 2022-03-20 NOTE — ED Notes (Signed)
Report called to Coralyn Mark, RN on OB floor.

## 2022-03-20 NOTE — ED Provider Notes (Signed)
[facility] Provider Note   CSN: [id number] Arrival date & time: 03/20/22  1017     History  Chief Complaint  Patient presents with   Shortness of Breath   Pelvic Pain    Priscilla Delgado is a 41 y.o. female.  41 year old female with history of uneventful vaginal delivery 6 days ago otherwise no significant past medical history presents with complaint of chest pain and shortness of breath onset 3 days ago, progressively worsening.  Her chest discomfort is located in her upper and lower midsternal areas, radiates up into her back.  Shortness of breath is worse with lying supine or trying to take a deep breath.  Reports lower extremity edema which occurred at the end of her pregnancy without history of hypertension. Denies fevers, chills, URI symptoms, nausea, vomiting.      Home Medications Prior to Admission medications   Medication Sig Start Date End Date Taking? Authorizing Provider  acetaminophen (TYLENOL) 500 MG tablet Take 500 mg by mouth every 6 (six) hours as needed for mild pain.   Yes [provider]  ibuprofen (ADVIL) 600 MG tablet Take 1 tablet (600 mg total) by mouth every 6 (six) hours. Patient taking differently: Take 600 mg by mouth every 6 (six) hours as needed for mild pain. 03/15/22  Yes Pray, Milus Mallick, MD  Prenatal Vit-Fe Fumarate-FA (PRENATAL COMPLETE) 14-0.4 MG TABS Take 1 tablet by mouth daily. 02/05/19  Yes Thressa Sheller D, CNM  senna-docusate (SENOKOT-S) 8.6-50 MG tablet Take 2 tablets by mouth daily. Patient taking differently: Take 1 tablet by mouth at bedtime as needed for mild constipation. 03/15/22  Yes Pray, Milus Mallick, MD  cyclobenzaprine (FLEXERIL) 10 MG tablet Take 10 mg by mouth 3 (three) times daily as needed for muscle spasms.    [provider]      Allergies    Patient has no known allergies.    Review of Systems   Review of Systems Negative except as per HPI Physical Exam Updated  Vital Signs BP 126/73   Pulse (!) 48   Temp 98.2 F (36.8 C) (Oral)   Resp 20   Ht  (1.626 m)   LMP 07/01/2021 (Approximate)   SpO2 100%   Breastfeeding Yes   BMI 52.51 kg/m  Physical Exam Vitals and nursing note reviewed.  Constitutional:      General: She is not in acute distress.    Appearance: She is well-developed. She is ill-appearing. She is not diaphoretic.  HENT:     Head: Normocephalic and atraumatic.  Cardiovascular:     Rate and Rhythm: Regular rhythm. Bradycardia present.  Pulmonary:     Effort: Pulmonary effort is normal.     Breath sounds: Examination of the right-lower field reveals decreased breath sounds. Examination of the left-lower field reveals decreased breath sounds. Decreased breath sounds present.  Chest:     Chest wall: No tenderness.  Abdominal:     Palpations: Abdomen is soft.     Tenderness: There is no abdominal tenderness.  Musculoskeletal:     Cervical back: Neck supple.     Right lower leg: Edema present.     Left lower leg: Edema present.  Skin:    General: Skin is warm and dry.     Findings: No erythema or rash.  Neurological:     Mental Status: She is alert and oriented to person, place, and time.  Psychiatric:        Behavior: Behavior normal.  ED Results / Procedures / Treatments   Labs (all labs ordered are listed, but only abnormal results are displayed) Labs Reviewed  BASIC METABOLIC PANEL - Abnormal; Notable for the following components:      Result Value   Chloride 113 (*)    CO2 20 (*)    Calcium 8.6 (*)    All other components within normal limits  CBC WITH DIFFERENTIAL/PLATELET - Abnormal; Notable for the following components:   Hemoglobin 11.9 (*)    All other components within normal limits  HEPATIC FUNCTION PANEL - Abnormal; Notable for the following components:   Total Protein 5.8 (*)    Albumin 2.7 (*)    All other components within normal limits  I-STAT CHEM 8, ED - Abnormal; Notable for the  following components:   Calcium, Ion 1.13 (*)    TCO2 20 (*)    Hemoglobin 11.9 (*)    HCT 35.0 (*)    All other components within normal limits  BRAIN NATRIURETIC PEPTIDE  URINALYSIS, ROUTINE W REFLEX MICROSCOPIC  PROTEIN / CREATININE RATIO, URINE  TROPONIN I (HIGH SENSITIVITY)  TROPONIN I (HIGH SENSITIVITY)    EKG EKG Interpretation  Date/Time:  Saturday Mar 20 2022 10:29:34 EDT Ventricular Rate:  46 PR Interval:  146 QRS Duration: 85 QT Interval:  470 QTC Calculation: 412 R Axis:   73 Text Interpretation: Sinus bradycardia Probable left atrial enlargement Similar to previous Confirmed by Coralee PesaHorton, Kristie [phone]([id number]) on 03/20/2022 11:10:37 AM  Radiology CT Angio Chest PE W/Cm &/Or Wo Cm  Result Date: 03/20/2022 CLINICAL DATA:  High probability for acute pulmonary embolism. EXAM: CT ANGIOGRAPHY CHEST WITH CONTRAST TECHNIQUE: Multidetector CT imaging of the chest was performed using the standard protocol during bolus administration of intravenous contrast. Multiplanar CT image reconstructions and MIPs were obtained to evaluate the vascular anatomy. RADIATION DOSE REDUCTION: This exam was performed according to the departmental dose-optimization program which includes automated exposure control, adjustment of the mA and/or kV according to patient size and/or use of iterative reconstruction technique. CONTRAST:  100mL OMNIPAQUE IOHEXOL 350 MG/ML SOLN COMPARISON:  None Available. FINDINGS: Cardiovascular: Diminished exam detail due to respiratory motion artifact and sub optimal pulmonary arterial opacification. Within this limitation there is no sign of lobar or segmental pulmonary artery filling defects. Mild cardiac enlargement without pericardial effusion. Mediastinum/Nodes: No enlarged mediastinal, hilar, or axillary lymph nodes. Thyroid gland, trachea, and esophagus demonstrate no significant findings. Lungs/Pleura: There are small bilateral pleural effusions. Multifocal bilateral areas of  ground-glass attenuation are seen within the left upper lobe and both lower lobes. No airspace consolidation or atelectasis. No signs of pneumothorax. Upper Abdomen: No acute abnormality. Reflux of contrast material is identified into the IVC and hepatic veins. Musculoskeletal: No chest wall abnormality. No acute or significant osseous findings. Review of the MIP images confirms the above findings. IMPRESSION: 1. Diminished exam detail due to respiratory motion artifact and sub optimal pulmonary arterial opacification. Within this limitation there is no evidence for lobar or segmental pulmonary artery filling defects to suggest a clinically significant pulmonary embolus. 2. Small bilateral pleural effusions. 3. Multifocal bilateral areas of ground-glass attenuation are noted within the left upper lobe and both lower lobes. Findings may reflect pulmonary edema or infection. 4. Reflux of contrast material into the IVC and hepatic veins. Although nonspecific this may be seen in the setting of right heart failure. Electronically Signed   By: Signa Kellaylor  Stroud M.D.   On: 03/20/2022 12:27   [facility]  Result Date: 03/20/2022 CLINICAL DATA:  Short of breath.  Postpartum. EXAM: PORTABLE CHEST 1 VIEW COMPARISON:  12/25/2020. FINDINGS: Cardiac silhouette normal in size.  No mediastinal or hilar masses. Hazy perihilar and lower lung zone opacities, new since the prior exam, accentuated by positioning and low lung volumes. No convincing pleural effusion. No pneumothorax. Skeletal structures are grossly intact. IMPRESSION: 1. New hazy perihilar and lower lung zone airspace opacities suspected to be due to pulmonary edema. Electronically Signed   By: Amie Portland M.D.   On: 03/20/2022 11:12   ECHOCARDIOGRAM COMPLETE  Result Date: 03/20/2022    ECHOCARDIOGRAM REPORT   Patient Name:   Priscilla Delgado Date of Exam: 03/20/2022 Medical Rec #:  [id number]      Height:       64.0 in Accession #:    [id number]     Weight:        305.9 lb Date of Birth:  [birth year]       BSA:          2.344 m Patient Age:    41 years       BP:           170/85 mmHg Patient Gender: F              HR:           41 bpm. Exam Location:  Inpatient Procedure: 2D Echo, Cardiac Doppler and Color Doppler                       STAT ECHO Reported to: Dr Charlton Haws on 03/20/2022 11:56:00 AM. Indications:    Cardiomegaly  History:        Patient has no prior history of Echocardiogram examinations.  Sonographer:    Ross Ludwig RDCS (AE) Referring Phys: [id number] Schylar Wuebker A Magnum Lunde  Sonographer Comments: Suboptimal subcostal window. Five days postpartum. IMPRESSIONS  1. Mid / basal inferior wall hypokinesis . Left ventricular ejection fraction, by estimation, is 45 to 50%. The left ventricle has mildly decreased function. The left ventricle has no regional wall motion abnormalities. The left ventricular internal cavity size was mildly dilated. Left ventricular diastolic parameters were normal.  2. Right ventricular systolic function is normal. The right ventricular size is normal.  3. Left atrial size was mildly dilated.  4. The pericardial effusion is posterior to the left ventricle.  5. The mitral valve is normal in structure. No evidence of mitral valve regurgitation. No evidence of mitral stenosis.  6. The aortic valve is tricuspid. Aortic valve regurgitation is not visualized. No aortic stenosis is present.  7. The inferior vena cava is normal in size with greater than 50% respiratory variability, suggesting right atrial pressure of 3 mmHg. FINDINGS  Left Ventricle: Mid / basal inferior wall hypokinesis. Left ventricular ejection fraction, by estimation, is 45 to 50%. The left ventricle has mildly decreased function. The left ventricle has no regional wall motion abnormalities. The left ventricular internal cavity size was mildly dilated. There is no left ventricular hypertrophy. Left ventricular diastolic parameters were normal. Right Ventricle: The right ventricular  size is normal. No increase in right ventricular wall thickness. Right ventricular systolic function is normal. Left Atrium: Left atrial size was mildly dilated. Right Atrium: Right atrial size was normal in size. Pericardium: Trivial pericardial effusion is present. The pericardial effusion is posterior to the left ventricle. Mitral Valve: The mitral valve is normal in structure. No evidence of mitral valve regurgitation. No evidence of  mitral valve stenosis. Tricuspid Valve: The tricuspid valve is normal in structure. Tricuspid valve regurgitation is mild . No evidence of tricuspid stenosis. Aortic Valve: The aortic valve is tricuspid. Aortic valve regurgitation is not visualized. No aortic stenosis is present. Aortic valve mean gradient measures 6.0 mmHg. Aortic valve peak gradient measures 11.3 mmHg. Aortic valve area, by VTI measures 2.05  cm. Pulmonic Valve: The pulmonic valve was normal in structure. Pulmonic valve regurgitation is mild. No evidence of pulmonic stenosis. Aorta: The aortic root is normal in size and structure. Venous: The inferior vena cava is normal in size with greater than 50% respiratory variability, suggesting right atrial pressure of 3 mmHg. IAS/Shunts: No atrial level shunt detected by color flow Doppler.  LEFT VENTRICLE PLAX 2D LVIDd:         4.70 cm   Diastology LVIDs:         3.20 cm   LV e' medial:    7.72 cm/s LV PW:         1.10 cm   LV E/e' medial:  13.1 LV IVS:        1.00 cm   LV e' lateral:   12.20 cm/s LVOT diam:     2.00 cm   LV E/e' lateral: 8.3 LV SV:         97 LV SV Index:   42 LVOT Area:     3.14 cm  RIGHT VENTRICLE             IVC RV Basal diam:  3.50 cm     IVC diam: 2.00 cm RV S prime:     11.00 cm/s TAPSE (M-mode): 2.8 cm LEFT ATRIUM             Index        RIGHT ATRIUM           Index LA diam:        4.20 cm 1.79 cm/m   RA Area:     23.50 cm LA Vol (A2C):   66.2 ml 28.25 ml/m  RA Volume:   77.60 ml  33.11 ml/m LA Vol (A4C):   58.3 ml 24.88 ml/m LA Biplane  Vol: 65.8 ml 28.08 ml/m  AORTIC VALVE AV Area (Vmax):    2.41 cm AV Area (Vmean):   2.18 cm AV Area (VTI):     2.05 cm AV Vmax:           168.00 cm/s AV Vmean:          109.000 cm/s AV VTI:            0.474 m AV Peak Grad:      11.3 mmHg AV Mean Grad:      6.0 mmHg LVOT Vmax:         129.00 cm/s LVOT Vmean:        75.800 cm/s LVOT VTI:          0.310 m LVOT/AV VTI ratio: 0.65  AORTA Ao Root diam: 2.60 cm Ao Asc diam:  3.00 cm MITRAL VALVE                TRICUSPID VALVE MV Area (PHT): 2.93 cm     TR Peak grad:   30.5 mmHg MV Decel Time: 259 msec     TR Vmax:        276.00 cm/s MV E velocity: 101.00 cm/s MV A velocity: 55.20 cm/s   SHUNTS MV E/A ratio:  1.83  Systemic VTI:  0.31 m                             Systemic Diam: 2.00 cm Charlton Haws MD Electronically signed by Charlton Haws MD Signature Date/Time: 03/20/2022/12:02:52 PM    Final     Procedures .Critical Care Performed by: Jeannie Fend, PA-C Authorized by: Jeannie Fend, PA-C   Critical care provider statement:    Critical care time (minutes):  30   Critical care was time spent personally by me on the following activities:  Development of treatment plan with patient or surrogate, discussions with consultants, evaluation of patient's response to treatment, examination of patient, ordering and review of laboratory studies, ordering and review of radiographic studies, ordering and performing treatments and interventions, pulse oximetry, re-evaluation of patient's condition and review of old charts    Medications Ordered in ED Medications  hydrALAZINE (APRESOLINE) tablet 25 mg (25 mg Oral Given 03/20/22 1337)  iohexol (OMNIPAQUE) 350 MG/ML injection 100 mL (100 mLs Intravenous Contrast Given 03/20/22 1214)  furosemide (LASIX) injection 40 mg (40 mg Intravenous Given 03/20/22 1307)    ED Course/ Medical Decision Making/ A&P                           Medical Decision Making Amount and/or Complexity of Data Reviewed Labs:  ordered. Radiology: ordered.  Risk Prescription drug management. Decision regarding hospitalization.   This patient presents to the ED for concern of CP and SHOB, this involves an extensive number of treatment options, and is a complaint that carries with it a high risk of complications and morbidity.  The differential diagnosis includes but not limited to PE, ACS, dissection, eclampsia, CHF   Co morbidities that complicate the patient evaluation  Recent delivery of healthy neonate 6 days ago   Additional history obtained:  External records from outside source obtained and reviewed including DC summary from 03/15/22 which notes vaginal delivery without complications, uncomplicated postpartum course. BP normal/low (104/50s), Hrs 70s GBS negative 03/12/22   Lab Tests:  I Ordered, and personally interpreted labs.  The pertinent results include: CBC within normal limits, BMP without significant findings.  BNP normal at 77.7.  Initial troponin is 7, repeat 10.  Hepatic function added on and is without significant findings.   Imaging Studies ordered:  I ordered imaging studies including chest x-ray, CTA for PE, echo I independently visualized and interpreted imaging which showed chest x-ray suggesting volume overload. I agree with the radiologist interpretation.  CT read as negative for PE.  Echo with EF of 45-50 with small pericardial effusion.   Cardiac Monitoring: / EKG:  The patient was maintained on a cardiac monitor.  I personally viewed and interpreted the cardiac monitored which showed an underlying rhythm of: Sinus bradycardia, rate 46   Consultations Obtained:  I requested consultation with the cardiologist, Dr. Shari Prows who will order nitro drip and Lasix, recommends add on protein to creatinine ratio, cardiology will follow, OB to admit. Consult with Dr. Shawnie Pons with OB who is familiar with this patient, will admit.   Problem List / ED Course / Critical interventions /  Medication management  41 year old female G6, P6 presents with complaint of shortness of breath and chest pain 6 days postpartum, onset 3 days ago.  Patient delivered a healthy newborn, is breast-feeding, delivery and pregnancy were uncomplicated.  She says that she does have some lower extremity  swelling which was present on the end of her pregnancy without history of preeclampsia.  Reports orthopnea, pain in her upper and lower sternal areas which radiates into her back and is progressively worsening.  On exam, she has diminished lung sounds bilaterally in the lower fields, lower extremity edema, her abdomen is soft and nontender.  She denies visual disturbance or headaches. Patient found to be bradycardic and hypertensive while in the ER. Labs were ordered as well as CTA for PE concerns post partum and echo with concern for heart failure. CXR concerning for volume overload, CTA negative for PE, echo with EF 45-50% with small pericardial effusion. Cardiology was consulted and will order lasix for volume overload and nitro for BP management. Cardiology to follow patient during her admission. OB consulted and will admit. Patient is breastfeeding, last fed just prior to starting medications, was advised to hold off on further feedings until treatment plan discussed with OB, can be offered a pump if needed. Patient is comfortable with this plan and is comfortable feeding formula.  BP improved with meds I have reviewed the patients home medicines and have made adjustments as needed   Social Determinants of Health:  Has OB care   Test / Admission - Considered:  Admitted for further work-up and monitoring         Final Clinical Impression(s) / ED Diagnoses Final diagnoses:  Acute systolic heart failure (HCC)  Postpartum hypertension    Rx / DC Orders ED Discharge Orders     None         Jeannie Fend, PA-C 03/20/22 1518    Horton, Clabe Seal, DO 03/20/22 1700

## 2022-03-20 NOTE — H&P (Signed)
Priscilla Delgado is an 41 y.o. [id number] female.   Chief Complaint: Chest pain HPI: Patient is 6 days postpartum from a VBAC at term.  She has been normotensive and has a history of preeclampsia.  She 3 days ago developed chest pain, shortness of breath, orthopnea that have progressively gotten worse.  She reports her chest pain is substernal.  She arrived to the ED and was found to be moderately hypertensive with oxygen saturations of 97% on room air.  Chest x-ray showed bilateral pulmonary edema.  Echo done in the ED showed abnormal wall function and an EF of 45 to 50%.  CT angio ruled out PE and did show bilateral pleural effusions and pulmonary edema.  Cardiology was consulted and we were asked to admit.  Past Medical History:  Diagnosis Date   Ulcer of the stomach and intestine     Past Surgical History:  Procedure Laterality Date   CESAREAN SECTION      Family History  Problem Relation Age of Onset   ADD / ADHD Neg Hx    Diabetes Neg Hx    Hypertension Neg Hx    Social History:  reports that she has never smoked. She has never used smokeless tobacco. She reports current alcohol use of about 1.0 standard drink per week. She reports that she does not use drugs.  Allergies: No Known Allergies  Medications Prior to Admission  Medication Sig Dispense Refill   acetaminophen (TYLENOL) 500 MG tablet Take 500 mg by mouth every 6 (six) hours as needed for mild pain.     ibuprofen (ADVIL) 600 MG tablet Take 1 tablet (600 mg total) by mouth every 6 (six) hours. (Patient taking differently: Take 600 mg by mouth every 6 (six) hours as needed for mild pain.) 30 tablet 0   Prenatal Vit-Fe Fumarate-FA (PRENATAL COMPLETE) 14-0.4 MG TABS Take 1 tablet by mouth daily. 30 each 2   senna-docusate (SENOKOT-S) 8.6-50 MG tablet Take 2 tablets by mouth daily. (Patient taking differently: Take 1 tablet by mouth at bedtime as needed for mild constipation.)     cyclobenzaprine (FLEXERIL) 10 MG tablet Take 10 mg  by mouth 3 (three) times daily as needed for muscle spasms.      Pertinent items are noted in HPI.  And are otherwise negative  Blood pressure (!) 146/81, pulse (!) 49, temperature 98.2 F (36.8 C), temperature source Oral, resp. rate 20, height 5\' 4"  (1.626 m), last menstrual period 07/01/2021, SpO2 100 %, currently breastfeeding. Vitals:   03/20/22 1440 03/20/22 1500 03/20/22 1548 03/20/22 1620  BP: 126/73 (!) 146/81 136/72 136/72  Pulse: (!) 48 (!) 49 (!) 47 (!) 56  Resp: 20  18 17   Temp: 98.2 F (36.8 C)  98.5 F (36.9 C) 98.2 F (36.8 C)  TempSrc: Oral  Oral Oral  SpO2: 100%   98%  Height:   5\' 5"  (1.651 m) 5\' 5"  (1.651 m)     BP 136/72 (BP Location: Left Arm)   Pulse (!) 56   Temp 98.2 F (36.8 C) (Oral)   Resp 17   Ht 5\' 5"  (1.651 m)   LMP 07/01/2021 (Approximate)   SpO2 98%   Breastfeeding Yes   BMI 50.90 kg/m  General appearance: alert, cooperative, and appears stated age Head: Normocephalic, without obvious abnormality, atraumatic Neck: no JVD and supple, symmetrical, trachea midline Lungs:  few rales Heart:  Bradycardic, no murmur Abdomen: soft, non-tender; bowel sounds normal; no masses,  no organomegaly Extremities: edema trace Skin:  Skin color, texture, turgor normal. No rashes or lesions Neurologic: Grossly normal  Labs: Results for orders placed or performed during the hospital encounter of 03/20/22 (from the past 24 hour(s))  Basic metabolic panel     Status: Abnormal   Collection Time: 03/20/22 10:41 AM  Result Value Ref Range   Sodium 141 135 - 145 mmol/L   Potassium 4.4 3.5 - 5.1 mmol/L   Chloride 113 (H) 98 - 111 mmol/L   CO2 20 (L) 22 - 32 mmol/L   Glucose, Bld 83 70 - 99 mg/dL   BUN 13 6 - 20 mg/dL   Creatinine, Ser 0.90 0.44 - 1.00 mg/dL   Calcium 8.6 (L) 8.9 - 10.3 mg/dL   GFR, Estimated >60 >60 mL/min   Anion gap 8 5 - 15  CBC with Differential     Status: Abnormal   Collection Time: 03/20/22 10:41 AM  Result Value Ref Range   WBC  4.9 4.0 - 10.5 K/uL   RBC 3.98 3.87 - 5.11 MIL/uL   Hemoglobin 11.9 (L) 12.0 - 15.0 g/dL   HCT 36.4 36.0 - 46.0 %   MCV 91.5 80.0 - 100.0 fL   MCH 29.9 26.0 - 34.0 pg   MCHC 32.7 30.0 - 36.0 g/dL   RDW 13.4 11.5 - 15.5 %   Platelets 218 150 - 400 K/uL   nRBC 0.0 0.0 - 0.2 %   Neutrophils Relative % 66 %   Neutro Abs 3.2 1.7 - 7.7 K/uL   Lymphocytes Relative 24 %   Lymphs Abs 1.2 0.7 - 4.0 K/uL   Monocytes Relative 7 %   Monocytes Absolute 0.3 0.1 - 1.0 K/uL   Eosinophils Relative 2 %   Eosinophils Absolute 0.1 0.0 - 0.5 K/uL   Basophils Relative 1 %   Basophils Absolute 0.0 0.0 - 0.1 K/uL   Immature Granulocytes 0 %   Abs Immature Granulocytes 0.02 0.00 - 0.07 K/uL  Brain natriuretic peptide     Status: None   Collection Time: 03/20/22 10:41 AM  Result Value Ref Range   B Natriuretic Peptide 77.7 0.0 - 100.0 pg/mL  Troponin I (High Sensitivity)     Status: None   Collection Time: 03/20/22 10:41 AM  Result Value Ref Range   Troponin I (High Sensitivity) 7 <18 ng/L  I-stat chem 8, ED     Status: Abnormal   Collection Time: 03/20/22 10:50 AM  Result Value Ref Range   Sodium 141 135 - 145 mmol/L   Potassium 4.3 3.5 - 5.1 mmol/L   Chloride 111 98 - 111 mmol/L   BUN 13 6 - 20 mg/dL   Creatinine, Ser 0.80 0.44 - 1.00 mg/dL   Glucose, Bld 80 70 - 99 mg/dL   Calcium, Ion 1.13 (L) 1.15 - 1.40 mmol/L   TCO2 20 (L) 22 - 32 mmol/L   Hemoglobin 11.9 (L) 12.0 - 15.0 g/dL   HCT 35.0 (L) 36.0 - 46.0 %  Hepatic function panel     Status: Abnormal   Collection Time: 03/20/22 12:46 PM  Result Value Ref Range   Total Protein 5.8 (L) 6.5 - 8.1 g/dL   Albumin 2.7 (L) 3.5 - 5.0 g/dL   AST 38 15 - 41 U/L   ALT 43 0 - 44 U/L   Alkaline Phosphatase 97 38 - 126 U/L   Total Bilirubin 0.5 0.3 - 1.2 mg/dL   Bilirubin, Direct 0.1 0.0 - 0.2 mg/dL   Indirect Bilirubin 0.4 0.3 - 0.9 mg/dL  Troponin I (High Sensitivity)     Status: None   Collection Time: 03/20/22 12:46 PM  Result Value Ref Range    Troponin I (High Sensitivity) 10 <18 ng/L  Urinalysis, Routine w reflex microscopic     Status: Abnormal   Collection Time: 03/20/22  1:30 PM  Result Value Ref Range   Color, Urine COLORLESS (A) YELLOW   APPearance CLEAR CLEAR   Specific Gravity, Urine 1.015 1.005 - 1.030   pH 7.0 5.0 - 8.0   Glucose, UA NEGATIVE NEGATIVE mg/dL   Hgb urine dipstick SMALL (A) NEGATIVE   Bilirubin Urine NEGATIVE NEGATIVE   Ketones, ur NEGATIVE NEGATIVE mg/dL   Protein, ur NEGATIVE NEGATIVE mg/dL   Nitrite NEGATIVE NEGATIVE   Leukocytes,Ua NEGATIVE NEGATIVE   WBC, UA 0-5 0 - 5 WBC/hpf   Bacteria, UA NONE SEEN NONE SEEN   Squamous Epithelial / LPF 0-5 0 - 5   Radiology: CT Angio Chest PE W/Cm &/Or Wo Cm  Result Date: 03/20/2022 CLINICAL DATA:  High probability for acute pulmonary embolism. EXAM: CT ANGIOGRAPHY CHEST WITH CONTRAST TECHNIQUE: Multidetector CT imaging of the chest was performed using the standard protocol during bolus administration of intravenous contrast. Multiplanar CT image reconstructions and MIPs were obtained to evaluate the vascular anatomy. RADIATION DOSE REDUCTION: This exam was performed according to the departmental dose-optimization program which includes automated exposure control, adjustment of the mA and/or kV according to patient size and/or use of iterative reconstruction technique. CONTRAST:  OMNIPAQUE IOHEXOL 350 MG/ML SOLN COMPARISON:  None Available. FINDINGS: Cardiovascular: Diminished exam detail due to respiratory motion artifact and sub optimal pulmonary arterial opacification. Within this limitation there is no sign of lobar or segmental pulmonary artery filling defects. Mild cardiac enlargement without pericardial effusion. Mediastinum/Nodes: No enlarged mediastinal, hilar, or axillary lymph nodes. Thyroid gland, trachea, and esophagus demonstrate no significant findings. Lungs/Pleura: There are small bilateral pleural effusions. Multifocal bilateral areas of  ground-glass attenuation are seen within the left upper lobe and both lower lobes. No airspace consolidation or atelectasis. No signs of pneumothorax. Upper Abdomen: No acute abnormality. Reflux of contrast material is identified into the IVC and hepatic veins. Musculoskeletal: No chest wall abnormality. No acute or significant osseous findings. Review of the MIP images confirms the above findings. IMPRESSION: 1. Diminished exam detail due to respiratory motion artifact and sub optimal pulmonary arterial opacification. Within this limitation there is no evidence for lobar or segmental pulmonary artery filling defects to suggest a clinically significant pulmonary embolus. 2. Small bilateral pleural effusions. 3. Multifocal bilateral areas of ground-glass attenuation are noted within the left upper lobe and both lower lobes. Findings may reflect pulmonary edema or infection. 4. Reflux of contrast material into the IVC and hepatic veins. Although nonspecific this may be seen in the setting of right heart failure. Electronically Signed   By: Signa Kell M.D.   On: 03/20/2022 12:27   DG Chest Port 1 View  Result Date: 03/20/2022 CLINICAL DATA:  Short of breath.  Postpartum. EXAM: PORTABLE CHEST 1 VIEW COMPARISON:  12/25/2020. FINDINGS: Cardiac silhouette normal in size.  No mediastinal or hilar masses. Hazy perihilar and lower lung zone opacities, new since the prior exam, accentuated by positioning and low lung volumes. No convincing pleural effusion. No pneumothorax. Skeletal structures are grossly intact. IMPRESSION: 1. New hazy perihilar and lower lung zone airspace opacities suspected to be due to pulmonary edema. Electronically Signed   By: Amie Portland M.D.   On: 03/20/2022 11:12  [place] MFM FETAL BPP WO NON STRESS  Result Date: 03/08/2022 ----------------------------------------------------------------------  OBSTETRICS REPORT                       (Signed Final 03/08/2022 08:39 am)  ---------------------------------------------------------------------- Patient Info  ID #:       [id number]                          D.O.B.:  December 17, 1980 (41 yrs)  Name:       Sherlynn Stalls Banet                  Visit Date: 03/08/2022 08:23 am ---------------------------------------------------------------------- Performed By  Attending:        Johnell Comings MD         Ref. Address:     [address]                                                             [place], [place]  Performed By:     Benson Norway          [facility]                                     [facility]                                                             [place] for                                                             Women  Referred By:      [facility]                    for [facility] ---------------------------------------------------------------------- Orders  #  Description                           Code        Ordered By  1  [place] MFM OB FOLLOW UP                   [phone]    [place]  BOOKER  2  [place] MFM FETAL BPP WO NON               [id number]    [place]AN     STRESS                                            BOOKER ----------------------------------------------------------------------  #  Order #                     Accession #                Episode #  1  [id number]                   [id number]  2  [id number]                   [id number]                 [id number] ---------------------------------------------------------------------- Indications  Obesity complicating pregnancy, third          O99.213  trimester BMI 29  Advanced maternal age multigravida 76+,        O65.523  third trimester (39)  [redacted] weeks gestation of pregnancy                Z3A.36  Previous cesarean delivery, antepartum         O34.219  (VBAC x 4)  Neg AFP  ---------------------------------------------------------------------- Fetal Evaluation  Num Of Fetuses:         1  Fetal Heart Rate(bpm):  145  Cardiac Activity:       Observed  Presentation:           Cephalic  Placenta:               Posterior  P. Cord Insertion:      Previously Visualized  Amniotic Fluid  AFI FV:      Within normal limits  AFI Sum(cm)     %Tile       Largest Pocket(cm)  10              22          3.1  RUQ(cm)       RLQ(cm)       LUQ(cm)        LLQ(cm)  1.6           3             2.3            3.1 ---------------------------------------------------------------------- Biometry  BPD:      85.4  mm     G. Age:  34w 3d         14  %    CI:        70.56   %    70 - 86                                                          FL/HC:      21.9   %  20.1 - 22.1  HC:      324.1  mm     G. Age:  36w 5d         32  %    HC/AC:      0.95        0.93 - 1.11  AC:      340.3  mm     G. Age:  38w 0d         95  %    FL/BPD:     83.0   %    71 - 87  FL:       70.9  mm     G. Age:  36w 2d         51  %    FL/AC:      20.8   %    20 - 24  Est. FW:    3087  gm    6 lb 13 oz      75  % ---------------------------------------------------------------------- OB History  Gravidity:    6         Term:   5        Prem:   0        SAB:   0  TOP:          0       Ectopic:  0        Living: 5 ---------------------------------------------------------------------- Gestational Age  LMP:           35w 5d        Date:  07/01/21                 EDD:   04/07/22  U/S Today:     36w 3d                                        EDD:   04/02/22  Best:          36w 1d     Det. By:  Loman Chroman         EDD:   04/04/22                                      (08/09/21) ---------------------------------------------------------------------- Anatomy  Cranium:               Appears normal         Aortic Arch:            Previously seen  Cavum:                 Previously seen        Ductal Arch:            Previously seen  Ventricles:             Previously seen        Diaphragm:              Previously seen  Choroid Plexus:        Previously seen        Stomach:                Appears normal, left  sided  Cerebellum:            Previously seen        Abdomen:                Appears normal  Posterior Fossa:       Previously seen        Abdominal Wall:         Previously seen  Nuchal Fold:           Previously seen        Cord Vessels:           Previously seen  Face:                  Orbits and profile     Kidneys:                Appear normal                         previously seen  Lips:                  Previously seen        Bladder:                Appears normal  Thoracic:              Appears normal         Spine:                  Previously seen  Heart:                 Previously seen        Upper Extremities:      Previously seen  RVOT:                  Previously seen        Lower Extremities:      Previously seen  LVOT:                  Previously seen  Other:  Heels/feet and open hand/5th digit, nasal bone, lenses, 3VV, 3VTV          and VC previously visualized. Female gender previously seen. ---------------------------------------------------------------------- Comments  This patient was seen for a BPP due to maternal obesity with  a BMI of 48.  She denies any problems since her last exam  and has screened negative for gestational diabetes.  The fetal growth and amniotic fluid level appears appropriate  for her gestational age.  A biophysical profile performed today was 8 out of 8.  The patient is already scheduled for weekly fetal testing in  your office for the next 2 weeks.  No further exams were scheduled in our office. ----------------------------------------------------------------------                   Johnell Comings, MD Electronically Signed Final Report   03/08/2022 08:39 am ----------------------------------------------------------------------  [place] MFM FETAL BPP  WO NON STRESS  Result Date: 03/02/2022 ----------------------------------------------------------------------  OBSTETRICS REPORT                       (Signed Final 03/02/2022 08:20 am) ---------------------------------------------------------------------- Patient Info  ID #:       [id number]                          D.O.B.:  Mar 14, 1981 (  41 yrs)  Name:       JOEANN Mandelbaum                  Visit Date: 03/02/2022 07:31 am ---------------------------------------------------------------------- Performed By  Attending:        Johnell Comings MD         Ref. Address:     [address]                                                             [place], [place]  Performed By:     Jacob Moores BS,       Location[facility]         [facility], RVT                                [facility] at                                                             [place] [facility]                                                             Women  Referred By:      [facility]                    for Women ---------------------------------------------------------------------- Orders  #  Description                           Code        Ordered By  1  [place] MFM FETAL BPP WO NON               76819.01    [facility]     STRESS ----------------------------------------------------------------------  #  Order #                     Accession #                Episode #  1  [id number]                   [id number]                 [id number] ---------------------------------------------------------------------- Indications  Obesity complicating pregnancy, third  [id number]  trimester BMI 62  Advanced maternal age multigravida 24+,        O52.523  third trimester (39)  [redacted] weeks gestation of pregnancy                Z3A.35  Previous cesarean delivery, antepartum         O34.219  (VBAC x 4)  Neg AFP  Encounter for other antenatal screening        Z36.2  follow-up  ---------------------------------------------------------------------- Fetal Evaluation  Num Of Fetuses:         1  Fetal Heart Rate(bpm):  145  Cardiac Activity:       Observed  Presentation:           Cephalic  Placenta:               Posterior  P. Cord Insertion:      Previously Visualized  Amniotic Fluid  AFI FV:      Within normal limits  AFI Sum(cm)     %Tile       Largest Pocket(cm)  11.1            29          5   RUQ(cm)       RLQ(cm)       LUQ(cm)        LLQ(cm)  1             5             2.7            2.4 ---------------------------------------------------------------------- Biophysical Evaluation  Amniotic F.V:   Pocket => 2 cm             F. Tone:        Observed  F. Movement:    Observed                   Score:          8/8  F. Breathing:   Observed ---------------------------------------------------------------------- OB History  Gravidity:    6         Term:   5        Prem:   0        SAB:   0  TOP:          0       Ectopic:  0        Living: 5 ---------------------------------------------------------------------- Gestational Age  LMP:           34w 6d        Date:  07/01/21                 EDD:   04/07/22  Best:          Barbie Haggis 2d     Det. ByLoman Chroman         EDD:   04/04/22                                      (08/09/21) ---------------------------------------------------------------------- Anatomy  Ventricles:            Appears normal         Stomach:                Appears normal, left  sided  Thoracic:              Appears normal         Kidneys:                Appear normal  Diaphragm:             Appears normal         Bladder:                Appears normal  Other:  Technicallly difficult due to advanced GA, maternal habitus, and fetal          position. ---------------------------------------------------------------------- Cervix Uterus Adnexa  Cervix  Not visualized (advanced GA >24wks)  Uterus  Possible  pedunculated fibroid in right adnexa,  superior to, and separate from right ovary.  Right Ovary  Size(cm)       1.7  x   2.5    x  2.1       Vol(ml): 4.67  Within normal limits.  Left Ovary  Size(cm)       4.1  x   1.7    x  3.8       Vol(ml): 13.87  Within normal limits.  Cul De Sac  No free fluid seen.  Adnexa  Right adnexal possible pedunculated fibroid  noted 5.6 x 2.9 x 5.1 cm. ---------------------------------------------------------------------- Comments  This patient was seen for a BPP due to maternal obesity with  a BMI of 48.  She denies any problems since her last exam  and has screened negative for gestational diabetes.  A biophysical profile performed today was 8 out of 8.  There was normal amniotic fluid noted on today's ultrasound  exam.  She will return in 1 week for another BPP and growth scan. ----------------------------------------------------------------------                   Johnell Comings, MD Electronically Signed Final Report   03/02/2022 08:20 am ----------------------------------------------------------------------  ECHOCARDIOGRAM COMPLETE  Result Date: 03/20/2022    ECHOCARDIOGRAM REPORT   Patient Name:   Makynzie Dauphinais Date of Exam: 03/20/2022 Medical Rec #:  [id number]      Height:       64.0 in Accession #:    [id number]     Weight:       305.9 lb Date of Birth:  February 17, 1981       BSA:          2.344 m Patient Age:    26 years       BP:           170/85 mmHg Patient Gender: F              HR:           41 bpm. Exam Location:  Inpatient Procedure: 2D Echo, Cardiac Doppler and Color Doppler                       STAT ECHO Reported to: Dr Jenkins Rouge on 03/20/2022 11:56:00 AM. Indications:    Cardiomegaly  History:        Patient has no prior history of Echocardiogram examinations.  Sonographer:    Clayton Lefort RDCS (AE) Referring Phys: [id number] LAURA A MURPHY  Sonographer Comments: Suboptimal subcostal window. Five days postpartum. IMPRESSIONS  1. Mid / basal inferior wall hypokinesis . Left  ventricular ejection fraction, by estimation, is 45 to 50%. The left ventricle has mildly decreased function. The left  ventricle has no regional wall motion abnormalities. The left ventricular internal cavity size was mildly dilated. Left ventricular diastolic parameters were normal.  2. Right ventricular systolic function is normal. The right ventricular size is normal.  3. Left atrial size was mildly dilated.  4. The pericardial effusion is posterior to the left ventricle.  5. The mitral valve is normal in structure. No evidence of mitral valve regurgitation. No evidence of mitral stenosis.  6. The aortic valve is tricuspid. Aortic valve regurgitation is not visualized. No aortic stenosis is present.  7. The inferior vena cava is normal in size with greater than 50% respiratory variability, suggesting right atrial pressure of 3 mmHg. FINDINGS  Left Ventricle: Mid / basal inferior wall hypokinesis. Left ventricular ejection fraction, by estimation, is 45 to 50%. The left ventricle has mildly decreased function. The left ventricle has no regional wall motion abnormalities. The left ventricular internal cavity size was mildly dilated. There is no left ventricular hypertrophy. Left ventricular diastolic parameters were normal. Right Ventricle: The right ventricular size is normal. No increase in right ventricular wall thickness. Right ventricular systolic function is normal. Left Atrium: Left atrial size was mildly dilated. Right Atrium: Right atrial size was normal in size. Pericardium: Trivial pericardial effusion is present. The pericardial effusion is posterior to the left ventricle. Mitral Valve: The mitral valve is normal in structure. No evidence of mitral valve regurgitation. No evidence of mitral valve stenosis. Tricuspid Valve: The tricuspid valve is normal in structure. Tricuspid valve regurgitation is mild . No evidence of tricuspid stenosis. Aortic Valve: The aortic valve is tricuspid. Aortic valve  regurgitation is not visualized. No aortic stenosis is present. Aortic valve mean gradient measures 6.0 mmHg. Aortic valve peak gradient measures 11.3 mmHg. Aortic valve area, by VTI measures 2.05  cm. Pulmonic Valve: The pulmonic valve was normal in structure. Pulmonic valve regurgitation is mild. No evidence of pulmonic stenosis. Aorta: The aortic root is normal in size and structure. Venous: The inferior vena cava is normal in size with greater than 50% respiratory variability, suggesting right atrial pressure of 3 mmHg. IAS/Shunts: No atrial level shunt detected by color flow Doppler.  LEFT VENTRICLE PLAX 2D LVIDd:         4.70 cm   Diastology LVIDs:         3.20 cm   LV e' medial:    7.72 cm/s LV PW:         1.10 cm   LV E/e' medial:  13.1 LV IVS:        1.00 cm   LV e' lateral:   12.20 cm/s LVOT diam:     2.00 cm   LV E/e' lateral: 8.3 LV SV:         97 LV SV Index:   42 LVOT Area:     3.14 cm  RIGHT VENTRICLE             IVC RV Basal diam:  3.50 cm     IVC diam: 2.00 cm RV S prime:     11.00 cm/s TAPSE (M-mode): 2.8 cm LEFT ATRIUM             Index        RIGHT ATRIUM           Index LA diam:        4.20 cm 1.79 cm/m   RA Area:     23.50 cm LA Vol (A2C):   66.2 ml 28.25 ml/m  RA  Volume:   77.60 ml  33.11 ml/m LA Vol (A4C):   58.3 ml 24.88 ml/m LA Biplane Vol: 65.8 ml 28.08 ml/m  AORTIC VALVE AV Area (Vmax):    2.41 cm AV Area (Vmean):   2.18 cm AV Area (VTI):     2.05 cm AV Vmax:           168.00 cm/s AV Vmean:          109.000 cm/s AV VTI:            0.474 m AV Peak Grad:      11.3 mmHg AV Mean Grad:      6.0 mmHg LVOT Vmax:         129.00 cm/s LVOT Vmean:        75.800 cm/s LVOT VTI:          0.310 m LVOT/AV VTI ratio: 0.65  AORTA Ao Root diam: 2.60 cm Ao Asc diam:  3.00 cm MITRAL VALVE                TRICUSPID VALVE MV Area (PHT): 2.93 cm     TR Peak grad:   30.5 mmHg MV Decel Time: 259 msec     TR Vmax:        276.00 cm/s MV E velocity: 101.00 cm/s MV A velocity: 55.20 cm/s   SHUNTS MV E/A  ratio:  1.83         Systemic VTI:  0.31 m                             Systemic Diam: 2.00 cm Jenkins Rouge MD Electronically signed by Jenkins Rouge MD Signature Date/Time: 03/20/2022/12:02:52 PM    Final    [place] MFM OB FOLLOW UP  Result Date: 03/08/2022 ----------------------------------------------------------------------  OBSTETRICS REPORT                       (Signed Final 03/08/2022 08:39 am) ---------------------------------------------------------------------- Patient Info  ID #:       [id number]                          D.O.B.:  June 14, 1981 (41 yrs)  Name:       Sherlynn Stalls Matar                  Visit Date: 03/08/2022 08:23 am ---------------------------------------------------------------------- Performed By  Attending:        Johnell Comings MD         Ref. Address:     [address]                                                             [place], [place]  Performed By:     Benson Norway          [facility]                                                             [place] [facility]                                                             [facility]  Referred By:      [facility]                    for Women ---------------------------------------------------------------------- Orders  #  Description                           Code        Ordered By  1  [place] MFM OB FOLLOW UP                   [phone]    Sander Nephew  2  [place] MFM FETAL BPP WO NON               76819.01    [facility]     STRESS                                            BOOKER ----------------------------------------------------------------------  #  Order #                     Accession #                Episode #  1  [id number]                   [id number]                 [id number]  2  [id number]                   [id number]                  [id number] ---------------------------------------------------------------------- Indications  Obesity complicating pregnancy, third          O99.213  trimester BMI 20  Advanced maternal age multigravida 39+,        O69.523  third trimester (39)  [redacted] weeks gestation of pregnancy  Z3A.36  Previous cesarean delivery, antepartum         O34.219  (VBAC x 4)  Neg AFP ---------------------------------------------------------------------- Fetal Evaluation  Num Of Fetuses:         1  Fetal Heart Rate(bpm):  145  Cardiac Activity:       Observed  Presentation:           Cephalic  Placenta:               Posterior  P. Cord Insertion:      Previously Visualized  Amniotic Fluid  AFI FV:      Within normal limits  AFI Sum(cm)     %Tile       Largest Pocket(cm)  10              22          3.1  RUQ(cm)       RLQ(cm)       LUQ(cm)        LLQ(cm)  1.6           3             2.3            3.1 ---------------------------------------------------------------------- Biometry  BPD:      85.4  mm     G. Age:  34w 3d         14  %    CI:        70.56   %    70 - 86                                                          FL/HC:      21.9   %    20.1 - 22.1  HC:      324.1  mm     G. Age:  36w 5d         32  %    HC/AC:      0.95        0.93 - 1.11  AC:      340.3  mm     G. Age:  38w 0d         95  %    FL/BPD:     83.0   %    71 - 87  FL:       70.9  mm     G. Age:  36w 2d         51  %    FL/AC:      20.8   %    20 - 24  Est. FW:    3087  gm    6 lb 13 oz      75  % ---------------------------------------------------------------------- OB History  Gravidity:    6         Term:   5        Prem:   0        SAB:   0  TOP:          0       Ectopic:  0        Living: 5 ---------------------------------------------------------------------- Gestational Age  LMP:           35w  5d        Date:  07/01/21                 EDD:   04/07/22  U/S Today:     36w 3d                                        EDD:   04/02/22  Best:           36w 1d     Det. ByLoman Chroman         EDD:   04/04/22                                      (08/09/21) ---------------------------------------------------------------------- Anatomy  Cranium:               Appears normal         Aortic Arch:            Previously seen  Cavum:                 Previously seen        Ductal Arch:            Previously seen  Ventricles:            Previously seen        Diaphragm:              Previously seen  Choroid Plexus:        Previously seen        Stomach:                Appears normal, left                                                                        sided  Cerebellum:            Previously seen        Abdomen:                Appears normal  Posterior Fossa:       Previously seen        Abdominal Wall:         Previously seen  Nuchal Fold:           Previously seen        Cord Vessels:           Previously seen  Face:                  Orbits and profile     Kidneys:                Appear normal                         previously seen  Lips:                  Previously seen        Bladder:  Appears normal  Thoracic:              Appears normal         Spine:                  Previously seen  Heart:                 Previously seen        Upper Extremities:      Previously seen  RVOT:                  Previously seen        Lower Extremities:      Previously seen  LVOT:                  Previously seen  Other:  Heels/feet and open hand/5th digit, nasal bone, lenses, 3VV, 3VTV          and VC previously visualized. Female gender previously seen. ---------------------------------------------------------------------- Comments  This patient was seen for a BPP due to maternal obesity with  a BMI of 48.  She denies any problems since her last exam  and has screened negative for gestational diabetes.  The fetal growth and amniotic fluid level appears appropriate  for her gestational age.  A biophysical profile performed today was 8 out of 8.  The patient  is already scheduled for weekly fetal testing in  your office for the next 2 weeks.  No further exams were scheduled in our office. ----------------------------------------------------------------------                   Johnell Comings, MD Electronically Signed Final Report   03/08/2022 08:39 am ----------------------------------------------------------------------  US FETAL BPP W/NONSTRESS  Result Date: 03/18/2022 ----------------------------------------------------------------------  OBSTETRICS REPORT                        (Signed Final 03/18/2022 10:22 am) ---------------------------------------------------------------------- Patient Info  ID #:       [id number]                          D.O.B.:  09-12-1981 (41 yrs)  Name:       Sherlynn Stalls Fujiwara                  Visit Date: 02/24/2022 08:35 am ---------------------------------------------------------------------- Performed By  Attending:        Emeterio Reeve MD        Ref. Address:      [address]                                                              [place], [place]  Performed By:     Derinda Late RN      Location[facility]                                                              [facility] [facility]                                                              Women  Referred By:      [facility]                    for Women ---------------------------------------------------------------------- Orders  #  Description                           Code        Ordered By  1  US FETAL BPP W/NONSTRESS              [id number]     Emeterio Reeve ----------------------------------------------------------------------  #  Order #                     Accession #                Episode #  1  [id number]                   [id number]                 [id number]  ---------------------------------------------------------------------- Indications  Obesity complicating pregnancy, third           O99.213  trimester  [redacted] weeks gestation of pregnancy                 Z3A.34 ---------------------------------------------------------------------- Fetal Evaluation  Num Of Fetuses:          1  Preg. Location:          Intrauterine  Cardiac Activity:        Observed  Fetal Lie:               Maternal right side  Presentation:            Cephalic  Amniotic Fluid  AFI FV:      Within normal limits  AFI Sum(cm)     %Tile       Largest Pocket(cm)  10.09           20          4.76  RUQ(cm)       RLQ(cm)       LUQ(cm)        LLQ(cm)  0.35          4.76          2.76  2.22 ---------------------------------------------------------------------- Biophysical Evaluation  Amniotic F.V:   Pocket => 2 cm             F. Tone:         Observed  F. Movement:    Observed                   N.S.T:           Reactive  F. Breathing:   Observed                   Score:           10/10 ---------------------------------------------------------------------- OB History  Gravidity:    6         Term:   5        Prem:   0        SAB:   0  TOP:          0       Ectopic:  0        Living: 5 ---------------------------------------------------------------------- Gestational Age  LMP:           34w 0d        Date:  07/01/21                   EDD:   04/07/22  Best:          34w 3d     Det. ByLoman Chroman         EDD:   04/04/22                                      (08/09/21) ---------------------------------------------------------------------- Impression  NST is reactive. BPP 10/10. ---------------------------------------------------------------------- Recommendations  -Weekly antenatal testing from 32 weeks till delivery . ----------------------------------------------------------------------                  Emeterio Reeve, MD Electronically Signed Final Report   03/18/2022 10:22 am  ----------------------------------------------------------------------    Assessment/Plan Principal Problem:   Acute systolic heart failure ([place]) Active Problems:   BMI 50.0-59.9, adult ([place])   Postpartum cardiomyopathy   Postpartum hypertension  Acute systolic heart failure in the setting of postpartum. Low EF of 45 to 50% with wall motion abnormalities found.  Negative troponins. No evidence of PE No real evidence of preeclampsia though blood pressure is markedly elevated which can be seen in the setting of CHF.  Awaiting a cath protein to creatinine ratio but LFTs, platelets and serum creatinine appear normal. Would not add magnesium as this may worsen cardiac function. Patient is recently postpartum and is breast-feeding she is on meds that are compatible with this. Cardiology consult done and appreciated.  Continue Lasix and hydralazine.   Donnamae Jude 03/20/2022, 4:03 PM

## 2022-03-20 NOTE — Progress Notes (Signed)
  Echocardiogram 2D Echocardiogram has been performed.  Priscilla Delgado 03/20/2022, 11:57 AM

## 2022-03-20 NOTE — ED Notes (Signed)
Patient transported to CT 

## 2022-03-21 ENCOUNTER — Other Ambulatory Visit: Payer: Self-pay | Admitting: Obstetrics and Gynecology

## 2022-03-21 DIAGNOSIS — I5021 Acute systolic (congestive) heart failure: Secondary | ICD-10-CM | POA: Diagnosis not present

## 2022-03-21 DIAGNOSIS — O165 Unspecified maternal hypertension, complicating the puerperium: Secondary | ICD-10-CM

## 2022-03-21 LAB — BASIC METABOLIC PANEL
Anion gap: 11 (ref 5–15)
BUN: 14 mg/dL (ref 6–20)
CO2: 23 mmol/L (ref 22–32)
Calcium: 8.7 mg/dL — ABNORMAL LOW (ref 8.9–10.3)
Chloride: 106 mmol/L (ref 98–111)
Creatinine, Ser: 1.11 mg/dL — ABNORMAL HIGH (ref 0.44–1.00)
GFR, Estimated: >60 mL/min (ref >60)
Glucose, Bld: 88 mg/dL (ref 70–99)
Potassium: 4.1 mmol/L (ref 3.5–5.1)
Sodium: 140 mmol/L (ref 135–145)

## 2022-03-21 MED ORDER — LIDOCAINE 5 % EX PTCH
1.0000 | MEDICATED_PATCH | CUTANEOUS | Status: DC
  Administered 2022-03-21: 1 via TRANSDERMAL
  Filled 2022-03-21 (×2): qty 1

## 2022-03-21 NOTE — Progress Notes (Signed)
Post Partum Day 7 Subjective: Denies CP. Notes breathing much improved. Has some pain the middle of her back right over her spine. Is able to sleep without complaint despite the discomfort.  Had a HA but improved with tylenol.  Objective: Blood pressure 135/78, pulse (!) 52, temperature 98.4 F (36.9 C), temperature source Oral, resp. rate 18, height 5\' 5"  (1.651 m), weight 133.1 kg, last menstrual period 07/01/2021, SpO2 97 %, currently breastfeeding.  Physical Exam:  General: alert, cooperative, and no distress Lochia: appropriate CV: RRR Pulm: CTAB Uterine Fundus: firm  BMET    Component Value Date/Time   NA 140 03/21/2022 0440   NA 135 11/26/2019 1001   K 4.1 03/21/2022 0440   CL 106 03/21/2022 0440   CO2 23 03/21/2022 0440   GLUCOSE 88 03/21/2022 0440   BUN 14 03/21/2022 0440   BUN 5 (L) 11/26/2019 1001   CREATININE 1.11 (H) 03/21/2022 0440   CREATININE 0.82 12/15/2016 1110   CALCIUM 8.7 (L) 03/21/2022 0440   GFRNONAA >60 03/21/2022 0440     Assessment/Plan: Postpartum - MOF: Breast - No needs at this time - Heating pad for back pain, maybe a lidocaine patch  Pulm Edema/Acute Systolic HF - Appreciate Cardiology team input and recommendations - Bps well controlled on hyrdralazine 25Q8 - Had lasix x1 yesterday via IV - No evidence of PreE at this time: Slight bump in Cr - likely due to contrast for imaging yesterday.  P/C ratio normal.    LOS: 1 day   02-06-1972 03/21/2022, 7:10 AM

## 2022-03-21 NOTE — Progress Notes (Signed)
Progress Note  Patient Name: Priscilla Delgado: 03/22/2022  [facility] Cardiologist: None   Subjective   Blood pressure much better controlled in 110-120s this morning. Feels much better. Ready to go home.   Inpatient Medications    Scheduled Meds:  aspirin EC  81 mg Oral Daily   enoxaparin (LOVENOX) injection  70 mg Subcutaneous Q24H   hydrALAZINE  25 mg Oral Q8H   lidocaine  1 patch Transdermal Q24H   prenatal multivitamin  1 tablet Oral Daily   sodium chloride flush  3 mL Intravenous Q12H   Continuous Infusions:  sodium chloride     PRN Meds: sodium chloride, acetaminophen, ondansetron (ZOFRAN) IV, sodium chloride flush   Vital Signs    Vitals:   03/21/22 1948 03/21/22 2221 03/22/22 0350 03/22/22 0834  BP: 114/63 (!) 113/57 131/74 123/66  Pulse: 61 60 (!) 59 63  Resp: 18 19 16 18   Temp: 98.1 F (36.7 C) 98 F (36.7 C) 98.2 F (36.8 C) 98.1 F (36.7 C)  TempSrc: Oral Oral Oral Oral  SpO2: 99% 98% 99% 100%  Weight:   133.8 kg   Height:        Intake/Output Summary (Last 24 hours) at 03/22/2022 0847 Last data filed at 03/22/2022 0800 Gross per 24 hour  Intake 1040 ml  Output 2800 ml  Net -1760 ml      03/22/2022    3:50 AM 03/21/2022    3:49 AM 03/13/2022    3:37 AM  Last 3 Weights  Weight (lbs) 295 lb 293 lb 6.4 oz 305 lb 14.4 oz  Weight (kg) 133.811 kg 133.085 kg 138.755 kg      Telemetry    Sinus bradycardia - Personally Reviewed  ECG    No new tracing - Personally Reviewed  Physical Exam   GEN: No acute distress. Comfortable   Neck: No JVD Cardiac: RRR, no murmurs, rubs, or gallops.  Respiratory: Clear to auscultation bilaterally. GI: Soft, nontender, non-distended  MS: Trace edema, warm Neuro:  Nonfocal  Psych: Normal affect   Labs    High Sensitivity Troponin:   Recent Labs  Lab 03/20/22 1041 03/20/22 1246  TROPONINIHS 7 10     Chemistry Recent Labs  Lab 03/20/22 1041 03/20/22 1050 03/20/22 1246  03/21/22 0440 03/22/22 0410  NA 141 141  --  140 139  K 4.4 4.3  --  4.1 4.3  CL 113* 111  --  106 108  CO2 20*  --   --  23 24  GLUCOSE 83 80  --  88 77  BUN 13 13  --  14 15  CREATININE 0.90 0.80  --  1.11* 1.06*  CALCIUM 8.6*  --   --  8.7* 8.7*  PROT  --   --  5.8*  --   --   ALBUMIN  --   --  2.7*  --   --   AST  --   --  38  --   --   ALT  --   --  43  --   --   ALKPHOS  --   --  97  --   --   BILITOT  --   --  0.5  --   --   GFRNONAA >60  --   --  >60 >60  ANIONGAP 8  --   --  11 7    Lipids No results for input(s): CHOL, TRIG, HDL, LABVLDL, LDLCALC, CHOLHDL in  the last 168 hours.  Hematology Recent Labs  Lab 03/20/22 1041 03/20/22 1050  WBC 4.9  --   RBC 3.98  --   HGB 11.9* 11.9*  HCT 36.4 35.0*  MCV 91.5  --   MCH 29.9  --   MCHC 32.7  --   RDW 13.4  --   PLT 218  --    Thyroid No results for input(s): TSH, FREET4 in the last 168 hours.  BNP Recent Labs  Lab 03/20/22 1041  BNP 77.7    DDimer No results for input(s): DDIMER in the last 168 hours.   Radiology    CT Angio Chest PE W/Cm &/Or Wo Cm  Result Date: 03/20/2022 CLINICAL DATA:  High probability for acute pulmonary embolism. EXAM: CT ANGIOGRAPHY CHEST WITH CONTRAST TECHNIQUE: Multidetector CT imaging Delgado the chest was performed using the standard protocol during bolus administration Delgado intravenous contrast. Multiplanar CT image reconstructions and MIPs were obtained to evaluate the vascular anatomy. RADIATION DOSE REDUCTION: This exam was performed according to the departmental dose-optimization program which includes automated exposure control, adjustment Delgado the mA and/or kV according to patient size and/or use Delgado iterative reconstruction technique. CONTRAST:  OMNIPAQUE IOHEXOL 350 MG/ML SOLN COMPARISON:  None Available. FINDINGS: Cardiovascular: Diminished exam detail due to respiratory motion artifact and sub optimal pulmonary arterial opacification. Within this limitation there is no sign Delgado  lobar or segmental pulmonary artery filling defects. Mild cardiac enlargement without pericardial effusion. Mediastinum/Nodes: No enlarged mediastinal, hilar, or axillary lymph nodes. Thyroid gland, trachea, and esophagus demonstrate no significant findings. Lungs/Pleura: There are small bilateral pleural effusions. Multifocal bilateral areas Delgado ground-glass attenuation are seen within the left upper lobe and both lower lobes. No airspace consolidation or atelectasis. No signs Delgado pneumothorax. Upper Abdomen: No acute abnormality. Reflux Delgado contrast material is identified into the IVC and hepatic veins. Musculoskeletal: No chest wall abnormality. No acute or significant osseous findings. Review Delgado the MIP images confirms the above findings. IMPRESSION: 1. Diminished exam detail due to respiratory motion artifact and sub optimal pulmonary arterial opacification. Within this limitation there is no evidence for lobar or segmental pulmonary artery filling defects to suggest a clinically significant pulmonary embolus. 2. Small bilateral pleural effusions. 3. Multifocal bilateral areas Delgado ground-glass attenuation are noted within the left upper lobe and both lower lobes. Findings may reflect pulmonary edema or infection. 4. Reflux Delgado contrast material into the IVC and hepatic veins. Although nonspecific this may be seen in the setting Delgado right heart failure. Electronically Signed   By: Signa Kell M.D.   On: 03/20/2022 12:27   DG Chest Port 1 View  Result Date: 03/20/2022 CLINICAL DATA:  Short Delgado breath.  Postpartum. EXAM: PORTABLE CHEST 1 VIEW COMPARISON:  12/25/2020. FINDINGS: Cardiac silhouette normal in size.  No mediastinal or hilar masses. Hazy perihilar and lower lung zone opacities, new since the prior exam, accentuated by positioning and low lung volumes. No convincing pleural effusion. No pneumothorax. Skeletal structures are grossly intact. IMPRESSION: 1. New hazy perihilar and lower lung zone airspace  opacities suspected to be due to pulmonary edema. Electronically Signed   By: Amie Portland M.D.   On: 03/20/2022 11:12   ECHOCARDIOGRAM COMPLETE  Result Date: 03/20/2022    ECHOCARDIOGRAM REPORT   Patient Name:   Priscilla Delgado Exam: 03/20/2022 Medical Rec #:  [id number]      Height:       64.0 in Accession #:    [id number]  Weight:       305.9 lb Date Delgado Birth:  [birth year]       BSA:          2.344 m Patient Age:    41 years       BP:           170/85 mmHg Patient Gender: F              HR:           41 bpm. Exam Location:  Inpatient Procedure: 2D Echo, Cardiac Doppler and Color Doppler                       STAT ECHO Reported to: Dr Charlton Haws on 03/20/2022 11:56:00 AM. Indications:    Cardiomegaly  History:        Patient has no prior history Delgado Echocardiogram examinations.  Sonographer:    Ross Ludwig RDCS (AE) Referring Phys: [id number] LAURA A MURPHY  Sonographer Comments: Suboptimal subcostal window. Five days postpartum. IMPRESSIONS  1. Mid / basal inferior wall hypokinesis . Left ventricular ejection fraction, by estimation, is 45 to 50%. The left ventricle has mildly decreased function. The left ventricle has no regional wall motion abnormalities. The left ventricular internal cavity size was mildly dilated. Left ventricular diastolic parameters were normal.  2. Right ventricular systolic function is normal. The right ventricular size is normal.  3. Left atrial size was mildly dilated.  4. The pericardial effusion is posterior to the left ventricle.  5. The mitral valve is normal in structure. No evidence Delgado mitral valve regurgitation. No evidence Delgado mitral stenosis.  6. The aortic valve is tricuspid. Aortic valve regurgitation is not visualized. No aortic stenosis is present.  7. The inferior vena cava is normal in size with greater than 50% respiratory variability, suggesting right atrial pressure Delgado 3 mmHg. FINDINGS  Left Ventricle: Mid / basal inferior wall hypokinesis. Left ventricular  ejection fraction, by estimation, is 45 to 50%. The left ventricle has mildly decreased function. The left ventricle has no regional wall motion abnormalities. The left ventricular internal cavity size was mildly dilated. There is no left ventricular hypertrophy. Left ventricular diastolic parameters were normal. Right Ventricle: The right ventricular size is normal. No increase in right ventricular wall thickness. Right ventricular systolic function is normal. Left Atrium: Left atrial size was mildly dilated. Right Atrium: Right atrial size was normal in size. Pericardium: Trivial pericardial effusion is present. The pericardial effusion is posterior to the left ventricle. Mitral Valve: The mitral valve is normal in structure. No evidence Delgado mitral valve regurgitation. No evidence Delgado mitral valve stenosis. Tricuspid Valve: The tricuspid valve is normal in structure. Tricuspid valve regurgitation is mild . No evidence Delgado tricuspid stenosis. Aortic Valve: The aortic valve is tricuspid. Aortic valve regurgitation is not visualized. No aortic stenosis is present. Aortic valve mean gradient measures 6.0 mmHg. Aortic valve peak gradient measures 11.3 mmHg. Aortic valve area, by VTI measures 2.05  cm. Pulmonic Valve: The pulmonic valve was normal in structure. Pulmonic valve regurgitation is mild. No evidence Delgado pulmonic stenosis. Aorta: The aortic root is normal in size and structure. Venous: The inferior vena cava is normal in size with greater than 50% respiratory variability, suggesting right atrial pressure Delgado 3 mmHg. IAS/Shunts: No atrial level shunt detected by color flow Doppler.  LEFT VENTRICLE PLAX 2D LVIDd:         4.70 cm   Diastology LVIDs:  3.20 cm   LV e' medial:    7.72 cm/s LV PW:         1.10 cm   LV E/e' medial:  13.1 LV IVS:        1.00 cm   LV e' lateral:   12.20 cm/s LVOT diam:     2.00 cm   LV E/e' lateral: 8.3 LV SV:         97 LV SV Index:   42 LVOT Area:     3.14 cm  RIGHT VENTRICLE              IVC RV Basal diam:  3.50 cm     IVC diam: 2.00 cm RV S prime:     11.00 cm/s TAPSE (M-mode): 2.8 cm LEFT ATRIUM             Index        RIGHT ATRIUM           Index LA diam:        4.20 cm 1.79 cm/m   RA Area:     23.50 cm LA Vol (A2C):   66.2 ml 28.25 ml/m  RA Volume:   77.60 ml  33.11 ml/m LA Vol (A4C):   58.3 ml 24.88 ml/m LA Biplane Vol: 65.8 ml 28.08 ml/m  AORTIC VALVE AV Area (Vmax):    2.41 cm AV Area (Vmean):   2.18 cm AV Area (VTI):     2.05 cm AV Vmax:           168.00 cm/s AV Vmean:          109.000 cm/s AV VTI:            0.474 m AV Peak Grad:      11.3 mmHg AV Mean Grad:      6.0 mmHg LVOT Vmax:         129.00 cm/s LVOT Vmean:        75.800 cm/s LVOT VTI:          0.310 m LVOT/AV VTI ratio: 0.65  AORTA Ao Root diam: 2.60 cm Ao Asc diam:  3.00 cm MITRAL VALVE                TRICUSPID VALVE MV Area (PHT): 2.93 cm     TR Peak grad:   30.5 mmHg MV Decel Time: 259 msec     TR Vmax:        276.00 cm/s MV E velocity: 101.00 cm/s MV A velocity: 55.20 cm/s   SHUNTS MV E/A ratio:  1.83         Systemic VTI:  0.31 m                             Systemic Diam: 2.00 cm Charlton Haws MD Electronically signed by Charlton Haws MD Signature Date/Time: 03/20/2022/12:02:52 PM    Final     Cardiac Studies   TTE 03/20/22: IMPRESSIONS   1. Mid / basal inferior wall hypokinesis . Left ventricular ejection  fraction, by estimation, is 45 to 50%. The left ventricle has mildly  decreased function. The left ventricle has no regional wall motion  abnormalities. The left ventricular internal  cavity size was mildly dilated. Left ventricular diastolic parameters were  normal.   2. Right ventricular systolic function is normal. The right ventricular  size is normal.   3. Left atrial size was mildly dilated.   4. The  pericardial effusion is posterior to the left ventricle.   5. The mitral valve is normal in structure. No evidence Delgado mitral valve  regurgitation. No evidence Delgado mitral stenosis.   6. The  aortic valve is tricuspid. Aortic valve regurgitation is not  visualized. No aortic stenosis is present.   7. The inferior vena cava is normal in size with greater than 50%  respiratory variability, suggesting right atrial pressure Delgado 3 mmHg.   Patient Profile     41 y.o. female [id number] female who recently delivered on 03/14/22 at [redacted]w[redacted]d  is being seen 03/20/2022 for the evaluation Delgado acute systolic heart failure with mildly reduced EF.   Assessment & Plan    #Acute Systolic Heart Failure with Mildly Reduced LVEF: Patient presents with progressive SOB and orthopnea found to have pulmonary edema on CXR and mildly reduced LVEF 45-50% on TTE concerning for new onset congestive heart failure. 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 She does have mild WMA on TTE, however trop negative with lower suspicion for SCAD. Patient is currently breast feeding and wishes to continue if possible which is reasonable given her EF is only mildly reduced. She was started on lasix and hydralazine with good response. Appears euvolemic today with BP well controlled -Continue hydralazine 25mg  TID for now; will change to spironolactone +/- norvasc as outpatient pending blood pressures (will not switch today as discharge medications have already been sent to pharmacy) -Unable to tolerate BB due to bradycardia; can add in future if able -Will give lasix 20mg  PO daily prn as needed for weight gain, LE edema, SOB at home -Trop negative, low suspicion for ischemia/SCAD -Will plan for repeat TTE in 62months for reassessment Delgado LVEF -Discussed she is higher risk for subsequent pregnancies and patient states she does not wish to become pregnant again   #Severe Hypertension: SBP 170s on arrival. No prior history Delgado HTN or pre-eclampsia. Protein to Cr ratio normal. Responding well to  hydralazine. -Continue hydralazine 25mg  TID -Will change to spironolactone +/- norvasc as outpatient when she returns for follow-up  Plan discussed with patient and family. Will arrange for Cardio OB follow-up.    For questions or updates, please contact [facility] Please consult [email or web address] for contact info under        Signed, , MD  03/22/2022, 8:47 AM

## 2022-03-21 NOTE — Discharge Summary (Signed)
Physician Discharge Summary  Patient ID: Priscilla Delgado MRN: [id number] DOB/AGE: 39-Jun-1982 39 y.o.  Admit date: 03/20/2022 Discharge date: 03/22/2022  Admission Diagnoses: Principal Problem:   Acute systolic heart failure ([place]) Active Problems:   BMI 50.0-59.9, adult ([place])   Postpartum cardiomyopathy   Postpartum hypertension  Discharge Diagnoses:  Principal Problem:   Acute systolic heart failure ([facility]) Active Problems:   BMI 50.0-59.9, adult ([place])   Postpartum cardiomyopathy   Postpartum hypertension   Discharged Condition: good  Hospital Course: Priscilla Delgado presented on PPD6 to the emergency department with new onset shortness of breath who was ultimately diagnosed with acute systolic heart failure with echo showed EF of 45-50% (consistent with a postpartum cardiomyopathy). Her other work up was negative for PE and preeclampsia. Her blood pressures were initially elevated but this was felt to be related to her heart failure. She was started on hydralazine for afterload reduction and given lasix. She improved symptomatically and clinically as well. Her creatinine on HD2 increased but this was felt likely related to the contrast and other medications given. Recheck on HD3 was improved to 1.06. Given her improvement in symptoms and clinical findings, she was discharged home in good condition with close follow up coordinated by cardiology.   Consults: cardiology  Significant Diagnostic Studies: labs: BNP, troponin (serially negative), BMP, preeclampsia workup (negative) radiology: CT scan: negative for PE , and cardiac graphics: ECG: sinus bradycardia and possible LAE and Echocardiogram: EF 45-50% with mid/basal inferior wall hypokinesis  Treatments: IV hydration, cardiac meds: furosemide and hyralazine, and anticoagulation: LMW heparin  Discharge Exam: Blood pressure 131/74, pulse (!) 59, temperature 98.2 F (36.8 C), temperature source Oral, resp. rate 16, height 5\' 5"  (1.651 m), weight  133.8 kg, last menstrual period 07/01/2021, SpO2 99 %, currently breastfeeding. General appearance: alert, cooperative, and no distress Head: Normocephalic, without obvious abnormality, atraumatic Back: negative Resp: clear to auscultation bilaterally Cardio: regular rate and rhythm GI: soft, non-tender; bowel sounds normal; no masses,  no organomegaly Extremities: extremities normal, atraumatic, no cyanosis or edema Skin: Skin color, texture, turgor normal. No rashes or lesions  Disposition:  Discharge disposition: 01-Home or Self Care      Discharge Instructions     Call MD for:  difficulty breathing, headache or visual disturbances   Complete by: As directed    Call MD for:  persistant nausea and vomiting   Complete by: As directed    Call MD for:  redness, tenderness, or signs of infection (pain, swelling, redness, odor or green/yellow discharge around incision site)   Complete by: As directed    Call MD for:  severe uncontrolled pain   Complete by: As directed    Call MD for:  temperature >100.4   Complete by: As directed    Diet - low sodium heart healthy   Complete by: As directed    Driving Restrictions   Complete by: As directed    When not taking narcotic pain medication and would not hesitate to use the breaks, usually about 7 days   Increase activity slowly   Complete by: As directed    May shower / Bathe   Complete by: As directed    Sexual Activity Restrictions   Complete by: As directed    No sexual activity until cleared      Allergies as of 03/22/2022   No Known Allergies      Medication List     STOP taking these medications    cyclobenzaprine 10 MG tablet Commonly  known as: FLEXERIL   senna-docusate 8.6-50 MG tablet Commonly known as: Senokot-S       TAKE these medications    acetaminophen 500 MG tablet Commonly known as: TYLENOL Take 500 mg by mouth every 6 (six) hours as needed for mild pain.   aspirin EC 81 MG tablet Take 1  tablet (81 mg total) by mouth daily. Swallow whole.   furosemide 20 MG tablet Commonly known as: Lasix Take 1 tablet (20 mg total) by mouth daily.   hydrALAZINE 25 MG tablet Commonly known as: APRESOLINE Take 1 tablet (25 mg total) by mouth every 8 (eight) hours.   ibuprofen 600 MG tablet Commonly known as: ADVIL Take 1 tablet (600 mg total) by mouth every 6 (six) hours. What changed:  when to take this reasons to take this   Prenatal Complete 14-0.4 MG Tabs Take 1 tablet by mouth daily.        Follow-up Information     Freada Bergeron, MD Follow up in 2 week(s).   Specialties: Cardiology, Radiology Why: Dr. Johney Frame will arrange the office to contact you for a follow up appointment in two weeks. The most likely location will be the [facility] on [address]. Contact information: [id number] [address] [place] [facility] Follow up on 04/23/2022.   Specialty: Obstetrics and Gynecology Why: You have an appointment scheduled for this day for your postpartum check. Contact information: [place] [phone]                Signed: Radene Gunning 03/22/2022, 8:05 AM

## 2022-03-22 DIAGNOSIS — I5021 Acute systolic (congestive) heart failure: Secondary | ICD-10-CM | POA: Diagnosis not present

## 2022-03-22 LAB — BASIC METABOLIC PANEL
Anion gap: 7 (ref 5–15)
BUN: 15 mg/dL (ref 6–20)
CO2: 24 mmol/L (ref 22–32)
Calcium: 8.7 mg/dL — ABNORMAL LOW (ref 8.9–10.3)
Chloride: 108 mmol/L (ref 98–111)
Creatinine, Ser: 1.06 mg/dL — ABNORMAL HIGH (ref 0.44–1.00)
GFR, Estimated: >60 mL/min (ref >60)
Glucose, Bld: 77 mg/dL (ref 70–99)
Potassium: 4.3 mmol/L (ref 3.5–5.1)
Sodium: 139 mmol/L (ref 135–145)

## 2022-03-22 MED ORDER — HYDRALAZINE HCL 25 MG PO TABS: 25.0000 mg | ORAL_TABLET | Freq: Three times a day (TID) | ORAL | 1 refills | Status: DC

## 2022-03-22 MED ORDER — ASPIRIN 81 MG PO TBEC: 81.0000 mg | DELAYED_RELEASE_TABLET | Freq: Every day | ORAL | 12 refills | Status: DC

## 2022-03-22 MED ORDER — FUROSEMIDE 20 MG PO TABS: 20.0000 mg | ORAL_TABLET | Freq: Every day | ORAL | 0 refills | Status: DC

## 2022-03-23 ENCOUNTER — Telehealth: Payer: Self-pay | Admitting: *Deleted

## 2022-03-23 ENCOUNTER — Telehealth (HOSPITAL_COMMUNITY): Payer: Self-pay | Admitting: *Deleted

## 2022-03-23 NOTE — Telephone Encounter (Signed)
-----   Message from [employer], [place] sent at 03/22/2022  8:49 AM EDT ----- This patient needs hospital follow-up with Dr. Shari Prows on cardio OB clinic next Friday.  Approval given by Dr. Shari Prows.

## 2022-03-23 NOTE — Telephone Encounter (Signed)
             03/23/22 9:00 AM Called to schedule OB Clinic appointment per staff message/unable to LVM, not set up ket

## 2022-03-23 NOTE — Telephone Encounter (Signed)
RN was unable to get an interpreter through the [employer]. No call placed. Will attempt to contact patient again at a later time. Deforest Hoyles, RN, 03/23/22, [phone]

## 2022-03-24 NOTE — Telephone Encounter (Signed)
Attempted to call the pt again to arrange Cardio OB Clinic appt with Dr. Shari Prows next Friday 6/9, and pt did not answer and currently has no voicemail set-up.

## 2022-03-25 ENCOUNTER — Other Ambulatory Visit: Payer: Self-pay

## 2022-03-25 ENCOUNTER — Telehealth (HOSPITAL_COMMUNITY): Payer: Self-pay | Admitting: *Deleted

## 2022-03-25 NOTE — Telephone Encounter (Signed)
Voicemail not setup. Unable to leave message.  Duffy Rhody, RN 03-25-2022 at 12:52pm

## 2022-03-25 NOTE — Telephone Encounter (Signed)
Attempted to reach out to the pt again to arrange appt at our [place] [facility], and she did not answer and voicemail still isn't set-up.  Looks like several different MD offices have been trying to reach out to the pt to arrange post-hospital follow-up appts, and have also been unsuccessful.

## 2022-03-26 ENCOUNTER — Encounter: Payer: Self-pay | Admitting: Radiology

## 2022-03-26 ENCOUNTER — Ambulatory Visit: Payer: No Typology Code available for payment source | Admitting: Cardiology

## 2022-04-23 ENCOUNTER — Encounter: Payer: Self-pay | Admitting: Radiology

## 2022-04-23 ENCOUNTER — Ambulatory Visit (INDEPENDENT_AMBULATORY_CARE_PROVIDER_SITE_OTHER): Payer: Medicaid Other | Admitting: Medical

## 2022-04-23 ENCOUNTER — Other Ambulatory Visit: Payer: Self-pay

## 2022-04-23 VITALS — BP 117/75 | HR 62 | Wt 299.8 lb

## 2022-04-23 DIAGNOSIS — Z98891 History of uterine scar from previous surgery: Secondary | ICD-10-CM

## 2022-04-23 DIAGNOSIS — Z3009 Encounter for other general counseling and advice on contraception: Secondary | ICD-10-CM

## 2022-04-23 DIAGNOSIS — K5901 Slow transit constipation: Secondary | ICD-10-CM

## 2022-04-23 DIAGNOSIS — O165 Unspecified maternal hypertension, complicating the puerperium: Secondary | ICD-10-CM

## 2022-04-23 DIAGNOSIS — O903 Peripartum cardiomyopathy: Secondary | ICD-10-CM

## 2022-04-23 DIAGNOSIS — I5021 Acute systolic (congestive) heart failure: Secondary | ICD-10-CM

## 2022-04-23 DIAGNOSIS — R4589 Other symptoms and signs involving emotional state: Secondary | ICD-10-CM

## 2022-04-23 MED ORDER — SENNA 8.6 MG PO TABS: 1.0000 | ORAL_TABLET | Freq: Every day | ORAL | 0 refills | Status: DC | PRN

## 2022-04-23 NOTE — Progress Notes (Signed)
Post Partum Visit Note  Priscilla Delgado is a 41 y.o. [phone] female who presents for a postpartum visit. She is 5 weeks postpartum following a normal spontaneous vaginal delivery.  I have fully reviewed the prenatal and intrapartum course. The delivery was at 37 gestational weeks.  Anesthesia: epidural. Postpartum course has been complicated by readmission for PP HTN, PP cardiomyopathy and heart failure. Baby is doing well. Baby is feeding by both breast and bottle - [place] . Bleeding no bleeding. Bowel function is normal. Bladder function is abnormal: constipation . Patient is not sexually active. Contraception method is abstinence. Postpartum depression screening: negative.   The pregnancy intention screening data noted above was reviewed. Potential methods of contraception were discussed. The patient elected to proceed with No data recorded.   Edinburgh Postnatal Depression Scale - 04/23/22 1026       Edinburgh Postnatal Depression Scale:  In the Past 7 Days   I have been able to laugh and see the funny side of things. 0    I have looked forward with enjoyment to things. 0    I have blamed myself unnecessarily when things went wrong. 0    I have been anxious or worried for no good reason. 0    I have felt scared or panicky for no good reason. 0    Things have been getting on top of me. 0    I have been so unhappy that I have had difficulty sleeping. 0    I have felt sad or miserable. 2    I have been so unhappy that I have been crying. 1    The thought of harming myself has occurred to me. 0    Edinburgh Postnatal Depression Scale Total 3             Health Maintenance Due  Topic Date Due   COVID-19 Vaccine (2 - Booster for Janssen series) 06/03/2020   PAP SMEAR-Modifier  11/27/2021    The following portions of the patient's history were reviewed and updated as appropriate: allergies, current medications, past family history, past medical history, past social history,  past surgical history, and problem list.  Review of Systems Pertinent items are noted in HPI.  Objective:  BP 117/75   Pulse 62   Wt 299 lb 12.8 oz (136 kg)   LMP 07/01/2021 (Approximate)   BMI 49.89 kg/m    General:  alert and cooperative   Breasts:  not indicated  Lungs: clear to auscultation bilaterally  Heart:  regular rate and rhythm, S1, S2 normal, no murmur, click, rub or gallop  Abdomen: Soft, non-tender    Wound N/A  GU exam:  not indicated       Assessment:   Normal postpartum exam.  Encounter Diagnoses  Name Primary?   Feeling of sadness Yes   Routine postpartum follow-up    Postpartum hypertension    Postpartum cardiomyopathy    History of VBAC    Acute systolic heart failure (HCC)    Unwanted fertility    Slow transit constipation      Plan:   Essential components of care per ACOG recommendations:  1.  Mood and well being: Patient with negative depression screening today. Reviewed local resources for support.  - Patient tobacco use? No.   - hx of drug use? No.    2. Infant care and feeding:  -Patient currently breastmilk feeding? Yes. Discussed returning to work and pumping.  -Social determinants of health (SDOH) reviewed in  EPIC. No concerns  3. Sexuality, contraception and birth spacing - Patient does not want a pregnancy in the next year.  Desired family size is 6 children.  - Reviewed reproductive life planning. Reviewed contraceptive methods based on pt preferences and effectiveness.  Patient desired Abstinence for now.   4. Sleep and fatigue -Encouraged family/partner/community support of 4 hrs of uninterrupted sleep to help with mood and fatigue  5. Physical Recovery  - Discussed patients delivery and complications. She describes her labor as bad. More painful that other deliveries - Patient had a Vaginal, no problems at delivery. Patient had  no  laceration. Perineal healing reviewed. Patient expressed understanding - Patient has urinary  incontinence? No. - Patient is not safe to resume physical and sexual activity  6.  Health Maintenance - HM due items addressed Yes - Last pap smear  Diagnosis  Date Value Ref Range Status  11/27/2018   Final   NEGATIVE FOR INTRAEPITHELIAL LESIONS OR MALIGNANCY.  11/27/2018   Final   FUNGAL ORGANISMS PRESENT CONSISTENT WITH CANDIDA SPP.   Pap smear not done at today's visit.  -Breast Cancer screening indicated? No.   7. Chronic Disease/Pregnancy Condition follow up: Hypertension - Already referred to cardiology   Vonzella Nipple, PA-C [facility], [facility]

## 2022-05-07 ENCOUNTER — Encounter: Payer: Self-pay | Admitting: Radiology

## 2022-05-07 ENCOUNTER — Telehealth: Payer: Self-pay | Admitting: Clinical

## 2022-05-07 NOTE — Telephone Encounter (Signed)
Called per referral; scheduled appointment.

## 2022-05-10 NOTE — BH Specialist Note (Signed)
Pt did not arrive to video visit and did not answer the phone, per [facility] interpreter [place], [phone]; Unable to leave message as voicemail has not been set up.

## 2022-05-18 ENCOUNTER — Ambulatory Visit: Payer: No Typology Code available for payment source | Admitting: Clinical

## 2022-05-18 DIAGNOSIS — Z91199 Patient's noncompliance with other medical treatment and regimen due to unspecified reason: Secondary | ICD-10-CM

## 2022-05-28 ENCOUNTER — Encounter: Payer: Self-pay | Admitting: Cardiology

## 2022-05-28 ENCOUNTER — Ambulatory Visit (INDEPENDENT_AMBULATORY_CARE_PROVIDER_SITE_OTHER): Payer: Medicaid Other | Admitting: Cardiology

## 2022-05-28 ENCOUNTER — Ambulatory Visit (INDEPENDENT_AMBULATORY_CARE_PROVIDER_SITE_OTHER): Payer: Medicaid Other

## 2022-05-28 VITALS — BP 132/86 | HR 71 | Ht 63.0 in | Wt 303.2 lb

## 2022-05-28 DIAGNOSIS — O903 Peripartum cardiomyopathy: Secondary | ICD-10-CM

## 2022-05-28 DIAGNOSIS — R002 Palpitations: Secondary | ICD-10-CM

## 2022-05-28 DIAGNOSIS — Z6841 Body Mass Index (BMI) 40.0 and over, adult: Secondary | ICD-10-CM

## 2022-05-28 DIAGNOSIS — R634 Abnormal weight loss: Secondary | ICD-10-CM

## 2022-05-28 NOTE — Patient Instructions (Signed)
Medication Instructions:  Your physician recommends that you continue on your current medications as directed. Please refer to the Current Medication list given to you today.  *If you need a refill on your cardiac medications before your next appointment, please call your pharmacy*   Lab Work: None If you have labs (blood work) drawn today and your tests are completely normal, you will receive your results only by: MyChart Message (if you have MyChart) OR A paper copy in the mail If you have any lab test that is abnormal or we need to change your treatment, we will call you to review the results.   Testing/Procedures: Your physician has requested that you have an echocardiogram. Echocardiography is a painless test that uses sound waves to create images of your heart. It provides your doctor with information about the size and shape of your heart and how well your heart's chambers and valves are working. This procedure takes approximately one hour. There are no restrictions for this procedure.  A zio monitor was ordered today. It will remain on for 14 days. You will then return monitor and event diary in provided box. It takes 1-2 weeks for report to be downloaded and returned to [place]. We will call you with the results. If monitor falls off or has orange flashing light, please call Zio for further instructions.     Follow-Up: At [facility], you and your health needs are our priority.  As part of our continuing mission to provide you with exceptional heart care, we have created designated Provider Care Teams.  These Care Teams include your primary Cardiologist (physician) and Advanced Practice Providers (APPs -  Physician Assistants and Nurse Practitioners) who all work together to provide you with the care you need, when you need it.  We recommend signing up for the patient portal called "MyChart".  Sign up information is provided on this After Visit Summary.  MyChart is used to connect with  patients for Virtual Visits (Telemedicine).  Patients are able to view lab/test results, encounter notes, upcoming appointments, etc.  Non-urgent messages can be sent to your provider as well.   To learn more about what you can do with MyChart, go to [email or web address].    Your next appointment:   3 month(s)  The format for your next appointment:   In Person  Provider:   Thomasene Ripple, DO [address], [place], [place] [address]    Other Instructions   Important Information About Sugar

## 2022-05-28 NOTE — Progress Notes (Signed)
.  h 

## 2022-05-28 NOTE — Progress Notes (Signed)
Cardio-Obstetrics Clinic  New Evaluation  Date:  06/01/2022   ID:  Priscilla Delgado, DOB 24-Feb-1981, MRN [id number]  PCP:  Mike Gip, FNP (Inactive)   [facility] Providers Cardiologist:  None  Electrophysiologist:  None       Referring MD: Marny Lowenstein, PA-C   Chief Complaint: 'I am ok"  History of Present Illness:    Priscilla Delgado is a 41 y.o. female [[id number]] who is being seen today for the evaluation of postpartum hypertension and heart failure at the request of Marny Lowenstein, PA-C.   Medical history includes postpartum cardiomyopathy recent EF 45 to 50%, hypertension, morbid obesity, here today to establish care in the cardio-obstetrics  clinic.  She was recently admitted at [facility] for at postpartum day 6 after she presented with acute shortness of breath and was noted to have depressed ejection fraction which was consistent with postpartum cardiomyopathy.  She was treated appropriately and discharged home on medications which included hydralazine 25 mg every 8 hours, furosemide 1 daily.  Posthospitalization she became better and then completed her medication however has not been taking any medications.  After hospitalization she was initially set up to see Dr. Shari Prows 2 weeks after her discharge fortunately she did not make this appointment.  She is here today.  She reports that the only complaints that she had is the fact that she has been experiencing worsening palpitations.  Prior CV Studies Reviewed: The following studies were reviewed today:  Transthoracic echocardiogram Mar 20, 2022 IMPRESSIONS:  1. Mid / basal inferior wall hypokinesis . Left ventricular ejection  fraction, by estimation, is 45 to 50%. The left ventricle has mildly  decreased function. The left ventricle has no regional wall motion  abnormalities. The left ventricular internal  cavity size was mildly dilated. Left ventricular diastolic parameters were  normal.   2. Right  ventricular systolic function is normal. The right ventricular  size is normal.   3. Left atrial size was mildly dilated.   4. The pericardial effusion is posterior to the left ventricle.   5. The mitral valve is normal in structure. No evidence of mitral valve  regurgitation. No evidence of mitral stenosis.   6. The aortic valve is tricuspid. Aortic valve regurgitation is not  visualized. No aortic stenosis is present.   7. The inferior vena cava is normal in size with greater than 50%  respiratory variability, suggesting right atrial pressure of 3 mmHg.   FINDINGS   Left Ventricle: Mid / basal inferior wall hypokinesis. Left ventricular  ejection fraction, by estimation, is 45 to 50%. The left ventricle has  mildly decreased function. The left ventricle has no regional wall motion  abnormalities. The left ventricular  internal cavity size was mildly dilated. There is no left ventricular  hypertrophy. Left ventricular diastolic parameters were normal.   Right Ventricle: The right ventricular size is normal. No increase in  right ventricular wall thickness. Right ventricular systolic function is  normal.   Left Atrium: Left atrial size was mildly dilated.   Right Atrium: Right atrial size was normal in size.   Pericardium: Trivial pericardial effusion is present. The pericardial  effusion is posterior to the left ventricle.   Mitral Valve: The mitral valve is normal in structure. No evidence of  mitral valve regurgitation. No evidence of mitral valve stenosis.   Tricuspid Valve: The tricuspid valve is normal in structure. Tricuspid  valve regurgitation is mild . No evidence of tricuspid stenosis.   Aortic  Valve: The aortic valve is tricuspid. Aortic valve regurgitation is  not visualized. No aortic stenosis is present. Aortic valve mean gradient  measures 6.0 mmHg. Aortic valve peak gradient measures 11.3 mmHg. Aortic  valve area, by VTI measures 2.05   cm.   Pulmonic  Valve: The pulmonic valve was normal in structure. Pulmonic valve  regurgitation is mild. No evidence of pulmonic stenosis.   Aorta: The aortic root is normal in size and structure.   Venous: The inferior vena cava is normal in size with greater than 50%  respiratory variability, suggesting right atrial pressure of 3 mmHg.   IAS/Shunts: No atrial level shunt detected by color flow Doppler.   Past Medical History:  Diagnosis Date   Ulcer of the stomach and intestine     Past Surgical History:  Procedure Laterality Date   CESAREAN SECTION        OB History     Gravida  6   Para  6   Term  6   Preterm  0   AB  0   Living  6      SAB  0   IAB  0   Ectopic  0   Multiple  0   Live Births  6        Obstetric Comments  C/s for breech- leg was coming down             Current Medications: Current Meds  Medication Sig   acetaminophen (TYLENOL) 500 MG tablet Take 500 mg by mouth every 6 (six) hours as needed for mild pain.     Allergies:   Patient has no known allergies.   Social History   Socioeconomic History   Marital status: Single    Spouse name: Not on file   Number of children: Not on file   Years of education: Not on file   Highest education level: Not on file  Occupational History   Not on file  Tobacco Use   Smoking status: Never   Smokeless tobacco: Never  Vaping Use   Vaping Use: Never used  Substance and Sexual Activity   Alcohol use: Yes    Alcohol/week: 1.0 standard drink of alcohol    Types: 1 Glasses of wine per week    Comment: sometimes on weekends   Drug use: Never   Sexual activity: Yes    Birth control/protection: None  Other Topics Concern   Not on file  Social History Narrative   ** Merged History Encounter **       Social Determinants of Health   Financial Resource Strain: Not on file  Food Insecurity: No Food Insecurity (11/26/2021)   Hunger Vital Sign    Worried About Running Out of Food in the Last Year:  Never true    Ran Out of Food in the Last Year: Never true  Transportation Needs: No Transportation Needs (11/26/2021)   PRAPARE - Administrator, Civil Service (Medical): No    Lack of Transportation (Non-Medical): No  Physical Activity: Not on file  Stress: Not on file  Social Connections: Not on file      Family History  Problem Relation Age of Onset   ADD / ADHD Neg Hx    Diabetes Neg Hx    Hypertension Neg Hx       ROS:   Please see the history of present illness.     All other systems reviewed and are negative.   Labs/EKG Reviewed:  EKG:   EKG is was not ordered today.   Recent Labs: 03/20/2022: ALT 43; B Natriuretic Peptide 77.7; Hemoglobin 11.9; Platelets 218 03/22/2022: BUN 15; Creatinine, Ser 1.06; Potassium 4.3; Sodium 139   Recent Lipid Panel Lab Results  Component Value Date/Time   CHOL 152 12/15/2016 11:10 AM   TRIG 56 12/15/2016 11:10 AM   HDL 50 (L) 12/15/2016 11:10 AM   CHOLHDL 3.0 12/15/2016 11:10 AM   LDLCALC 91 12/15/2016 11:10 AM    Physical Exam:    VS:  BP 132/86   Pulse 71   Ht 5\' 3"  (1.6 m)   Wt (!) 303 lb 3.2 oz (137.5 kg)   SpO2 97%   BMI 53.71 kg/m     Wt Readings from Last 3 Encounters:  05/28/22 (!) 303 lb 3.2 oz (137.5 kg)  04/23/22 299 lb 12.8 oz (136 kg)  03/22/22 295 lb (133.8 kg)     GEN:  Well nourished, well developed in no acute distress HEENT: Normal NECK: No JVD; No carotid bruits LYMPHATICS: No lymphadenopathy CARDIAC: RRR, no murmurs, rubs, gallops RESPIRATORY:  Clear to auscultation without rales, wheezing or rhonchi  ABDOMEN: Soft, non-tender, non-distended MUSCULOSKELETAL:  No edema; No deformity  SKIN: Warm and dry NEUROLOGIC:  Alert and oriented x 3 PSYCHIATRIC:  Normal affect    Risk Assessment/Risk Calculators:                 ASSESSMENT & PLAN:    Postpartum cardiomyopathy-she has not been taking discharge medications.  She is breast-feeding.  She tells me she has been  concerned about this.  I would like to start the patient back on medications to help protect her but at this time she is very hesitant.  So what I like to do is repeat an echocardiogram to assess her LV function and then hopefully we can do further discussions from there.  In terms of her palpitations we will place a monitor on the patient to make sure that she is not experiencing any significant arrhythmia.  The patient understands the need to lose weight with diet and exercise. We have discussed specific strategies for this.  Will also refer the patient to our pharmacy weight loss clinic.  Patient Instructions  Medication Instructions:  Your physician recommends that you continue on your current medications as directed. Please refer to the Current Medication list given to you today.  *If you need a refill on your cardiac medications before your next appointment, please call your pharmacy*   Lab Work: None If you have labs (blood work) drawn today and your tests are completely normal, you will receive your results only by: MyChart Message (if you have MyChart) OR A paper copy in the mail If you have any lab test that is abnormal or we need to change your treatment, we will call you to review the results.   Testing/Procedures: Your physician has requested that you have an echocardiogram. Echocardiography is a painless test that uses sound waves to create images of your heart. It provides your doctor with information about the size and shape of your heart and how well your heart's chambers and valves are working. This procedure takes approximately one hour. There are no restrictions for this procedure.  A zio monitor was ordered today. It will remain on for 14 days. You will then return monitor and event diary in provided box. It takes 1-2 weeks for report to be downloaded and returned to 03/24/22. We will call you with the  results. If monitor falls off or has orange flashing light, please call Zio  for further instructions.     Follow-Up: At [facility], you and your health needs are our priority.  As part of our continuing mission to provide you with exceptional heart care, we have created designated Provider Care Teams.  These Care Teams include your primary Cardiologist (physician) and Advanced Practice Providers (APPs -  Physician Assistants and Nurse Practitioners) who all work together to provide you with the care you need, when you need it.  We recommend signing up for the patient portal called "MyChart".  Sign up information is provided on this After Visit Summary.  MyChart is used to connect with patients for Virtual Visits (Telemedicine).  Patients are able to view lab/test results, encounter notes, upcoming appointments, etc.  Non-urgent messages can be sent to your provider as well.   To learn more about what you can do with MyChart, go to [email or web address].    Your next appointment:   3 month(s)  The format for your next appointment:   In Person  Provider:   Thomasene Ripple, DO [address], [place], [place] [address]    Other Instructions   Important Information About Sugar         Dispo:  No follow-ups on file.   Medication Adjustments/Labs and Tests Ordered: Current medicines are reviewed at length with the patient today.  Concerns regarding medicines are outlined above.  Tests Ordered: Orders Placed This Encounter  Procedures   AMB Referral to [facility] Pharm-D   LONG TERM MONITOR (3-14 DAYS)   ECHOCARDIOGRAM COMPLETE   Medication Changes: No orders of the defined types were placed in this encounter.

## 2022-06-02 ENCOUNTER — Encounter (INDEPENDENT_AMBULATORY_CARE_PROVIDER_SITE_OTHER): Payer: Self-pay

## 2022-06-16 ENCOUNTER — Ambulatory Visit (HOSPITAL_COMMUNITY): Payer: Medicaid Other | Attending: Cardiovascular Disease

## 2022-06-16 DIAGNOSIS — I361 Nonrheumatic tricuspid (valve) insufficiency: Secondary | ICD-10-CM | POA: Diagnosis not present

## 2022-06-16 DIAGNOSIS — O903 Peripartum cardiomyopathy: Secondary | ICD-10-CM

## 2022-06-16 LAB — ECHOCARDIOGRAM COMPLETE
Area-P 1/2: 4.39 cm2
S' Lateral: 3.2 cm

## 2022-06-22 ENCOUNTER — Ambulatory Visit
Payer: Medicaid Other | Attending: Cardiovascular Disease | Admitting: Pharmacist Clinician (PhC)/ Clinical Pharmacy Specialist

## 2022-06-22 ENCOUNTER — Encounter: Payer: Self-pay | Admitting: Pharmacist Clinician (PhC)/ Clinical Pharmacy Specialist

## 2022-06-22 DIAGNOSIS — Z6841 Body Mass Index (BMI) 40.0 and over, adult: Secondary | ICD-10-CM | POA: Diagnosis not present

## 2022-06-22 MED ORDER — WEGOVY 0.25 MG/0.5ML ~~LOC~~ SOAJ: 0.2500 mg | SUBCUTANEOUS | 0 refills | Status: DC

## 2022-06-22 NOTE — Assessment & Plan Note (Signed)
Patient has not met goal of at least 5% of body weight loss with comprehensive lifestyle modifications alone in the past 3-6 months. Pharmacotherapy is appropriate to pursue as augmentation. Will start Wegovy.   Confirmed patient not pregnant and no personal or family history of medullary thyroid carcinoma (MTC) or Multiple Endocrine Neoplasia syndrome type 2 (MEN 2).  She states willingness to stop breast feeding immediately in order to begin weight loss regimen.  Reviewed side effects of medication and that these (and other issues) could transfer through breast milk and to the infant.  She clearly voiced understanding and agrees that if using medication will not breast feed.  Advised patient on common side effects including nausea, diarrhea, dyspepsia, decreased appetite, and fatigue. Counseled patient on reducing meal size and how to titrate medication to minimize side effects. Patient aware to call if intolerable side effects or if experiencing dehydration, abdominal pain, or dizziness. Patient will adhere to dietary modifications and will target at least 150 minutes of moderate intensity exercise weekly.   Titration Plan:  Will plan to follow the titration plan as below, pending patient is tolerating each dose before increasing to the next. Can slow titration if needed for tolerability.    -Month 1: Inject 0.25 mg SQ once weekly x 4 weeks -Month 2: Inject 0.5 mg SQ once weekly x 4 weeks -Month 3: Inject 1 mg SQ once weekly x 4 weeks -Month 4: Inject 1.7 mg SQ once weekly   If medication approved by insurance, will plan to see her back in 3 months for follow up.

## 2022-06-22 NOTE — Progress Notes (Signed)
HPI: Priscilla Delgado is a 41 y.o. female patient referred to pharmacy clinic by Dr. Harriet Masson to initiate weight loss therapy with GLP1-RA.  Most recent BMI 54.4.  Patient gave birth May 21 to her sixth child in uncomplicated delivery.  She was considered high risk due to advanced maternal age.  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  She was given hydralazine and furosemide on discharge, but stopped these after one month.  A repeat echocardiogram last week showed that her EF had returned to 55-60%.    Today she is in the office to discuss weight loss options.  She has been nursing only once daily, and notes that she doesn't produce enough milk for anything more than that.  Baby daughter is tolerating formula without issues.  Patient reports that she has tried intermittent fasting, weight watchers and exercise to help her lose weight, but has not been successful.  She notes she currently weighs more than at time of delivery, despite losing 18 pounds after the birth.  She is currently back to work - second shift, and has her lunch break around 9 pm  Current weight management medications: none  Previously tried meds: none  Current meds that may affect weight: none  Baseline weight/BMI:  139.25 kg/ BMI 54.4  Insurance payor: [employer]  Diet:  -Breakfast: none -Lunch: about 2 pm -Dinner: around 9 pm ; rice and vegetables; beans, chicpeas, potatoes - beef; some chicken or fish; vegetables are mix of fresh/frozen and canned -Snacks: -Drinks:water, tea, no soda  Exercise: walk around house, no regular exercise  Family History: no weight issues with siblings;   Confirmed patient not pregnant and no personal or family history of medullary thyroid carcinoma (MTC) or Multiple Endocrine Neoplasia syndrome type 2 (MEN 2).   Social History: no tobacco, occasional wine; no caffeine  Labs: Lab  Results  Component Value Date   HGBA1C 5.3 10/28/2021    Wt Readings from Last 1 Encounters:  06/22/22 (!) 307 lb (139.3 kg)    BP Readings from Last 1 Encounters:  06/22/22 113/73   Pulse Readings from Last 1 Encounters:  05/28/22 71       Component Value Date/Time   CHOL 152 12/15/2016 1110   TRIG 56 12/15/2016 1110   HDL 50 (L) 12/15/2016 1110   CHOLHDL 3.0 12/15/2016 1110   VLDL 11 12/15/2016 1110   Larose 91 12/15/2016 1110    Past Medical History:  Diagnosis Date   Ulcer of the stomach and intestine     Current Outpatient Medications on File Prior to Visit  Medication Sig Dispense Refill   acetaminophen (TYLENOL) 500 MG tablet Take 500 mg by mouth every 6 (six) hours as needed for mild pain.     No current facility-administered medications on file prior to visit.    No Known Allergies  BMI 50.0-59.9, adult ([place]) Patient has not met goal of at least 5% of body weight loss with comprehensive lifestyle modifications alone in the past 3-6 months. Pharmacotherapy is appropriate to pursue as augmentation. Will start Wegovy.   Confirmed patient not pregnant and no personal or family history of medullary thyroid carcinoma (MTC) or Multiple Endocrine Neoplasia syndrome type 2 (MEN 2).  She states willingness to stop breast feeding immediately in order to begin weight loss regimen.  Reviewed side effects of medication and that these (and other issues) could transfer through breast milk  and to the infant.  She clearly voiced understanding and agrees that if using medication will not breast feed.  Advised patient on common side effects including nausea, diarrhea, dyspepsia, decreased appetite, and fatigue. Counseled patient on reducing meal size and how to titrate medication to minimize side effects. Patient aware to call if intolerable side effects or if experiencing dehydration, abdominal pain, or dizziness. Patient will adhere to dietary modifications and will target at  least 150 minutes of moderate intensity exercise weekly.   Titration Plan:  Will plan to follow the titration plan as below, pending patient is tolerating each dose before increasing to the next. Can slow titration if needed for tolerability.    -Month 1: Inject 0.25 mg SQ once weekly x 4 weeks -Month 2: Inject 0.5 mg SQ once weekly x 4 weeks -Month 3: Inject 1 mg SQ once weekly x 4 weeks -Month 4: Inject 1.7 mg SQ once weekly   If medication approved by insurance, will plan to see her back in 3 months for follow up.     Tommy Medal PharmD CPP [place]

## 2022-06-22 NOTE — Patient Instructions (Signed)
We will start the prior authorization process to get Wegovy covered by your insurance.   TIPS FOR SUCCESS Write down the reasons why you want to lose weight and post it in a place where you'll see it often. Start small and work your way up. Keep in mind that it takes time to achieve goals, and small steps add up. Any additional movements help to burn calories. Taking the stairs rather than the elevator and parking at the far end of your parking lot are easy ways to start. Brisk walking for at least 30 minutes 4 or more days of the week is an excellent goal to work toward  [employer] WHAT IT MEANS TO FEEL FULL Did you know that it can take 15 minutes or more for your brain to receive the message that you've eaten? That means that, if you eat less food, but consume it slower, you may still feel satisfied. Eating a lot of fruits and vegetables can also help you feel fuller. Eat off of smaller plates so that moderate portions don't seem too small  TITRATION PLAN Will plan to follow the titration plan as below, pending patient is tolerating each dose before increasing to the next. Can slow titration if needed for tolerability.    -Weeks 1-4: Inject 0.25 mg SQ once weekly  -Weeks 5-8: Inject 0.5 mg SQ once weekly  -Weeks 9-12 Inject 1 mg SQ once weekly  -Weeks 13-16: Inject 1.7 mg SQ once weekly   Follow up in 3 months.  If you have any questions or concerns, please reach out to [place].  Lorretta Kerce/Chris at [phone].  THANK YOU FOR CHOOSING [facility]

## 2022-06-25 ENCOUNTER — Telehealth: Payer: Self-pay | Admitting: Cardiology

## 2022-06-25 NOTE — Telephone Encounter (Signed)
Pt calling back, informed her Belenda Cruise RPH-CPP is out of the office today. She states she will call back Tuesday

## 2022-06-25 NOTE — Telephone Encounter (Signed)
Patient called to follow up on if her medication application was approved.

## 2022-06-29 NOTE — Telephone Encounter (Signed)
PA for [facility] was denied. Not covered

## 2022-07-05 NOTE — Telephone Encounter (Signed)
Explained that med not covered on her insurance (Medicaid - BCBS managed).  Answered pt questions.

## 2022-08-30 ENCOUNTER — Ambulatory Visit: Payer: Medicaid Other | Admitting: Cardiology

## 2022-09-05 ENCOUNTER — Other Ambulatory Visit: Payer: Self-pay

## 2022-09-05 ENCOUNTER — Emergency Department (HOSPITAL_COMMUNITY)
Admission: EM | Admit: 2022-09-05 | Discharge: 2022-09-05 | Disposition: A | Discharge status: Home / Self Care | Payer: Medicaid Other | Attending: Emergency Medicine | Admitting: Emergency Medicine

## 2022-09-05 ENCOUNTER — Encounter (HOSPITAL_COMMUNITY): Payer: Self-pay | Admitting: Pharmacy Technician

## 2022-09-05 ENCOUNTER — Emergency Department (HOSPITAL_COMMUNITY): Payer: Medicaid Other

## 2022-09-05 DIAGNOSIS — R109 Unspecified abdominal pain: Secondary | ICD-10-CM | POA: Diagnosis not present

## 2022-09-05 DIAGNOSIS — R1031 Right lower quadrant pain: Secondary | ICD-10-CM | POA: Insufficient documentation

## 2022-09-05 LAB — COMPREHENSIVE METABOLIC PANEL
ALT: 13 U/L (ref 0–44)
AST: 17 U/L (ref 15–41)
Albumin: 4.1 g/dL (ref 3.5–5.0)
Alkaline Phosphatase: 83 U/L (ref 38–126)
Anion gap: 7 (ref 5–15)
BUN: 10 mg/dL (ref 6–20)
CO2: 25 mmol/L (ref 22–32)
Calcium: 9 mg/dL (ref 8.9–10.3)
Chloride: 105 mmol/L (ref 98–111)
Creatinine, Ser: 0.85 mg/dL (ref 0.44–1.00)
GFR, Estimated: >60 mL/min (ref >60)
Glucose, Bld: 98 mg/dL (ref 70–99)
Potassium: 4.2 mmol/L (ref 3.5–5.1)
Sodium: 137 mmol/L (ref 135–145)
Total Bilirubin: 0.3 mg/dL (ref 0.3–1.2)
Total Protein: 7.4 g/dL (ref 6.5–8.1)

## 2022-09-05 LAB — CBC
HCT: 39 % (ref 36.0–46.0)
Hemoglobin: 13 g/dL (ref 12.0–15.0)
MCH: 30 pg (ref 26.0–34.0)
MCHC: 33.3 g/dL (ref 30.0–36.0)
MCV: 89.9 fL (ref 80.0–100.0)
Platelets: 242 10*3/uL (ref 150–400)
RBC: 4.34 MIL/uL (ref 3.87–5.11)
RDW: 12.1 % (ref 11.5–15.5)
WBC: 4.4 10*3/uL (ref 4.0–10.5)
nRBC: 0 % (ref 0.0–0.2)

## 2022-09-05 LAB — URINALYSIS, ROUTINE W REFLEX MICROSCOPIC
Bilirubin Urine: NEGATIVE
Glucose, UA: NEGATIVE mg/dL
Hgb urine dipstick: NEGATIVE
Ketones, ur: NEGATIVE mg/dL
Leukocytes,Ua: NEGATIVE
Nitrite: NEGATIVE
Protein, ur: NEGATIVE mg/dL
Specific Gravity, Urine: 1.015 (ref 1.005–1.030)
pH: 6 (ref 5.0–8.0)

## 2022-09-05 LAB — I-STAT BETA HCG BLOOD, ED (MC, WL, AP ONLY): I-stat hCG, quantitative: <5 m[IU]/mL (ref <5)

## 2022-09-05 LAB — LIPASE, BLOOD: Lipase: 22 U/L (ref 11–51)

## 2022-09-05 MED ORDER — ONDANSETRON 4 MG PO TBDP: 4.0000 mg | ORAL_TABLET | Freq: Three times a day (TID) | ORAL | 0 refills | Status: DC | PRN

## 2022-09-05 MED ORDER — IOHEXOL 350 MG/ML SOLN
75.0000 mL | Freq: Once | INTRAVENOUS | Status: AC | PRN
  Administered 2022-09-05: 75 mL via INTRAVENOUS

## 2022-09-05 MED ORDER — OXYCODONE-ACETAMINOPHEN 5-325 MG PO TABS: 1.0000 | ORAL_TABLET | Freq: Once | ORAL | Status: DC

## 2022-09-05 NOTE — ED Triage Notes (Signed)
Pt with RLQ abdominal pain that occasionally radiates across to the LLQ X1 week with associated nausea and diarrhea. Denies dysuria.

## 2022-09-05 NOTE — Discharge Instructions (Signed)
Please read and follow all provided instructions.  Your diagnoses today include:  1. Right lower quadrant pain     Tests performed today include: Blood cell counts and platelets Kidney and liver function tests Pancreas function test (called lipase) Urine test to look for infection A blood or urine test for pregnancy (women only) CT scan of the abdomen did not show any causes of your pain, but also did not show any severe problems Vital signs. See below for your results today.   Medications prescribed:  Zofran (ondansetron) - for nausea and vomiting  Take any prescribed medications only as directed.  Home care instructions:  Follow any educational materials contained in this packet.  Use over-the-counter Tylenol for pain, this will be the best choice as you are breast-feeding  Follow-up instructions: Please follow-up with your primary care provider in the next 3 days for further evaluation of your symptoms.    Return instructions:  SEEK IMMEDIATE MEDICAL ATTENTION IF: The pain does not go away or becomes severe  A temperature above 101F develops  Repeated vomiting occurs (multiple episodes)  The pain becomes localized to portions of the abdomen. The right side could possibly be appendicitis. In an adult, the left lower portion of the abdomen could be colitis or diverticulitis.  Blood is being passed in stools or vomit (bright red or black tarry stools)  You develop chest pain, difficulty breathing, dizziness or fainting, or become confused, poorly responsive, or inconsolable (young children) If you have any other emergent concerns regarding your health  Additional Information: Abdominal (belly) pain can be caused by many things. Your caregiver performed an examination and possibly ordered blood/urine tests and imaging (CT scan, x-rays, ultrasound). Many cases can be observed and treated at home after initial evaluation in the emergency department. Even though you are being  discharged home, abdominal pain can be unpredictable. Therefore, you need a repeated exam if your pain does not resolve, returns, or worsens. Most patients with abdominal pain don't have to be admitted to the hospital or have surgery, but serious problems like appendicitis and gallbladder attacks can start out as nonspecific pain. Many abdominal conditions cannot be diagnosed in one visit, so follow-up evaluations are very important.  Your vital signs today were: BP 110/61 (BP Location: Right Arm)   Pulse (!) 58   Temp 97.7 F (36.5 C) (Oral)   Resp 16   SpO2 100%  If your blood pressure (bp) was elevated above 135/85 this visit, please have this repeated by your doctor within one month. --------------

## 2022-09-05 NOTE — ED Provider Notes (Signed)
[facility] EMERGENCY DEPARTMENT Provider Note   CSN: [id number] Arrival date & time: 09/05/22  0736     History  Chief Complaint  Patient presents with   Abdominal Pain    Priscilla Delgado is a 41 y.o. female.  Patient was evaluated by myself in triage on arrival.  Pt complains of right lower quadrant abdominal pain for 1 week.  She still has her gallbladder and appendix.  Pain was intermittent now persistent in the right groin and right lower quadrant area.  At times it radiates to her flank.  She denies dysuria, hematuria, increased frequency or urgency.  No fevers or vomiting.  No chest pain or shortness of breath.  She states that in the past she has had this pain with her menstrual period.  However, she is not currently on her period.  She does have a 11-month-old child at home and is currently breast-feeding.       Home Medications Prior to Admission medications   Medication Sig Start Date End Date Taking? Authorizing Provider  ondansetron (ZOFRAN-ODT) 4 MG disintegrating tablet Take 1 tablet (4 mg total) by mouth every 8 (eight) hours as needed for nausea or vomiting. 09/05/22  Yes Renne Crigler, PA-C  acetaminophen (TYLENOL) 500 MG tablet Take 500 mg by mouth every 6 (six) hours as needed for mild pain.    [provider]  Semaglutide-Weight Management (WEGOVY) 0.25 MG/0.5ML SOAJ Inject 0.25 mg into the skin every 7 (seven) days. 06/22/22   Tobb, Lavona Mound, DO      Allergies    Patient has no known allergies.    Review of Systems   Review of Systems  Physical Exam Updated Vital Signs BP 110/61 (BP Location: Right Arm)   Pulse (!) 58   Temp 97.7 F (36.5 C) (Oral)   Resp 16   SpO2 100%  Physical Exam Vitals and nursing note reviewed.  Constitutional:      General: She is not in acute distress.    Appearance: She is well-developed.  HENT:     Head: Normocephalic and atraumatic.     Right Ear: External ear normal.     Left Ear: External  ear normal.     Nose: Nose normal.  Eyes:     Conjunctiva/sclera: Conjunctivae normal.  Cardiovascular:     Rate and Rhythm: Normal rate and regular rhythm.     Heart sounds: No murmur heard. Pulmonary:     Effort: No respiratory distress.     Breath sounds: No wheezing, rhonchi or rales.  Abdominal:     Palpations: Abdomen is soft.     Tenderness: There is no abdominal tenderness. There is no guarding or rebound.     Comments: Patient moves in bed without guarding or difficulty.  Musculoskeletal:     Cervical back: Normal range of motion and neck supple.     Right lower leg: No edema.     Left lower leg: No edema.  Skin:    General: Skin is warm and dry.     Findings: No rash.  Neurological:     General: No focal deficit present.     Mental Status: She is alert. Mental status is at baseline.     Motor: No weakness.  Psychiatric:        Mood and Affect: Mood normal.     ED Results / Procedures / Treatments   Labs (all labs ordered are listed, but only abnormal results are displayed) Labs Reviewed  LIPASE,  BLOOD  COMPREHENSIVE METABOLIC PANEL  CBC  URINALYSIS, ROUTINE W REFLEX MICROSCOPIC  I-STAT BETA HCG BLOOD, ED ([facility], [facility], [facility] ONLY)    EKG None  Radiology CT ABDOMEN PELVIS W CONTRAST  Result Date: 09/05/2022 CLINICAL DATA:  Right lower quadrant abdominal pain EXAM: CT ABDOMEN AND PELVIS WITH CONTRAST TECHNIQUE: Multidetector CT imaging of the abdomen and pelvis was performed using the standard protocol following bolus administration of intravenous contrast. RADIATION DOSE REDUCTION: This exam was performed according to the departmental dose-optimization program which includes automated exposure control, adjustment of the mA and/or kV according to patient size and/or use of iterative reconstruction technique. CONTRAST:  9mL OMNIPAQUE IOHEXOL 350 MG/ML SOLN COMPARISON:  None Available. FINDINGS: Lower chest: No acute abnormality. Hepatobiliary: No focal liver abnormality  is seen. No gallstones, gallbladder wall thickening, or biliary dilatation. Pancreas: Unremarkable. No pancreatic ductal dilatation or surrounding inflammatory changes. Spleen: Normal in size without focal abnormality. Adrenals/Urinary Tract: Adrenal glands are unremarkable. Kidneys are normal, without renal calculi, focal lesion, or hydronephrosis. Bladder is unremarkable. Stomach/Bowel: The stomach appears normal. The appendix is visualized and appears normal. There is no bowel wall thickening, inflammation, or distension. Vascular/Lymphatic: No significant vascular findings are present. No enlarged abdominal or pelvic lymph nodes. Reproductive: Uterus and bilateral adnexa are unremarkable. Other: No free fluid or fluid collections identified. No signs of pneumoperitoneum. No abdominal wall hernia. Musculoskeletal: Bilateral L5 pars defects. No significant anterolisthesis. No acute or suspicious osseous findings. IMPRESSION: 1. No acute findings within the abdomen or pelvis. 2. 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 Electronically Signed   By: Signa Kell M.D.   On: 09/05/2022 11:23    Procedures Procedures    Medications Ordered in ED Medications  oxyCODONE-acetaminophen (PERCOCET/ROXICET) 5-325 MG per tablet 1 tablet (has no administration in time range)  iohexol (OMNIPAQUE) 350 MG/ML injection 75 mL (75 mLs Intravenous Contrast Given 09/05/22 1108)    ED Course/ Medical Decision Making/ A&P    Patient seen and examined. History obtained directly from patient. Work-up including labs, imaging, EKG ordered in triage, if performed, were reviewed.    Labs/EKG: Independently reviewed and interpreted.  This included: CBC unremarkable; CMP unremarkable; lipase normal; UA without signs of infection; pregnancy negative.  Imaging: Independently reviewed and interpreted.  This included: CT scan of the abdomen and pelvis, agree no acute findings.   Medications/Fluids: None  ordered  Most recent vital signs reviewed and are as follows: BP 110/61 (BP Location: Right Arm)   Pulse (!) 58   Temp 97.7 F (36.5 C) (Oral)   Resp 16   SpO2 100%   Initial impression: Generalized abdominal pain  Discussed findings today with patient.  On exam, her abdominal tenderness present earlier is resolved.  She is moving well without any complaints at the current time.  She is requesting expedited discharge so that she can get home to her 20-month-old.  Home treatment plan: Zofran as needed for nausea, Tylenol for pain she is breast-feeding  Return instructions discussed with patient: The patient was urged to return to the Emergency Department immediately with worsening of current symptoms, worsening abdominal pain, persistent vomiting, blood noted in stools, fever, or any other concerns. The patient verbalized understanding.   Follow-up instructions discussed with patient: Encouraged follow-up with PCP in 3 days if not improving.                          Medical Decision Making Amount and/or Complexity of  Data Reviewed Labs: ordered. Radiology: ordered.  Risk Prescription drug management.   For this patient's complaint of abdominal pain, the following conditions were considered on the differential diagnosis: gastritis/PUD, enteritis/duodenitis, appendicitis, cholelithiasis/cholecystitis, cholangitis, pancreatitis, ruptured viscus, colitis, diverticulitis, small/large bowel obstruction, proctitis, cystitis, pyelonephritis, ureteral colic, aortic dissection, aortic aneurysm. In women, ectopic pregnancy, pelvic inflammatory disease, ovarian cysts, and tubo-ovarian abscess were also considered. Atypical chest etiologies were also considered including ACS, PE, and pneumonia.   The patient's vital signs, pertinent lab work and imaging were reviewed and interpreted as discussed in the ED course. Hospitalization was considered for further testing, treatments, or serial  exams/observation. However as patient is well-appearing, has a stable exam, and reassuring studies today, I do not feel that they warrant admission at this time. This plan was discussed with the patient who verbalizes agreement and comfort with this plan and seems reliable and able to return to the Emergency Department with worsening or changing symptoms.          Final Clinical Impression(s) / ED Diagnoses Final diagnoses:  Right lower quadrant pain    Rx / DC Orders ED Discharge Orders          Ordered    ondansetron (ZOFRAN-ODT) 4 MG disintegrating tablet  Every 8 hours PRN        09/05/22 1321              Renne Crigler, PA-C 09/05/22 1327    Tegeler, Canary Brim, MD 09/05/22 1558

## 2022-09-05 NOTE — ED Provider Triage Note (Signed)
Emergency Medicine Provider Triage Evaluation Note  Priscilla Delgado , a 41 y.o. female  was evaluated in triage.  Pt complains of right lower quadrant abdominal pain for 1 week.  She still has her gallbladder and appendix.  Pain was intermittent now persistent in the right groin and right lower quadrant area.  At times it radiates to her flank.  She denies dysuria, hematuria, increased frequency or urgency.  Review of Systems  Positive: Abdominal pain, nausea Negative: Fever, Vomiting  Physical Exam  BP 134/89 (BP Location: Right Arm)   Pulse 88   Temp 98.3 F (36.8 C) (Oral)   Resp 18   SpO2 96%  Gen:   Awake, appears uncomfortable  resp:  Normal effort  MSK:   Moves extremities without difficulty  Other:  Tender to palpation in the right lower quadrant without rebound or guarding, patient appears uncomfortable ambulating  Medical Decision Making  Medically screening exam initiated at 7:59 AM.  Appropriate orders placed.  Meztli Burmester was informed that the remainder of the evaluation will be completed by another provider, this initial triage assessment does not replace that evaluation, and the importance of remaining in the ED until their evaluation is complete.     Renne Crigler, PA-C 09/05/22 [phone]

## 2022-09-15 ENCOUNTER — Encounter: Payer: Self-pay | Admitting: Cardiology

## 2022-09-15 ENCOUNTER — Ambulatory Visit: Payer: Medicaid Other | Attending: Cardiology | Admitting: Cardiology

## 2022-09-15 VITALS — BP 100/70 | HR 62 | Ht 66.0 in | Wt 308.0 lb

## 2022-09-15 DIAGNOSIS — O903 Peripartum cardiomyopathy: Secondary | ICD-10-CM

## 2022-09-15 DIAGNOSIS — O165 Unspecified maternal hypertension, complicating the puerperium: Secondary | ICD-10-CM | POA: Diagnosis not present

## 2022-09-15 NOTE — Patient Instructions (Signed)
Medication Instructions:  Your physician recommends that you continue on your current medications as directed. Please refer to the Current Medication list given to you today.  *If you need a refill on your cardiac medications before your next appointment, please call your pharmacy*   Lab Work: NONE If you have labs (blood work) drawn today and your tests are completely normal, you will receive your results only by: MyChart Message (if you have MyChart) OR A paper copy in the mail If you have any lab test that is abnormal or we need to change your treatment, we will call you to review the results.   Testing/Procedures: NONE   Follow-Up: At  [facility], you and your health needs are our priority.  As part of our continuing mission to provide you with exceptional heart care, we have created designated Provider Care Teams.  These Care Teams include your primary Cardiologist (physician) and Advanced Practice Providers (APPs -  Physician Assistants and Nurse Practitioners) who all work together to provide you with the care you need, when you need it.  We recommend signing up for the patient portal called "MyChart".  Sign up information is provided on this After Visit Summary.  MyChart is used to connect with patients for Virtual Visits (Telemedicine).  Patients are able to view lab/test results, encounter notes, upcoming appointments, etc.  Non-urgent messages can be sent to your provider as well.   To learn more about what you can do with MyChart, go to [email or web address].    Your next appointment:   1 year(s)  The format for your next appointment:   In Person  Provider:   Kardie Tobb, DO   

## 2022-09-15 NOTE — Progress Notes (Signed)
Cardiology Office Note:    Date:  09/17/2022   ID:  Priscilla Delgado, DOB [birth year], MRN [id number]  PCP:  Pcp, No  Cardiologist:  Thomasene Ripple, DO  Electrophysiologist:  None   Referring MD: No ref. provider found   " I am ok"   History of Present Illness:    Priscilla Delgado is a 41 y.o. 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  I first saw the patient in August 2023 after being referred to the cardio obstetrics clinic given her postpartum cardiomyopathy.  During that visit she has stopped all her antihypertensive medication I encouraged the patient that we needed to restart this medication and repeat an echocardiogram.  She also told me that she was breast-feeding.  Explained to the patient that medications that she was currently on would not affect breast-feeding.  She was then experiencing some palpitations we will place a monitor on the patient.  One of her biggest concern was weight loss I referred the patient to our pharmacist led weight loss clinic.  In the interim ZIO monitor did not show any evidence of arrhythmia her echocardiogram showed improved EF which was a recovery from her postpartum cardiomyopathy.  Today her biggest concern is weight loss.  She has follow-up with our pharmacist led weight loss clinic unfortunately the medications was not covered by Medicaid.  She has a few questions about this today.  Zio monitor Patch Wear Time:  3 days and 5 hours (2023-08-04T16:47:29-0400 to 2023-08-07T21:54:01-399)   Patient had a min HR of 49 bpm, max HR of 111 bpm, and avg HR of 73 bpm. Predominant underlying rhythm was Sinus Rhythm. Isolated SVEs were rare (<1.0%), and no SVE Couplets or SVE Triplets were present. No Isolated VEs, VE Couplets, or VE Triplets were  present.    No symptoms reported.    Conclusion: Normal/unremarkable study.  Past Medical History:  Diagnosis Date    Ulcer of the stomach and intestine     Past Surgical History:  Procedure Laterality Date   CESAREAN SECTION      Current Medications: Current Meds  Medication Sig   acetaminophen (TYLENOL) 500 MG tablet Take 500 mg by mouth every 6 (six) hours as needed for mild pain.     Allergies:   Patient has no known allergies.   Social History   Socioeconomic History   Marital status: Single    Spouse name: Not on file   Number of children: Not on file   Years of education: Not on file   Highest education level: Not on file  Occupational History   Not on file  Tobacco Use   Smoking status: Never   Smokeless tobacco: Never  Vaping Use   Vaping Use: Never used  Substance and Sexual Activity   Alcohol use: Yes    Alcohol/week: 1.0 standard drink of alcohol    Types: 1 Glasses of wine per week    Comment: sometimes on weekends   Drug use: Never   Sexual activity: Yes    Birth control/protection: None  Other Topics Concern   Not on file  Social History Narrative   ** Merged History Encounter **       Social Determinants of Health   Financial Resource Strain: Not on file  Food Insecurity: No Food Insecurity (11/26/2021)   Hunger Vital Sign    Worried About Running Out of Food in the Last Year: Never true  Ran Out of Food in the Last Year: Never true  Transportation Needs: No Transportation Needs (11/26/2021)   PRAPARE - Administrator, Civil ServiceTransportation    Lack of Transportation (Medical): No    Lack of Transportation (Non-Medical): No  Physical Activity: Not on file  Stress: Not on file  Social Connections: Not on file     Family History: The patient's family history is negative for ADD / ADHD, Diabetes, and Hypertension.  ROS:   Review of Systems  Constitution: Negative for decreased appetite, fever and weight gain.  HENT: Negative for congestion, ear discharge, hoarse voice and sore throat.   Eyes: Negative for discharge, redness, vision loss in right eye and visual halos.   Cardiovascular: Negative for chest pain, dyspnea on exertion, leg swelling, orthopnea and palpitations.  Respiratory: Negative for cough, hemoptysis, shortness of breath and snoring.   Endocrine: Negative for heat intolerance and polyphagia.  Hematologic/Lymphatic: Negative for bleeding problem. Does not bruise/bleed easily.  Skin: Negative for flushing, nail changes, rash and suspicious lesions.  Musculoskeletal: Negative for arthritis, joint pain, muscle cramps, myalgias, neck pain and stiffness.  Gastrointestinal: Negative for abdominal pain, bowel incontinence, diarrhea and excessive appetite.  Genitourinary: Negative for decreased libido, genital sores and incomplete emptying.  Neurological: Negative for brief paralysis, focal weakness, headaches and loss of balance.  Psychiatric/Behavioral: Negative for altered mental status, depression and suicidal ideas.  Allergic/Immunologic: Negative for HIV exposure and persistent infections.    EKGs/Labs/Other Studies Reviewed:    The following studies were reviewed today:   EKG:  None today   TTE 06/16/2022 Transthoracic echocardiogram Mar 20, 2022 IMPRESSIONS:  1. Mid / basal inferior wall hypokinesis . Left ventricular ejection  fraction, by estimation, is 45 to 50%. The left ventricle has mildly  decreased function. The left ventricle has no regional wall motion  abnormalities. The left ventricular internal  cavity size was mildly dilated. Left ventricular diastolic parameters were  normal.   2. Right ventricular systolic function is normal. The right ventricular  size is normal.   3. Left atrial size was mildly dilated.   4. The pericardial effusion is posterior to the left ventricle.   5. The mitral valve is normal in structure. No evidence of mitral valve  regurgitation. No evidence of mitral stenosis.   6. The aortic valve is tricuspid. Aortic valve regurgitation is not  visualized. No aortic stenosis is present.   7. The  inferior vena cava is normal in size with greater than 50%  respiratory variability, suggesting right atrial pressure of 3 mmHg.   FINDINGS   Left Ventricle: Mid / basal inferior wall hypokinesis. Left ventricular  ejection fraction, by estimation, is 45 to 50%. The left ventricle has  mildly decreased function. The left ventricle has no regional wall motion  abnormalities. The left ventricular  internal cavity size was mildly dilated. There is no left ventricular  hypertrophy. Left ventricular diastolic parameters were normal.   Right Ventricle: The right ventricular size is normal. No increase in  right ventricular wall thickness. Right ventricular systolic function is  normal.   Left Atrium: Left atrial size was mildly dilated.   Right Atrium: Right atrial size was normal in size.   Pericardium: Trivial pericardial effusion is present. The pericardial  effusion is posterior to the left ventricle.   Mitral Valve: The mitral valve is normal in structure. No evidence of  mitral valve regurgitation. No evidence of mitral valve stenosis.   Tricuspid Valve: The tricuspid valve is normal in structure. Tricuspid  valve regurgitation is mild . No evidence of tricuspid stenosis.   Aortic Valve: The aortic valve is tricuspid. Aortic valve regurgitation is  not visualized. No aortic stenosis is present. Aortic valve mean gradient  measures 6.0 mmHg. Aortic valve peak gradient measures 11.3 mmHg. Aortic  valve area, by VTI measures 2.05   cm.   Pulmonic Valve: The pulmonic valve was normal in structure. Pulmonic valve  regurgitation is mild. No evidence of pulmonic stenosis.   Aorta: The aortic root is normal in size and structure.   Venous: The inferior vena cava is normal in size with greater than 50%  respiratory variability, suggesting right atrial pressure of 3 mmHg.   IAS/Shunts: No atrial level shunt detected by color flow Doppler.   Recent Labs: 03/20/2022: B Natriuretic  Peptide 77.7 09/05/2022: ALT 13; BUN 10; Creatinine, Ser 0.85; Hemoglobin 13.0; Platelets 242; Potassium 4.2; Sodium 137  Recent Lipid Panel    Component Value Date/Time   CHOL 152 12/15/2016 1110   TRIG 56 12/15/2016 1110   HDL 50 (L) 12/15/2016 1110   CHOLHDL 3.0 12/15/2016 1110   VLDL 11 12/15/2016 1110   LDLCALC 91 12/15/2016 1110    Physical Exam:    VS:  BP 100/70   Pulse 62   Ht 5\' 6"  (1.676 m)   Wt (!) 308 lb (139.7 kg)   SpO2 97%   BMI 49.71 kg/m     Wt Readings from Last 3 Encounters:  09/15/22 (!) 308 lb (139.7 kg)  06/22/22 (!) 307 lb (139.3 kg)  05/28/22 (!) 303 lb 3.2 oz (137.5 kg)     GEN: Well nourished, well developed in no acute distress HEENT: Normal NECK: No JVD; No carotid bruits LYMPHATICS: No lymphadenopathy CARDIAC: S1S2 noted,RRR, no murmurs, rubs, gallops RESPIRATORY:  Clear to auscultation without rales, wheezing or rhonchi  ABDOMEN: Soft, non-tender, non-distended, +bowel sounds, no guarding. EXTREMITIES: No edema, No cyanosis, no clubbing MUSCULOSKELETAL:  No deformity  SKIN: Warm and dry NEUROLOGIC:  Alert and oriented x 3, non-focal PSYCHIATRIC:  Normal affect, good insight  ASSESSMENT:    1. Postpartum hypertension   2. Postpartum cardiomyopathy   3. Morbid obesity (HCC)    PLAN:     Thankfully she has recovered from her postpartum cardiomyopathy.  Her blood pressure is within target.  Now the biggest issue is weight loss.  Unfortunately the medication is not being covered by Medicaid.  He has questions that friends and other states have been able to be covered with this medication for her weight loss.  I explained to the patient that 07/28/22 has not implemented than Medicaid expansion therefore this is not covered.  Unfortunately there are also no other means of patient assistance to get this medication.  She plans to look into primary insurance she tells me. Once she can do that we will be able to move forward with this.   In the meantime I encouraged the patient to invest in diet and exercise.  The patient is in agreement with the above plan. The patient left the office in stable condition.  The patient will follow up in   Medication Adjustments/Labs and Tests Ordered: Current medicines are reviewed at length with the patient today.  Concerns regarding medicines are outlined above.  No orders of the defined types were placed in this encounter.  No orders of the defined types were placed in this encounter.   Patient Instructions  Medication Instructions:  Your physician recommends that you continue on your  current medications as directed. Please refer to the Current Medication list given to you today.  *If you need a refill on your cardiac medications before your next appointment, please call your pharmacy*   Lab Work: NONE If you have labs (blood work) drawn today and your tests are completely normal, you will receive your results only by: MyChart Message (if you have MyChart) OR A paper copy in the mail If you have any lab test that is abnormal or we need to change your treatment, we will call you to review the results.   Testing/Procedures: NONE   Follow-Up: At [facility], you and your health needs are our priority.  As part of our continuing mission to provide you with exceptional heart care, we have created designated Provider Care Teams.  These Care Teams include your primary Cardiologist (physician) and Advanced Practice Providers (APPs -  Physician Assistants and Nurse Practitioners) who all work together to provide you with the care you need, when you need it.  We recommend signing up for the patient portal called "MyChart".  Sign up information is provided on this After Visit Summary.  MyChart is used to connect with patients for Virtual Visits (Telemedicine).  Patients are able to view lab/test results, encounter notes, upcoming appointments, etc.  Non-urgent messages can be sent  to your provider as well.   To learn more about what you can do with MyChart, go to [email or web address].    Your next appointment:   1 year(s)  The format for your next appointment:   In Person  Provider:   Thomasene Ripple, DO    Adopting a Healthy Lifestyle.  Know what a healthy weight is for you (roughly BMI <25) and aim to maintain this   Aim for 7+ servings of fruits and vegetables daily   65-80+ fluid ounces of water or unsweet tea for healthy kidneys   Limit to max 1 drink of alcohol per day; avoid smoking/tobacco   Limit animal fats in diet for cholesterol and heart health - choose grass fed whenever available   Avoid highly processed foods, and foods high in saturated/trans fats   Aim for low stress - take time to unwind and care for your mental health   Aim for 150 min of moderate intensity exercise weekly for heart health, and weights twice weekly for bone health   Aim for 7-9 hours of sleep daily   When it comes to diets, agreement about the perfect plan isnt easy to find, even among the experts. Experts at the [facility] [employer] developed an idea known as the Healthy Eating Plate. Just imagine a plate divided into logical, healthy portions.   The emphasis is on diet quality:   Load up on vegetables and fruits - one-half of your plate: Aim for color and variety, and remember that potatoes dont count.   Go for whole grains - one-quarter of your plate: Whole wheat, barley, wheat berries, quinoa, oats, brown rice, and foods made with them. If you want pasta, go with whole wheat pasta.   Protein power - one-quarter of your plate: Fish, chicken, beans, and nuts are all healthy, versatile protein sources. Limit red meat.   The diet, however, does go beyond the plate, offering a few other suggestions.   Use healthy plant oils, such as olive, canola, soy, corn, sunflower and peanut. Check the labels, and avoid partially hydrogenated oil, which have  unhealthy trans fats.   If youre thirsty, drink water. Coffee  and tea are good in moderation, but skip sugary drinks and limit milk and dairy products to one or two daily servings.   The type of carbohydrate in the diet is more important than the amount. Some sources of carbohydrates, such as vegetables, fruits, whole grains, and beans-are healthier than others.   Finally, stay active  Signed, Thomasene Ripple, DO  09/17/2022 12:47 PM    [facility]

## 2022-11-15 ENCOUNTER — Ambulatory Visit (INDEPENDENT_AMBULATORY_CARE_PROVIDER_SITE_OTHER): Payer: Medicaid Other | Admitting: Nurse Practitioner

## 2022-11-15 ENCOUNTER — Encounter: Payer: Self-pay | Admitting: Nurse Practitioner

## 2022-11-15 VITALS — BP 105/59 | HR 64 | Temp 97.1°F | Ht 66.0 in | Wt 307.2 lb

## 2022-11-15 DIAGNOSIS — R1031 Right lower quadrant pain: Secondary | ICD-10-CM | POA: Diagnosis not present

## 2022-11-15 DIAGNOSIS — Z6841 Body Mass Index (BMI) 40.0 and over, adult: Secondary | ICD-10-CM | POA: Diagnosis not present

## 2022-11-15 NOTE — Assessment & Plan Note (Addendum)
Wt Readings from Last 3 Encounters:  11/15/22 (!) 307 lb 3.2 oz (139.3 kg)  09/15/22 (!) 308 lb (139.7 kg)  06/22/22 (!) 307 lb (139.3 kg)  Patient is still breast-feeding, wegovy not covered by her insurance.  She was encouraged to increase intake of whole food consisting mainly vegetables and protein less carbohydrate drinking at least 64 ounces of water daily engaging in regular moderate to vigorous exercises at least 150 minutes weekly.  Importance of portion control also discussed.  Benefits of healthy weight discussed with the patient.  She declined referral to medical weight management clinic today.

## 2022-11-15 NOTE — Assessment & Plan Note (Addendum)
No acute findings within the abdomen or pelvis on recent CT. Her pain could be menstrual cramps, No NSAID because she is lactating.  Take Tylenol 650 mg every 6 hours as needed for menstrual cramps, use of heating pads, drinking warm fluids encouraged . Patient requested for referred to gynecologist , referral placed today

## 2022-11-15 NOTE — Patient Instructions (Addendum)
Please take tylenol 650 mg every 6 hours as needed for your menstrual cramps.    It is important that you exercise regularly at least 30 minutes 5 times a week as tolerated  Think about what you will eat, plan ahead. Choose " clean, green, fresh or frozen" over canned, processed or packaged foods which are more sugary, salty and fatty. 70 to 75% of food eaten should be vegetables and fruit. Three meals at set times with snacks allowed between meals, but they must be fruit or vegetables. Aim to eat over a 12 hour period , example 7 am to 7 pm, and STOP after  your last meal of the day. Drink water,generally about 64 ounces per day, no other drink is as healthy. Fruit juice is best enjoyed in a healthy way, by EATING the fruit.  Thanks for choosing [facility] we consider it a privelige to serve you.

## 2022-11-15 NOTE — Progress Notes (Signed)
New Patient Office Visit  Subjective:  Patient ID: Priscilla Delgado, female    DOB: [birth year]  Age: 42 y.o. MRN: [id number]  CC:  Chief Complaint  Patient presents with   Abdominal Pain    With cycle  on the left side. U/S was done and came back normal.    HPI Amorie Rentz is a 42 y.o. female with past medical history of morbid obesity, subclinical hypothyroidism, post partum hypertension, acute systolic heart failure, postpartum cardiomyopathy who presents with complaints of right lower quadrant abdominal pain that occurs during her menstrual period.  States that this pain is worse between the second and fourth day of her menstruation.  She has been taking Tylenol as needed without relief.  She was evaluated in the emergency room 2 months ago, CT scan was normal.  She denies chest pain, shortness of breath, wheezing, fever, chills, nausea, vomiting, diarrhea  Obesity.  States that she eats a lot of food and does not exercise.  She was referred to the medical weight management clinic in the past but stated that she did not have a good experience with them.   Past Medical History:  Diagnosis Date   Ulcer of the stomach and intestine     Past Surgical History:  Procedure Laterality Date   CESAREAN SECTION      Family History  Problem Relation Age of Onset   ADD / ADHD Neg Hx    Diabetes Neg Hx    Hypertension Neg Hx     Social History   Socioeconomic History   Marital status: Single    Spouse name: Not on file   Number of children: Not on file   Years of education: Not on file   Highest education level: Not on file  Occupational History   Not on file  Tobacco Use   Smoking status: Never   Smokeless tobacco: Never  Vaping Use   Vaping Use: Never used  Substance and Sexual Activity   Alcohol use: Yes    Alcohol/week: 1.0 standard drink of alcohol    Types: 1 Glasses of wine per week    Comment: sometimes on weekends   Drug use: Never   Sexual activity: Yes     Birth control/protection: None  Other Topics Concern   Not on file  Social History Narrative   ** Merged History Encounter **       Social Determinants of Health   Financial Resource Strain: Not on file  Food Insecurity: No Food Insecurity (11/26/2021)   Hunger Vital Sign    Worried About Running Out of Food in the Last Year: Never true    Ran Out of Food in the Last Year: Never true  Transportation Needs: No Transportation Needs (11/26/2021)   PRAPARE - Hydrologist (Medical): No    Lack of Transportation (Non-Medical): No  Physical Activity: Not on file  Stress: Not on file  Social Connections: Not on file  Intimate Partner Violence: Not on file    ROS Review of Systems  Constitutional:  Negative for activity change, appetite change, chills and diaphoresis.  Respiratory:  Negative for apnea, cough, choking, chest tightness, shortness of breath and wheezing.   Cardiovascular: Negative.  Negative for chest pain, palpitations and leg swelling.  Gastrointestinal:  Negative for abdominal distention, abdominal pain, anal bleeding, blood in stool, constipation and diarrhea.  Genitourinary:  Negative for difficulty urinating, dyspareunia, dysuria, enuresis and flank pain.  Musculoskeletal:  Negative for arthralgias,  gait problem, joint swelling, myalgias and neck pain.  Neurological:  Negative for dizziness, facial asymmetry, light-headedness, numbness and headaches.  Psychiatric/Behavioral: Negative.  Negative for agitation, behavioral problems, confusion, decreased concentration, dysphoric mood and hallucinations.     Objective:   Today's Vitals: BP (!) 105/59   Pulse 64   Temp (!) 97.1 F (36.2 C)   Ht 5\' 6"  (1.676 m)   Wt (!) 307 lb 3.2 oz (139.3 kg)   LMP 11/08/2022   SpO2 100%   BMI 49.58 kg/m   Physical Exam Constitutional:      General: She is not in acute distress.    Appearance: She is obese. She is not ill-appearing, toxic-appearing or  diaphoretic.  Eyes:     General: No scleral icterus.    Extraocular Movements: Extraocular movements intact.  Cardiovascular:     Rate and Rhythm: Normal rate and regular rhythm.     Heart sounds: Normal heart sounds. No murmur heard.    No friction rub. No gallop.  Pulmonary:     Effort: Pulmonary effort is normal. No respiratory distress.     Breath sounds: Normal breath sounds. No stridor. No wheezing, rhonchi or rales.  Chest:     Chest wall: No tenderness.  Abdominal:     Palpations: Abdomen is soft. There is no shifting dullness, fluid wave, hepatomegaly, splenomegaly, mass or pulsatile mass.     Tenderness: There is no abdominal tenderness. There is no right CVA tenderness, left CVA tenderness, guarding or rebound.  Skin:    General: Skin is warm and dry.     Capillary Refill: Capillary refill takes less than 2 seconds.  Neurological:     Mental Status: She is alert and oriented to person, place, and time.     Cranial Nerves: No cranial nerve deficit.     Motor: No weakness.  Psychiatric:        Mood and Affect: Mood normal. Mood is not anxious or depressed.        Behavior: Behavior normal.     Assessment & Plan:   Problem List Items Addressed This Visit       Other   BMI 50.0-59.9, adult ([place]) - Primary    Wt Readings from Last 3 Encounters:  11/15/22 (!) 307 lb 3.2 oz (139.3 kg)  09/15/22 (!) 308 lb (139.7 kg)  06/22/22 (!) 307 lb (139.3 kg)  Patient is still breast-feeding, wegovy not covered by her insurance.  She was encouraged to increase intake of whole food consisting mainly vegetables and protein less carbohydrate drinking at least 64 ounces of water daily engaging in regular moderate to vigorous exercises at least 150 minutes weekly.  Importance of portion control also discussed.  Benefits of healthy weight discussed with the patient.  She declined referral to medical weight management clinic today.      Right lower quadrant abdominal pain    No acute  findings within the abdomen or pelvis on recent CT. Her pain could be menstrual cramps, No NSAID because she is lactating.  Take Tylenol 650 mg every 6 hours as needed for menstrual cramps, use of heating pads, drinking warm fluids encouraged . Patient requested for referred to gynecologist , referral placed today      Relevant Orders   Ambulatory referral to Gynecology    Outpatient Encounter Medications as of 11/15/2022  Medication Sig   acetaminophen (TYLENOL) 500 MG tablet Take 500 mg by mouth every 6 (six) hours as needed for mild pain.  No facility-administered encounter medications on file as of 11/15/2022.    Follow-up: Return in about 6 months (around 05/16/2023) for CPE.   Donell Beers, FNP

## 2022-12-09 ENCOUNTER — Institutional Professional Consult (permissible substitution): Payer: Medicaid Other | Admitting: Plastic Surgery

## 2022-12-13 ENCOUNTER — Ambulatory Visit: Payer: Self-pay | Admitting: Nurse Practitioner

## 2023-01-07 ENCOUNTER — Ambulatory Visit: Payer: Self-pay | Admitting: Nurse Practitioner

## 2023-01-20 ENCOUNTER — Other Ambulatory Visit: Payer: Self-pay

## 2023-01-20 ENCOUNTER — Emergency Department (HOSPITAL_COMMUNITY)
Admission: EM | Admit: 2023-01-20 | Discharge: 2023-01-21 | Disposition: A | Discharge status: Home / Self Care | Payer: Medicaid Other | Attending: Emergency Medicine | Admitting: Emergency Medicine

## 2023-01-20 DIAGNOSIS — L0100 Impetigo, unspecified: Secondary | ICD-10-CM | POA: Insufficient documentation

## 2023-01-20 DIAGNOSIS — J020 Streptococcal pharyngitis: Secondary | ICD-10-CM | POA: Diagnosis not present

## 2023-01-20 DIAGNOSIS — Z1152 Encounter for screening for COVID-19: Secondary | ICD-10-CM | POA: Diagnosis not present

## 2023-01-20 DIAGNOSIS — M545 Low back pain, unspecified: Secondary | ICD-10-CM | POA: Insufficient documentation

## 2023-01-20 NOTE — ED Provider Triage Note (Signed)
Emergency Medicine Provider Triage Evaluation Note  Priscilla Delgado , a 42 y.o. female  was evaluated in triage.  Pt complains of 2 days of subjective fever, body aches, chills, headache, nasal congestion.  Patient also complains of joint pain.  Patient denies shortness of breath, chest pain, nausea, vomiting  Review of Systems  Positive: As above Negative: As above  Physical Exam  There were no vitals taken for this visit. Gen:   Awake, no distress   Resp:  Normal effort  MSK:   Moves extremities without difficulty  Other:    Medical Decision Making  Medically screening exam initiated at 11:41 PM.  Appropriate orders placed.  Elberta Holle was informed that the remainder of the evaluation will be completed by another provider, this initial triage assessment does not replace that evaluation, and the importance of remaining in the ED until their evaluation is complete.     Dorothyann Peng, PA-C 01/20/23 [id number]

## 2023-01-20 NOTE — ED Triage Notes (Signed)
Patient reports fever with headache , nasal congestion , low back and generalized body and joints aches for 2 days .

## 2023-01-21 LAB — PREGNANCY, URINE: Preg Test, Ur: NEGATIVE

## 2023-01-21 LAB — COMPREHENSIVE METABOLIC PANEL
ALT: 15 U/L (ref 0–44)
AST: 19 U/L (ref 15–41)
Albumin: 3.9 g/dL (ref 3.5–5.0)
Alkaline Phosphatase: 70 U/L (ref 38–126)
Anion gap: 9 (ref 5–15)
BUN: 9 mg/dL (ref 6–20)
CO2: 22 mmol/L (ref 22–32)
Calcium: 8.9 mg/dL (ref 8.9–10.3)
Chloride: 102 mmol/L (ref 98–111)
Creatinine, Ser: 0.91 mg/dL (ref 0.44–1.00)
GFR, Estimated: >60 mL/min (ref >60)
Glucose, Bld: 101 mg/dL — ABNORMAL HIGH (ref 70–99)
Potassium: 3.8 mmol/L (ref 3.5–5.1)
Sodium: 133 mmol/L — ABNORMAL LOW (ref 135–145)
Total Bilirubin: 0.8 mg/dL (ref 0.3–1.2)
Total Protein: 6.9 g/dL (ref 6.5–8.1)

## 2023-01-21 LAB — URINALYSIS, ROUTINE W REFLEX MICROSCOPIC
Bilirubin Urine: NEGATIVE
Glucose, UA: NEGATIVE mg/dL
Hgb urine dipstick: NEGATIVE
Ketones, ur: NEGATIVE mg/dL
Nitrite: NEGATIVE
Protein, ur: NEGATIVE mg/dL
Specific Gravity, Urine: 1.012 (ref 1.005–1.030)
pH: 7 (ref 5.0–8.0)

## 2023-01-21 LAB — RESP PANEL BY RT-PCR (RSV, FLU A&B, COVID)  RVPGX2
Influenza A by PCR: NEGATIVE
Influenza B by PCR: NEGATIVE
Resp Syncytial Virus by PCR: NEGATIVE
SARS Coronavirus 2 by RT PCR: NEGATIVE

## 2023-01-21 LAB — CBC WITH DIFFERENTIAL/PLATELET
Abs Immature Granulocytes: 0.01 10*3/uL (ref 0.00–0.07)
Basophils Absolute: 0 10*3/uL (ref 0.0–0.1)
Basophils Relative: 0 %
Eosinophils Absolute: 0 10*3/uL (ref 0.0–0.5)
Eosinophils Relative: 0 %
HCT: 39.1 % (ref 36.0–46.0)
Hemoglobin: 13 g/dL (ref 12.0–15.0)
Immature Granulocytes: 0 %
Lymphocytes Relative: 14 %
Lymphs Abs: 1 10*3/uL (ref 0.7–4.0)
MCH: 29.8 pg (ref 26.0–34.0)
MCHC: 33.2 g/dL (ref 30.0–36.0)
MCV: 89.7 fL (ref 80.0–100.0)
Monocytes Absolute: 0.5 10*3/uL (ref 0.1–1.0)
Monocytes Relative: 8 %
Neutro Abs: 5.2 10*3/uL (ref 1.7–7.7)
Neutrophils Relative %: 78 %
Platelets: 211 10*3/uL (ref 150–400)
RBC: 4.36 MIL/uL (ref 3.87–5.11)
RDW: 13.2 % (ref 11.5–15.5)
WBC: 6.7 10*3/uL (ref 4.0–10.5)
nRBC: 0 % (ref 0.0–0.2)

## 2023-01-21 LAB — GROUP A STREP BY PCR: Group A Strep by PCR: DETECTED — AB

## 2023-01-21 MED ORDER — AMOXICILLIN-POT CLAVULANATE 875-125 MG PO TABS: 1.0000 | ORAL_TABLET | Freq: Two times a day (BID) | ORAL | 0 refills | Status: DC

## 2023-01-21 MED ORDER — DEXAMETHASONE 4 MG PO TABS
10.0000 mg | ORAL_TABLET | Freq: Once | ORAL | Status: AC
  Administered 2023-01-21: 10 mg via ORAL
  Filled 2023-01-21: qty 3

## 2023-01-21 MED ORDER — AMOXICILLIN-POT CLAVULANATE 875-125 MG PO TABS
1.0000 | ORAL_TABLET | Freq: Once | ORAL | Status: AC
  Administered 2023-01-21: 1 via ORAL
  Filled 2023-01-21: qty 1

## 2023-01-21 NOTE — ED Provider Notes (Signed)
[facility] Provider Note   CSN: [id number] Arrival date & time: 01/20/23  2336     History  Chief Complaint  Patient presents with   Fever    Body/Joints Ache    Priscilla Delgado is a 42 y.o. female.  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 Also with midline lower back pain. Intermittent headache, no neck pain or rigidity. No known sick contacts.    Fever      Home Medications Prior to Admission medications   Medication Sig Start Date End Date Taking? Authorizing Provider  amoxicillin-clavulanate (AUGMENTIN) 875-125 MG tablet Take 1 tablet by mouth every 12 (twelve) hours. 01/21/23  Yes Colletta Spillers, Corene Cornea, MD  acetaminophen (TYLENOL) 500 MG tablet Take 500 mg by mouth every 6 (six) hours as needed for mild pain.    [provider]      Allergies    Patient has no known allergies.    Review of Systems   Review of Systems  Constitutional:  Positive for fever.    Physical Exam Updated Vital Signs BP 114/74   Pulse 80   Temp 99.1 F (37.3 C) (Oral)   Resp 17   Ht 5\' 6"  (1.676 m)   Wt (!) 139 kg   SpO2 99%   BMI 49.46 kg/m  Physical Exam Vitals and nursing note reviewed.  Constitutional:      Appearance: She is well-developed.  HENT:     Head: Normocephalic.     Comments: Small lesion to left oral commisure with golden colored crusting. No obvious oral erythema or exudates.    Mouth/Throat:     Mouth: Mucous membranes are moist.  Eyes:     Pupils: Pupils are equal, round, and reactive to light.  Cardiovascular:     Rate and Rhythm: Normal rate and regular rhythm.  Pulmonary:     Effort: No respiratory distress.     Breath sounds: No stridor.  Abdominal:     General: Abdomen is flat. There is no distension.  Musculoskeletal:     Cervical back: Normal range of motion.  Skin:    General: Skin is warm and dry.   Neurological:     General: No focal deficit present.     Mental Status: She is alert.     ED Results / Procedures / Treatments   Labs (all labs ordered are listed, but only abnormal results are displayed) Labs Reviewed  GROUP A STREP BY PCR - Abnormal; Notable for the following components:      Result Value   Group A Strep by PCR DETECTED (*)    All other components within normal limits  COMPREHENSIVE METABOLIC PANEL - Abnormal; Notable for the following components:   Sodium 133 (*)    Glucose, Bld 101 (*)    All other components within normal limits  URINALYSIS, ROUTINE W REFLEX MICROSCOPIC - Abnormal; Notable for the following components:   Leukocytes,Ua TRACE (*)    Bacteria, UA RARE (*)    All other components within normal limits  RESP PANEL BY RT-PCR (RSV, FLU A&B, COVID)  RVPGX2  CBC WITH DIFFERENTIAL/PLATELET  PREGNANCY, URINE    EKG None  Radiology No results found.  Procedures Procedures    Medications Ordered in ED Medications  amoxicillin-clavulanate (AUGMENTIN) 875-125 MG per tablet 1 tablet (1 tablet Oral Given 01/21/23 0616)  dexamethasone (DECADRON) tablet 10 mg (10 mg  Oral Given 01/21/23 [phone])    ED Course/ Medical Decision Making/ A&P                             Medical Decision Making Amount and/or Complexity of Data Reviewed Radiology: ordered.  Risk Prescription drug management.  Appears to have impetigo possibly related to strep? Vs viral exanthem. Will check strep. Has some back pain and considered epidural abscess but no neuro changes, fever, leukocytosis or other evidence of systemic infection so will hold on imaging for same.  Found to have strep suggesting that the rash is also likely impetigo. Will treat systemically for both. No e/o deep neck space infection or epiglottitis.    Final Clinical Impression(s) / ED Diagnoses Final diagnoses:  Impetigo  Strep throat    Rx / DC Orders ED Discharge Orders          Ordered     amoxicillin-clavulanate (AUGMENTIN) 875-125 MG tablet  Every 12 hours        01/21/23 0605              Kipling Graser, Corene Cornea, MD 01/21/23 [phone]

## 2023-02-22 ENCOUNTER — Encounter: Payer: Self-pay | Admitting: Nurse Practitioner

## 2023-02-22 ENCOUNTER — Ambulatory Visit (INDEPENDENT_AMBULATORY_CARE_PROVIDER_SITE_OTHER): Payer: Medicaid Other | Admitting: Nurse Practitioner

## 2023-02-22 VITALS — BP 104/57 | HR 60 | Temp 97.3°F | Wt 299.0 lb

## 2023-02-22 DIAGNOSIS — R21 Rash and other nonspecific skin eruption: Secondary | ICD-10-CM

## 2023-02-22 DIAGNOSIS — Z3202 Encounter for pregnancy test, result negative: Secondary | ICD-10-CM

## 2023-02-22 DIAGNOSIS — M79601 Pain in right arm: Secondary | ICD-10-CM | POA: Insufficient documentation

## 2023-02-22 DIAGNOSIS — K5900 Constipation, unspecified: Secondary | ICD-10-CM

## 2023-02-22 LAB — POCT URINE PREGNANCY: Preg Test, Ur: NEGATIVE

## 2023-02-22 MED ORDER — TRIAMCINOLONE ACETONIDE 0.1 % EX CREA: 1.0000 | TOPICAL_CREAM | Freq: Two times a day (BID) | CUTANEOUS | 0 refills | Status: DC

## 2023-02-22 MED ORDER — POLYETHYLENE GLYCOL 3350 17 G PO PACK: 17.0000 g | PACK | Freq: Every day | ORAL | 0 refills | Status: DC | PRN

## 2023-02-22 NOTE — Assessment & Plan Note (Signed)
Apply Kenalog 0.1% cream twice daily Patient referred to dermatology

## 2023-02-22 NOTE — Progress Notes (Signed)
Acute Office Visit  Subjective:     Patient ID: Priscilla Delgado, female    DOB: [birth year], 42 y.o.   MRN: [id number]  Chief Complaint  Patient presents with   Arm Pain    Right upper arm   Rash    Left groin and inner thigh    Arm Pain  Pertinent negatives include no chest pain.  Rash Pertinent negatives include no congestion, cough, diarrhea, eye pain, fatigue, fever, rhinorrhea, shortness of breath, sore throat or vomiting.   Priscilla Delgado with past medical history of postpartum cardiomyopathy subclinical hypothyroidism obesity presenting with complaints of right upper arm pain left groin rash    Right arm pain. Patient complains of intermittent right upper arm pain for the past 2 weeks, does a lot of lifting at work, pain is aggravated with movement.  She has not taking any medication for her pain.  Rashes .Patient complains of rashes on her left groin that she first noticed about 3 months ago rashes are sometimes itchy.  No redness fever chills malaise.  Constipation.  Has 1 hard bowel movement about every 3 days, has to strain with each BM and notices blood in her stool.  She denies nausea, vomiting, abdominal pain, unintentional weight loss.  States that she eats plenty of vegetables and exercise.  She has not taking any medication for her constipation.   She would like a pregnancy test has regular menstrual cycle, her last menstrual cycle was 3 weeks ago. .   Review of Systems  Constitutional:  Negative for activity change, appetite change, chills, fatigue and fever.  HENT:  Negative for congestion, dental problem, ear discharge, ear pain, hearing loss, rhinorrhea, sinus pressure, sinus pain, sneezing and sore throat.   Eyes:  Negative for pain, discharge, redness and itching.  Respiratory:  Negative for cough, chest tightness, shortness of breath and wheezing.   Cardiovascular:  Negative for chest pain, palpitations and leg swelling.  Gastrointestinal:  Positive for anal  bleeding and constipation. Negative for abdominal distention, abdominal pain, blood in stool, diarrhea, nausea, rectal pain and vomiting.  Endocrine: Negative for cold intolerance, heat intolerance, polydipsia, polyphagia and polyuria.  Genitourinary:  Negative for difficulty urinating, dysuria, flank pain, frequency, hematuria, menstrual problem, pelvic pain and vaginal bleeding.  Musculoskeletal:  Negative for arthralgias.  Skin:  Positive for rash. Negative for color change, pallor and wound.  Allergic/Immunologic: Negative for environmental allergies, food allergies and immunocompromised state.  Neurological:  Negative for dizziness, tremors, facial asymmetry, weakness and headaches.  Hematological:  Negative for adenopathy. Does not bruise/bleed easily.  Psychiatric/Behavioral:  Negative for agitation, behavioral problems, confusion, decreased concentration, hallucinations, self-injury and suicidal ideas.         Objective:    BP (!) 104/57   Pulse 60   Temp (!) 97.3 F (36.3 C)   Wt 299 lb (135.6 kg)   SpO2 100%   BMI 48.26 kg/m    Physical Exam Vitals and nursing note reviewed.  Constitutional:      General: She is not in acute distress.    Appearance: Normal appearance. She is obese. She is not ill-appearing, toxic-appearing or diaphoretic.  HENT:     Mouth/Throat:     Mouth: Mucous membranes are moist.     Pharynx: Oropharynx is clear. No oropharyngeal exudate or posterior oropharyngeal erythema.  Eyes:     General: No scleral icterus.       Right eye: No discharge.        Left eye:  No discharge.     Extraocular Movements: Extraocular movements intact.     Conjunctiva/sclera: Conjunctivae normal.  Cardiovascular:     Rate and Rhythm: Normal rate and regular rhythm.     Pulses: Normal pulses.     Heart sounds: Normal heart sounds. No murmur heard.    No friction rub. No gallop.  Pulmonary:     Effort: Pulmonary effort is normal. No respiratory distress.      Breath sounds: Normal breath sounds. No stridor. No wheezing, rhonchi or rales.  Chest:     Chest wall: No tenderness.  Abdominal:     General: There is no distension.     Palpations: Abdomen is soft.     Tenderness: There is no abdominal tenderness. There is no right CVA tenderness, left CVA tenderness or guarding.  Musculoskeletal:        General: No swelling, tenderness, deformity or signs of injury.     Right lower leg: No edema.     Left lower leg: No edema.     Comments: Able to do range of motion of the right arm and shoulder without difficulty.  No tenderness or swelling on examination.  Skin warm and dry has palpable radial pulses  Skin:    General: Skin is warm and dry.     Capillary Refill: Capillary refill takes less than 2 seconds.     Coloration: Skin is not jaundiced or pale.     Findings: Rash present. No bruising, erythema or lesion.     Comments: Hyperpigmented flat rashes noted on left groin and thigh. No swelling, redness or discharge noted  Neurological:     Mental Status: She is alert and oriented to person, place, and time.     Motor: No weakness.     Coordination: Coordination normal.     Gait: Gait normal.  Psychiatric:        Mood and Affect: Mood normal.        Behavior: Behavior normal.        Thought Content: Thought content normal.        Judgment: Judgment normal.     No results found for any visits on 02/22/23.      Assessment & Plan:   Problem List Items Addressed This Visit       Musculoskeletal and Integument   Rash and other nonspecific skin eruption    Apply Kenalog 0.1% cream twice daily Patient referred to dermatology      Relevant Medications   triamcinolone cream (KENALOG) 0.1 %   Other Relevant Orders   Ambulatory referral to Dermatology     Other   Right arm pain    Pain is musculoskeletal in nature She was encouraged to take OTC Tylenol as needed Stretching exercises encouraged      Constipation - Primary     Chronic condition Patient encouraged to increase intake of fiber drink at least 64 ounces of water daily MiraLAX 17 g daily as needed ordered Importance of daily exercises discussed      Relevant Medications   polyethylene glycol (MIRALAX MIX-IN PAX) 17 g packet   Encounter for pregnancy test, result negative    Pregnancy test negative      Relevant Orders   POCT urine pregnancy    Meds ordered this encounter  Medications   polyethylene glycol (MIRALAX MIX-IN PAX) 17 g packet    Sig: Take 17 g by mouth daily as needed.    Dispense:  14 each  Refill:  0   triamcinolone cream (KENALOG) 0.1 %    Sig: Apply 1 Application topically 2 (two) times daily.    Dispense:  30 g    Refill:  0    No follow-ups on file.  Donell Beers, FNP

## 2023-02-22 NOTE — Assessment & Plan Note (Signed)
Pain is musculoskeletal in nature She was encouraged to take OTC Tylenol as needed Stretching exercises encouraged

## 2023-02-22 NOTE — Patient Instructions (Signed)
1. Constipation, unspecified constipation type   For constipation it is important that you have an adequate intake of fruit and vegetables daily, at least 3 servings of each, as well as water intake of at least 64  ounces daily and regular exercise.  OTC stool softeners are helpful for daily use, up to 4 daily (eg. Colace)  Fiber intake daily is needed, in the form of Bran or Shredded Wheat  -   POCT urine pregnancy - polyethylene glycol (MIRALAX MIX-IN PAX) 17 g packet; Take 17 g by mouth daily as needed.  Dispense: 14 each; Refill: 0  2. Right arm pain   3. Rash and other nonspecific skin eruption  - triamcinolone cream (KENALOG) 0.1 %; Apply 1 Application topically 2 (two) times daily.  Dispense: 30 g; Refill: 0 - Ambulatory referral to Dermatology     It is important that you exercise regularly at least 30 minutes 5 times a week as tolerated  Think about what you will eat, plan ahead. Choose " clean, green, fresh or frozen" over canned, processed or packaged foods which are more sugary, salty and fatty. 70 to 75% of food eaten should be vegetables and fruit. Three meals at set times with snacks allowed between meals, but they must be fruit or vegetables. Aim to eat over a 12 hour period , example 7 am to 7 pm, and STOP after  your last meal of the day. Drink water,generally about 64 ounces per day, no other drink is as healthy. Fruit juice is best enjoyed in a healthy way, by EATING the fruit.  Thanks for choosing [facility] we consider it a privelige to serve you.

## 2023-02-22 NOTE — Assessment & Plan Note (Signed)
Chronic condition Patient encouraged to increase intake of fiber drink at least 64 ounces of water daily MiraLAX 17 g daily as needed ordered Importance of daily exercises discussed

## 2023-02-22 NOTE — Assessment & Plan Note (Signed)
Pregnancy test negative

## 2023-02-27 ENCOUNTER — Emergency Department (HOSPITAL_COMMUNITY)
Admission: EM | Admit: 2023-02-27 | Discharge: 2023-02-28 | Disposition: A | Discharge status: Home / Self Care | Payer: Medicaid Other | Attending: Emergency Medicine | Admitting: Emergency Medicine

## 2023-02-27 DIAGNOSIS — K219 Gastro-esophageal reflux disease without esophagitis: Secondary | ICD-10-CM | POA: Insufficient documentation

## 2023-02-27 DIAGNOSIS — J029 Acute pharyngitis, unspecified: Secondary | ICD-10-CM | POA: Diagnosis not present

## 2023-02-27 DIAGNOSIS — M545 Low back pain, unspecified: Secondary | ICD-10-CM | POA: Diagnosis not present

## 2023-02-28 ENCOUNTER — Other Ambulatory Visit: Payer: Self-pay

## 2023-02-28 ENCOUNTER — Encounter (HOSPITAL_COMMUNITY): Payer: Self-pay

## 2023-02-28 MED ORDER — METHOCARBAMOL 500 MG PO TABS: 500.0000 mg | ORAL_TABLET | Freq: Two times a day (BID) | ORAL | 0 refills | Status: DC

## 2023-02-28 MED ORDER — PANTOPRAZOLE SODIUM 40 MG PO TBEC: 40.0000 mg | DELAYED_RELEASE_TABLET | Freq: Every day | ORAL | 0 refills | Status: DC

## 2023-02-28 NOTE — ED Provider Notes (Signed)
Priscilla Delgado EMERGENCY DEPARTMENT AT [facility] Provider Note   CSN: [id number] Arrival date & time: 02/27/23  2349     History  Chief Complaint  Patient presents with   Sore Throat   Back Pain    Priscilla Delgado is a 42 y.o. female.  42 year old female presents with couple complaints.  She mentions she has had sore throat for about a week which has been intermittent.  Denies other URI symptoms.  States she has uncontrolled acid reflux she also complains of lower back pain.  States she occasionally gets low back pain however she contributes this episode to her outpatient which she has been out for about 2 months now.  She states she is constantly bending, lifting stuff, and rotating a certain way.  She has not taken anything for this prior to arrival.  Back pain has been ongoing for the past 3 days.  She denies saddle anesthesia, unintentional weight loss, history of IV drug use, history of malignancy, history of trauma to her back, or fever.  The history is provided by the patient. No language interpreter was used.       Home Medications Prior to Admission medications   Medication Sig Start Date End Date Taking? Authorizing Provider  acetaminophen (TYLENOL) 500 MG tablet Take 500 mg by mouth every 6 (six) hours as needed for mild pain.    [provider]  amoxicillin-clavulanate (AUGMENTIN) 875-125 MG tablet Take 1 tablet by mouth every 12 (twelve) hours. Patient not taking: Reported on 02/22/2023 01/21/23   Mesner, Barbara Cower, MD  methocarbamol (ROBAXIN) 500 MG tablet Take 1 tablet (500 mg total) by mouth 2 (two) times daily. 02/28/23  Yes Merl Bommarito, PA-C  pantoprazole (PROTONIX) 40 MG tablet Take 1 tablet (40 mg total) by mouth daily. 02/28/23  Yes Mariaceleste Herrera, PA-C  polyethylene glycol (MIRALAX MIX-IN PAX) 17 g packet Take 17 g by mouth daily as needed. 02/22/23   Donell Beers, FNP  triamcinolone cream (KENALOG) 0.1 % Apply 1 Application topically 2 (two) times daily.  02/22/23   Donell Beers, FNP      Allergies    Patient has no known allergies.    Review of Systems   Review of Systems  Constitutional:  Negative for chills and fever.  HENT:  Positive for sore throat. Negative for congestion, postnasal drip, trouble swallowing and voice change.   Respiratory:  Negative for cough.   Genitourinary:  Negative for dysuria.  Musculoskeletal:  Positive for back pain.  Neurological:  Negative for light-headedness.  All other systems reviewed and are negative.   Physical Exam Updated Vital Signs BP 121/69 (BP Location: Right Arm)   Pulse 66   Temp 98.5 F (36.9 C) (Oral)   Resp 19   SpO2 99%  Physical Exam Vitals and nursing note reviewed.  Constitutional:      General: She is not in acute distress.    Appearance: Normal appearance. She is not ill-appearing.  HENT:     Head: Normocephalic and atraumatic.     Nose: Nose normal.     Mouth/Throat:     Mouth: Mucous membranes are moist.     Pharynx: No oropharyngeal exudate or posterior oropharyngeal erythema.     Comments: Without evidence of retropharyngeal abscess, peritonsillar abscess, or Ludwig's angina. Eyes:     General: No scleral icterus.    Extraocular Movements: Extraocular movements intact.     Conjunctiva/sclera: Conjunctivae normal.  Cardiovascular:     Rate and Rhythm:  Normal rate and regular rhythm.     Pulses: Normal pulses.  Pulmonary:     Effort: Pulmonary effort is normal. No respiratory distress.     Breath sounds: Normal breath sounds. No wheezing or rales.  Abdominal:     General: There is no distension.     Tenderness: There is no abdominal tenderness.  Musculoskeletal:        General: Normal range of motion.     Cervical back: Normal range of motion.  Skin:    General: Skin is warm and dry.  Neurological:     General: No focal deficit present.     Mental Status: She is alert. Mental status is at baseline.     ED Results / Procedures / Treatments    Labs (all labs ordered are listed, but only abnormal results are displayed) Labs Reviewed - No data to display  EKG None  Radiology No results found.  Procedures Procedures    Medications Ordered in ED Medications - No data to display  ED Course/ Medical Decision Making/ A&P                             Medical Decision Making Risk Prescription drug management.   42 year old female presents with complaints of pharyngitis, and low back pain.  These are 2 separate complaints.  Low back pain ongoing for the past 3 days.  Without red flag signs or symptoms concerning for cauda equina syndrome, or spinal epidural abscess.  She attributes this to her current occupation which requires a lot of lifting and bending.  Will prescribe Robaxin.  Symptomatic management discussed.  Her pharyngitis appears to be from uncontrolled acid reflux.  She states she does have heartburn after eating certain types of foods.  She has not taken anything for this either.  No URI symptoms.  No signs of an abscess.  Comments.  She will follow-up with her PCP.  She is appropriate for discharge.  Discharged in stable condition.  Patient voices understanding and is in agreement with plan.   Final Clinical Impression(s) / ED Diagnoses Final diagnoses:  Acute bilateral low back pain without sciatica  Gastroesophageal reflux disease, unspecified whether esophagitis present    Rx / DC Orders ED Discharge Orders          Ordered    pantoprazole (PROTONIX) 40 MG tablet  Daily        02/28/23 0016    methocarbamol (ROBAXIN) 500 MG tablet  2 times daily        02/28/23 0016              Marita Kansas, PA-C 02/28/23 0025    Dione Booze, MD 02/28/23 [phone]

## 2023-02-28 NOTE — Discharge Instructions (Addendum)
Your exam today was reassuring.  No concerning findings.  Your sore throat given your history of heartburn could be from uncontrolled acid reflux.  I have sent in Protonix.  Low back pain you state is likely from your occupation.  I sent in a muscle relaxer.  You can take Tylenol, and ibuprofen in addition to this medication.  Robaxin is a muscle relaxer and they will make you drowsy.  Do not drive or do anything else that is dangerous after taking this medication.  For any concerning symptoms return to the emergency department.

## 2023-02-28 NOTE — ED Triage Notes (Signed)
Pt arrives with c/o sore throat that started 3 days ago. Pt also endorses lower back pain.

## 2023-03-03 ENCOUNTER — Telehealth: Payer: Self-pay | Admitting: Family Medicine

## 2023-03-03 NOTE — Telephone Encounter (Signed)
Spoke with patient with an Interpreter to confirm appointment.

## 2023-03-07 ENCOUNTER — Other Ambulatory Visit (HOSPITAL_COMMUNITY)
Admission: RE | Admit: 2023-03-07 | Discharge: 2023-03-07 | Disposition: A | Discharge status: Home / Self Care | Payer: Medicaid Other | Source: Ambulatory Visit

## 2023-03-07 ENCOUNTER — Other Ambulatory Visit: Payer: Self-pay

## 2023-03-07 ENCOUNTER — Encounter: Payer: Self-pay | Admitting: Family Medicine

## 2023-03-07 ENCOUNTER — Ambulatory Visit: Payer: Medicaid Other

## 2023-03-07 VITALS — BP 110/65 | HR 54 | Ht 66.0 in | Wt 295.9 lb

## 2023-03-07 DIAGNOSIS — N945 Secondary dysmenorrhea: Secondary | ICD-10-CM

## 2023-03-07 DIAGNOSIS — N898 Other specified noninflammatory disorders of vagina: Secondary | ICD-10-CM

## 2023-03-07 DIAGNOSIS — Z124 Encounter for screening for malignant neoplasm of cervix: Secondary | ICD-10-CM

## 2023-03-07 DIAGNOSIS — Z1239 Encounter for other screening for malignant neoplasm of breast: Secondary | ICD-10-CM | POA: Diagnosis not present

## 2023-03-07 MED ORDER — IBUPROFEN 600 MG PO TABS: 600.0000 mg | ORAL_TABLET | Freq: Four times a day (QID) | ORAL | 1 refills | Status: DC | PRN

## 2023-03-07 NOTE — Progress Notes (Signed)
Pt reports having real bad cramps, starting after her last child was born.

## 2023-03-07 NOTE — Progress Notes (Signed)
Subjective:     Priscilla Delgado is a 42 y.o. female here at [facility] for a routine exam. PMHx postpartum cardiomyopathy. Current complaints: bad menstrual cramps and heavy bleeding during periods. She reports since she delivered her last baby in 02/2022 she has had very painful periods along with heavy bleeding. She reports the pain usually starts on day 2 of her period. The pain occurs on both sides, but only one side at a time. She takes Tylenol which does not help. She has also noticed that her periods have increased from 3 days to 7 days. She reports she has to wear "big pads" and changes them twice daily on the heaviest days of her period. She denies odor, or itching. No urinary s/s. She denies feeling faint. She is sexually active but has not been in the past 3-4 weeks.     Flowsheet Row Office [facility]  PHQ-2 Total Score 0       Health Maintenance Due  Topic Date Due   PAP SMEAR-Modifier  11/27/2021   COVID-19 Vaccine (2 - 2023-24 season) 06/25/2022    Risk factors for chronic health problems: Smoking: denies Alchohol/how much: denies Illicit drug use: denies Pt BMI: Body mass index is 47.76 kg/m.   Gynecologic History Patient's last menstrual period was 02/23/2023 (approximate). Contraception: none Sexual health: no issues Last Pap: 02/2019. Results were: NILM, negative HRHPV Last mammogram: has never had.  Obstetric History OB History  Gravida Para Term Preterm AB Living  6 6 6  0 0 6  SAB IAB Ectopic Multiple Live Births  0 0 0 0 6    # Outcome Date GA Lbr Len/2nd Weight Sex Delivery Anes PTL Lv  6 Term 03/14/22 [redacted]w[redacted]d 25:01 / 00:08 7 lb 3 oz (3.26 kg) F VBAC EPI  LIV     Birth Comments: WDL  5 Term 03/27/20 [redacted]w[redacted]d 00:24 / 00:01 7 lb 3 oz (3.26 kg) F Vag-Spont None  LIV  4 Term 02/19/19 [redacted]w[redacted]d 14:01 / 00:06 7 lb 9.2 oz (3.435 kg) M VBAC None  LIV  3 Term 03/14/15 [redacted]w[redacted]d 04:09 / 00:15 6 lb  8.9 oz (2.975 kg) M VBAC None  LIV  2 Term 07/03/10 [redacted]w[redacted]d  5 lb 8.2 oz (2.5 kg) F Vag-Spont   LIV  1 Term 10/19/08 109w0d  6 lb 9.8 oz (3 kg) M CS-Unspec Spinal  LIV    Obstetric Comments  C/s for breech- leg was coming down   The following portions of the patient's history were reviewed and updated as appropriate: allergies, current medications, past family history, past medical history, past social history, past surgical history, and problem list.  Review of Systems Pertinent items are noted in HPI.    Objective:  BP 110/65   Pulse (!) 54   Ht 5\' 6"  (1.676 m)   Wt 295 lb 14.4 oz (134.2 kg)   LMP 02/23/2023 (Approximate)   BMI 47.76 kg/m   VS reviewed, nursing note reviewed,  Constitutional: well developed, well nourished, no distress HEENT: normocephalic, thyroid without enlargement or mass HEART: RRR, no murmurs rubs/gallops RESP: clear and equal to auscultation bilaterally in all lobes  Breast Exam: Deferred with low risks and shared decision making, discussed recommendation to start mammogram between 40-50 yo/ exam Abdomen: soft Neuro: alert and oriented x 3 Skin: warm, dry Psych: affect normal Pelvic exam: Cervix pink, visually closed, without lesion, scant white creamy discharge, vaginal walls  and external genitalia normal Bimanual exam: Cervix 0/long/high, firm, anterior, neg CMT, uterus nontender, nonenlarged, adnexa without tenderness, enlargement, or mass     Assessment/Plan:  1. Cervical cancer screening  - Cytology - PAP( [place])  2. Breast screening  - MM 3D SCREENING MAMMOGRAM BILATERAL BREAST; Future  3. Secondary dysmenorrhea - Recommend labs as patient has not had recently. Will also order pelvic ultrasound  - Will also obtain GC/CT but have low suspicion this is etiology - Some differentials include perimenopause, fibroids, adenomyosis - Patient to trial a course of NSAIDs. If labs and ultrasound normal, or NSAIDs not helpful, bring back to  discuss alternative methods    - TSH - CBC - HgB A1c - US PELVIC COMPLETE WITH TRANSVAGINAL; Future  4. Vaginal discharge  - Cervicovaginal ancillary only    No follow-ups on file.   Brand Males, CNM 10:57 AM

## 2023-03-08 LAB — CBC
Hematocrit: 36.9 % (ref 34.0–46.6)
Hemoglobin: 12 g/dL (ref 11.1–15.9)
MCH: 28.6 pg (ref 26.6–33.0)
MCHC: 32.5 g/dL (ref 31.5–35.7)
MCV: 88 fL (ref 79–97)
Platelets: 258 10*3/uL (ref 150–450)
RBC: 4.2 x10E6/uL (ref 3.77–5.28)
RDW: 12.5 % (ref 11.7–15.4)
WBC: 5.2 10*3/uL (ref 3.4–10.8)

## 2023-03-08 LAB — CERVICOVAGINAL ANCILLARY ONLY
Chlamydia: NEGATIVE
Comment: NEGATIVE
Comment: NEGATIVE
Comment: NORMAL
Neisseria Gonorrhea: NEGATIVE
Trichomonas: NEGATIVE

## 2023-03-08 LAB — TSH: TSH: 3.18 u[IU]/mL (ref 0.450–4.500)

## 2023-03-08 LAB — HEMOGLOBIN A1C
Est. average glucose Bld gHb Est-mCnc: 111 mg/dL
Hgb A1c MFr Bld: 5.5 % (ref 4.8–5.6)

## 2023-03-10 LAB — CYTOLOGY - PAP
Comment: NEGATIVE
Diagnosis: NEGATIVE
High risk HPV: NEGATIVE

## 2023-04-27 ENCOUNTER — Ambulatory Visit: Payer: Medicaid Other

## 2023-05-18 ENCOUNTER — Ambulatory Visit: Payer: Self-pay | Admitting: Nurse Practitioner

## 2023-05-18 ENCOUNTER — Institutional Professional Consult (permissible substitution): Payer: Medicaid Other | Admitting: Plastic Surgery

## 2023-06-04 IMAGING — US US OB COMP LESS 14 WK
1 series · 15 of 28 positions shown · non-contrast
Comparison: None.

CLINICAL DATA: Pregnant, left lower quadrant abdominal pain

EXAM:
OBSTETRIC <14 WK US AND TRANSVAGINAL OB US
TECHNIQUE: Both transabdominal and transvaginal ultrasound examinations were
performed for complete evaluation of the gestation as well as the
maternal uterus, adnexal regions, and pelvic cul-de-sac.
Transvaginal technique was performed to assess early pregnancy.

[Series 1: us ob comp less 14 wk · 15 of 33 slices shown]
[im 1/33]
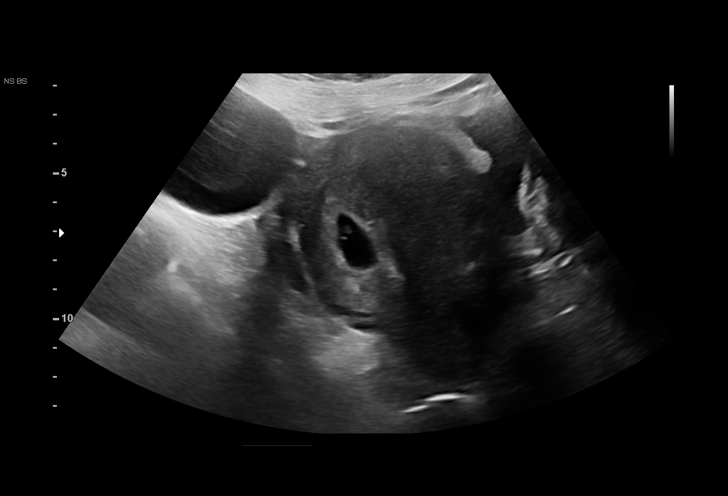
[im 3/33]
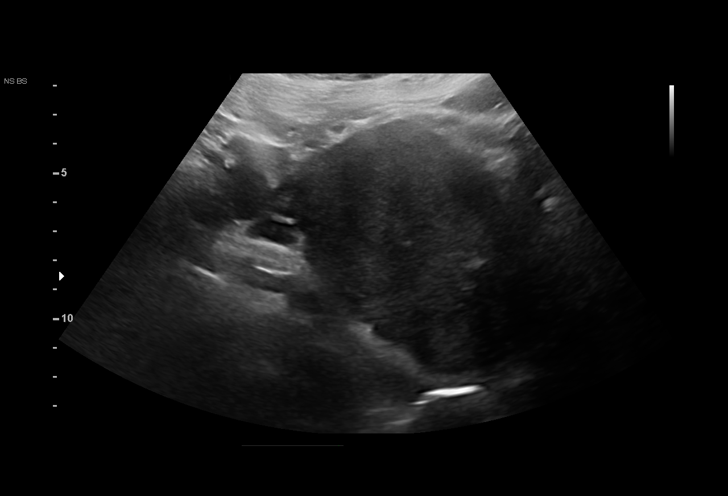
[im 5/33]
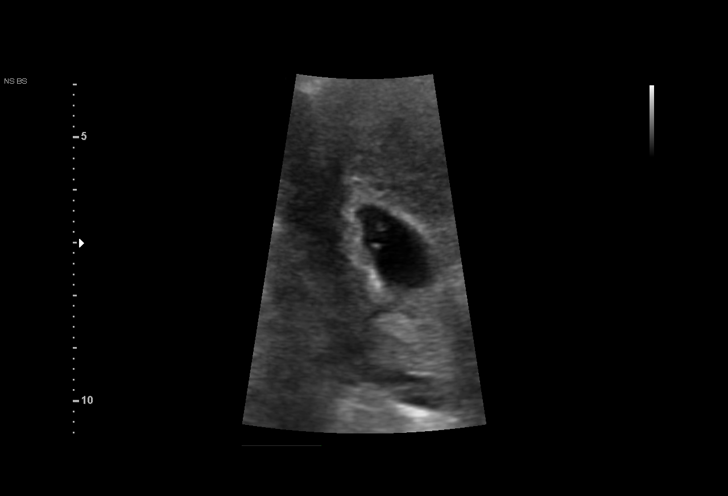
[im 8/33]
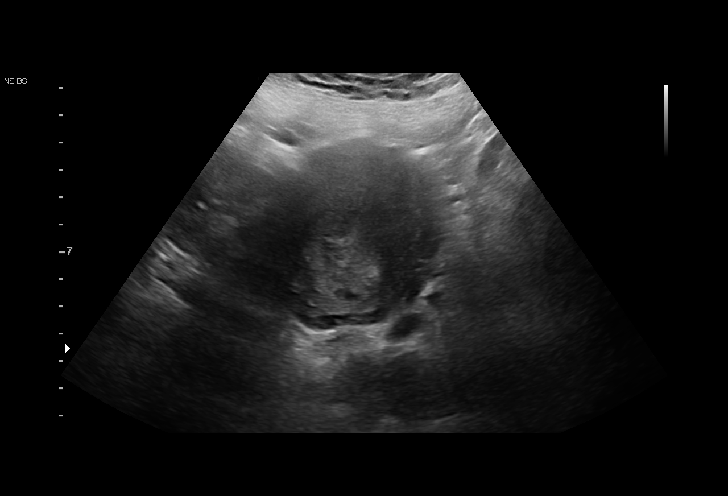
[im 10/33]
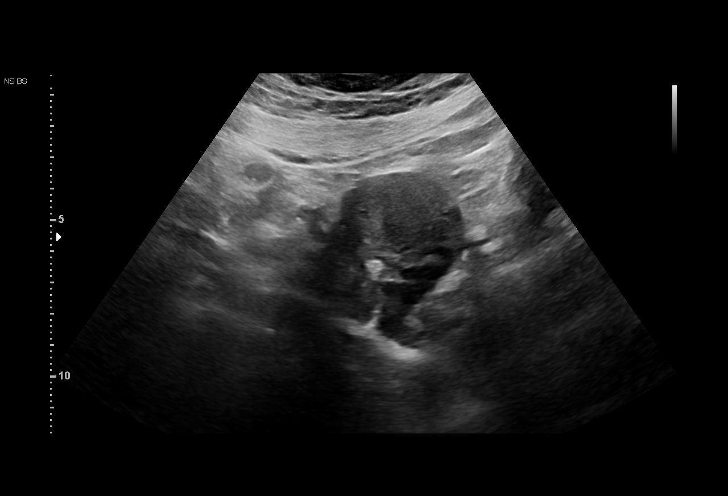
[im 12/33]
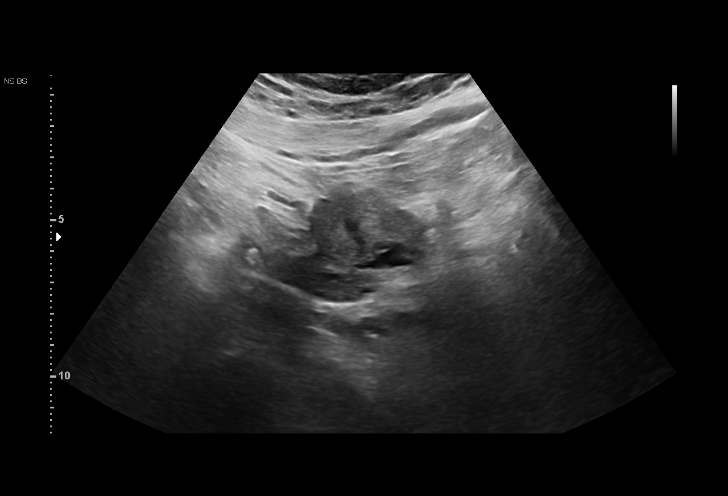
[im 15/33]
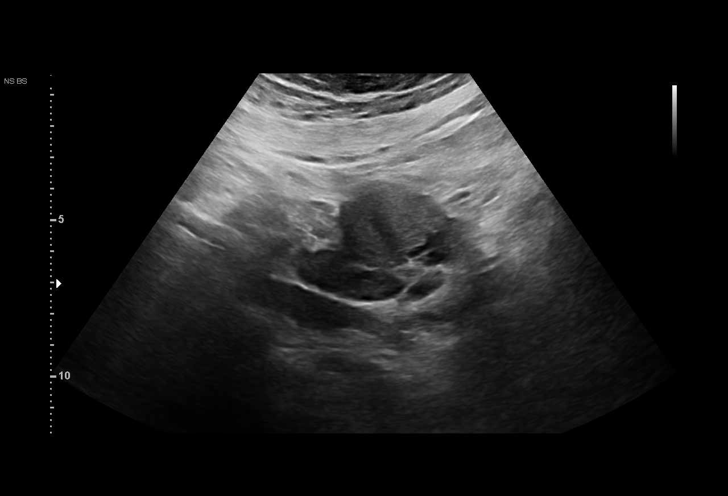
[im 17/33]
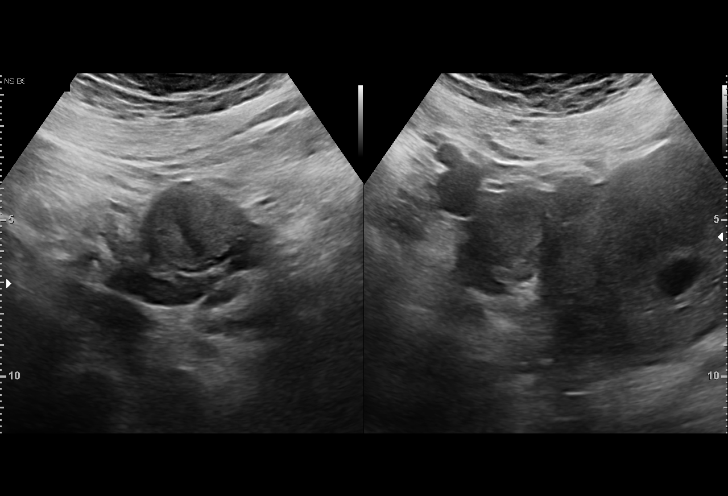
[im 18/33]
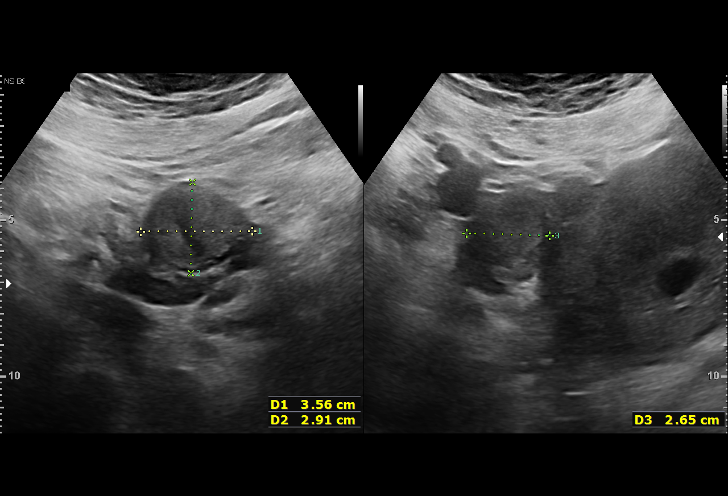
[im 21/33]
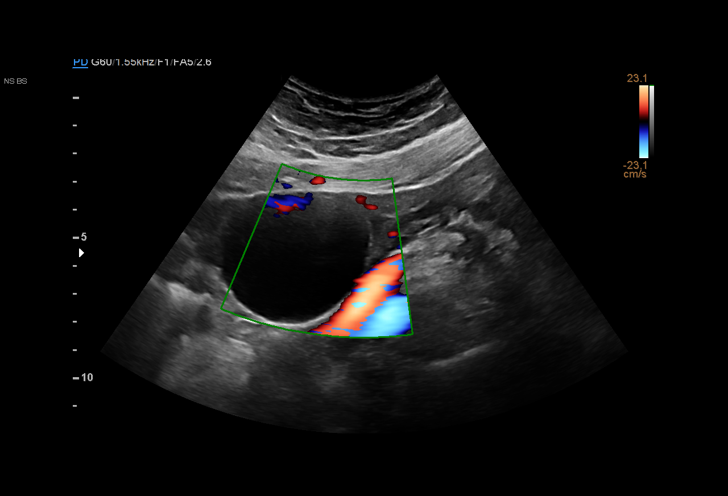
[im 23/33]
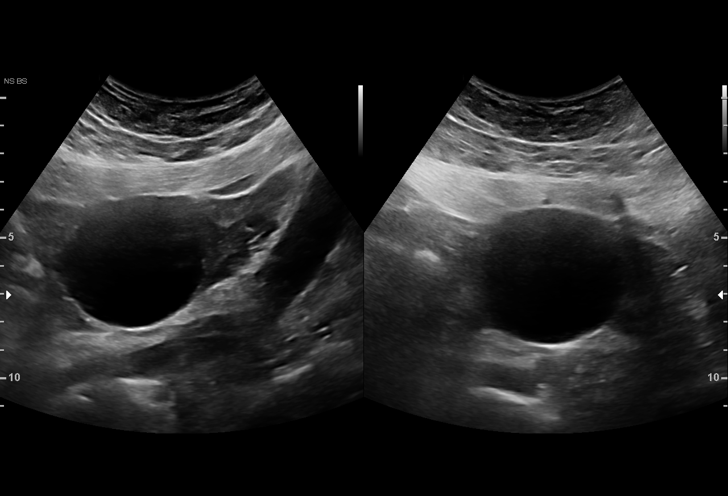
[im 25/33]
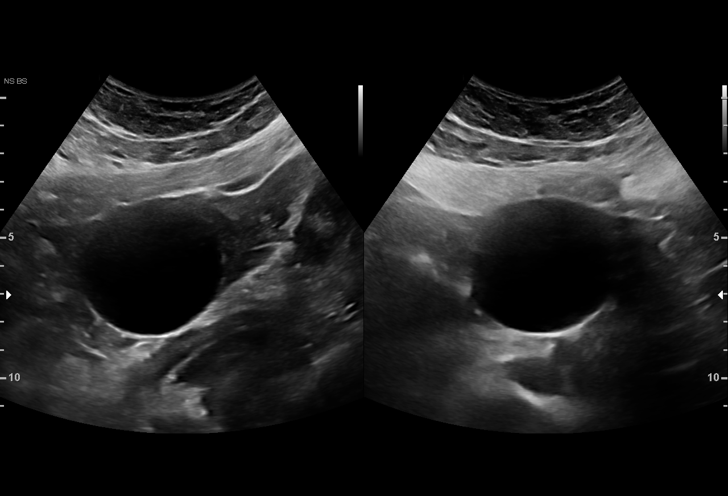
[im 28/33]
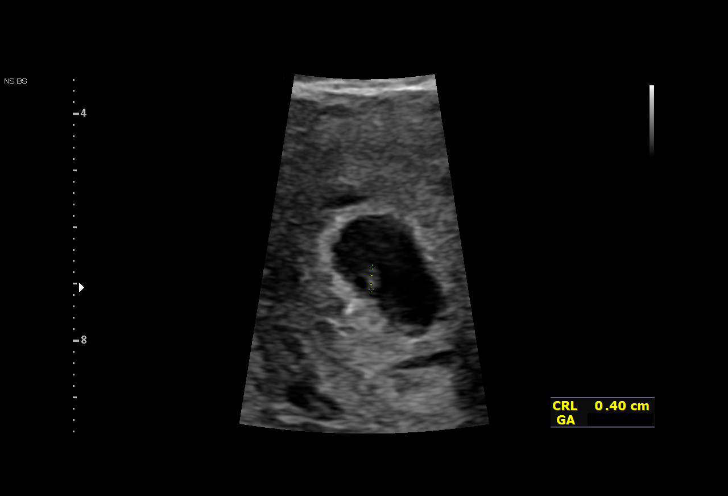
[im 30/33]
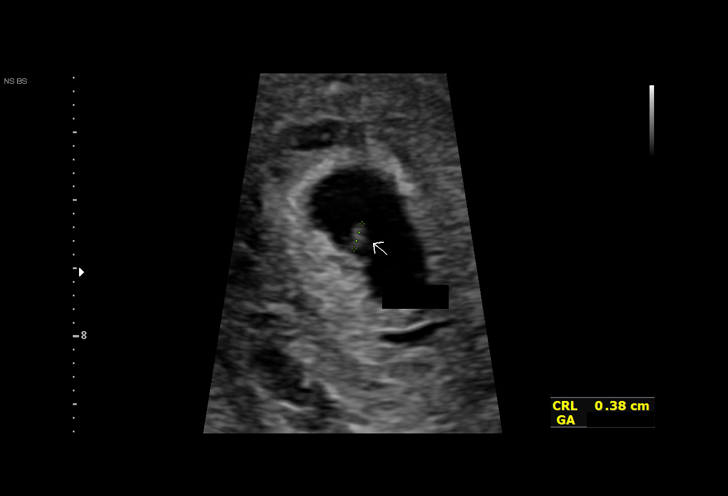
[im 33/33]
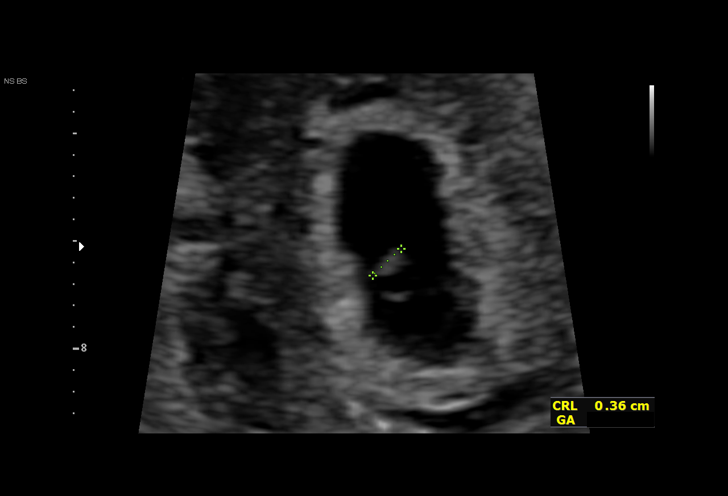

[15 of 28 positions shown; findings below may reference images not displayed]

FINDINGS: Intrauterine gestational sac: Single

Yolk sac:  Visualized.

Embryo:  Visualized.

Cardiac Activity: Visualized.

Heart Rate: 114 bpm

CRL:  3.6 mm   6 w   0 d                  US EDC: 04/04/2022

Subchorionic hemorrhage:  None visualized.

Maternal uterus/adnexae: Right ovary is within normal limits.

Left ovary is notable for a 5.6 x 5.0 x 5.2 cm simple cyst, likely
physiologic.

No pelvic ascites.
IMPRESSION: Single intrauterine gestation with cardiac activity, measuring 6
weeks 0 days by crown lump length, as above.

## 2023-07-21 ENCOUNTER — Ambulatory Visit: Payer: Medicaid Other | Admitting: Plastic Surgery

## 2023-07-21 ENCOUNTER — Encounter: Payer: Self-pay | Admitting: Plastic Surgery

## 2023-07-21 VITALS — BP 126/80 | HR 69 | Ht 66.0 in | Wt 296.2 lb

## 2023-07-21 DIAGNOSIS — Z6841 Body Mass Index (BMI) 40.0 and over, adult: Secondary | ICD-10-CM

## 2023-07-21 DIAGNOSIS — R21 Rash and other nonspecific skin eruption: Secondary | ICD-10-CM | POA: Diagnosis not present

## 2023-07-21 DIAGNOSIS — M793 Panniculitis, unspecified: Secondary | ICD-10-CM

## 2023-07-21 DIAGNOSIS — E65 Localized adiposity: Secondary | ICD-10-CM

## 2023-07-21 NOTE — Progress Notes (Signed)
Referring Provider Donell Beers, FNP [phone] [address] [place],  [place] [address]   CC:  Chief Complaint  Patient presents with   Advice Only      Priscilla Delgado is an 42 y.o. female.  HPI: Priscilla Delgado is a 42 year old female who presents today for discussion of a panniculectomy.  Patient has been treated in the past for rashes in the intertriginous regions and she feels that the size of her pannus is contributing to her inability to perform exercises and her daily activities.  Of note the patient has a BMI of 48 which has been fairly consistent over the past year.  She endorses low back pain which is required steroid injections and worsening knee pain.  She has been seen for weight loss medications but her insurance denied her 88.  She has been to a weight loss facility in the past but states that she is unable to fill out the paperwork due to language barriers and was charged twice for not completing her paperwork.  No Known Allergies  Outpatient Encounter Medications as of 07/21/2023  Medication Sig Note   acetaminophen (TYLENOL) 500 MG tablet Take 500 mg by mouth every 6 (six) hours as needed for mild pain. 11/15/2022: Prn    ibuprofen (ADVIL) 600 MG tablet Take 1 tablet (600 mg total) by mouth every 6 (six) hours as needed.    pantoprazole (PROTONIX) 40 MG tablet Take 1 tablet (40 mg total) by mouth daily.    polyethylene glycol (MIRALAX MIX-IN PAX) 17 g packet Take 17 g by mouth daily as needed.    triamcinolone cream (KENALOG) 0.1 % Apply 1 Application topically 2 (two) times daily.    amoxicillin-clavulanate (AUGMENTIN) 875-125 MG tablet Take 1 tablet by mouth every 12 (twelve) hours. (Patient not taking: Reported on 07/21/2023)    methocarbamol (ROBAXIN) 500 MG tablet Take 1 tablet (500 mg total) by mouth 2 (two) times daily. (Patient not taking: Reported on 07/21/2023)    No facility-administered encounter medications on file as of 07/21/2023.     Past Medical  History:  Diagnosis Date   Ulcer of the stomach and intestine     Past Surgical History:  Procedure Laterality Date   CESAREAN SECTION      Family History  Problem Relation Age of Onset   ADD / ADHD Neg Hx    Diabetes Neg Hx    Hypertension Neg Hx     Social History   Social History Narrative   ** Merged History Encounter **         Review of Systems General: Denies fevers, chills, weight loss CV: Denies chest pain, shortness of breath, palpitations Abdomen: Excessive anterior abdominal wall skin and fat which she feels is contributing to her rashes and low back pain.  Physical Exam    07/21/2023    9:14 AM 03/07/2023   10:36 AM 02/27/2023   11:55 PM  Vitals with BMI  Height 5\' 6"  5\' 6"    Weight 296 lbs 3 oz 295 lbs 14 oz   BMI 47.83 47.78   Systolic 126 110 540  Diastolic 80 65 69  Pulse 69 54 66    General:  No acute distress,  Alert and oriented, Non-Toxic, Normal speech and affect Abdomen: Patient has a very large very thick dense pannus. Mammogram: Patient has not begun mammographic screening Assessment/Plan Pannus: Patient has a very large pannus which I am sure does contribute to her rash and discomfort and performing  daily activities.  Unfortunately the patient's weight makes the risk of complications prohibitively high.  I have discussed this with her at length.  I have requested that she lose closer to a BMI of 40 which would be a weight of 250 pounds for her.  I would consider panniculectomy at a slightly higher BMI as I do believe that this interferes with her activities of daily living and possibly contributes to her back pain.  I have referred her to the healthy weight and wellness program.  She will require assistance by an interpreter with any forms that she needs to fill out. I have asked her to discuss again with her primary care provider other medical options for weight loss.  I do believe that her increasing knee pain may be additional justification for  one of the medical weight loss treatments.  Additionally it may be worth considering bariatric surgery to assist her with her weight loss as her weight is clearly both a physical and a mental health issue. She will follow-up with me in 1 month.  Priscilla Delgado 07/21/2023, 9:34 AM

## 2023-08-22 ENCOUNTER — Institutional Professional Consult (permissible substitution): Payer: Medicaid Other | Admitting: Plastic Surgery

## 2023-09-29 ENCOUNTER — Other Ambulatory Visit: Payer: Self-pay

## 2023-09-29 ENCOUNTER — Encounter (HOSPITAL_COMMUNITY): Payer: Self-pay

## 2023-09-29 ENCOUNTER — Emergency Department (HOSPITAL_COMMUNITY)
Admission: EM | Admit: 2023-09-29 | Discharge: 2023-09-29 | Disposition: A | Discharge status: Home / Self Care | Payer: Medicaid Other | Attending: Emergency Medicine | Admitting: Emergency Medicine

## 2023-09-29 ENCOUNTER — Emergency Department (HOSPITAL_COMMUNITY)
Admission: EM | Admit: 2023-09-29 | Discharge: 2023-09-29 | Disposition: A | Discharge status: Home / Self Care | Payer: Medicaid Other

## 2023-09-29 DIAGNOSIS — M542 Cervicalgia: Secondary | ICD-10-CM | POA: Insufficient documentation

## 2023-09-29 MED ORDER — CYCLOBENZAPRINE HCL 10 MG PO TABS: 10.0000 mg | ORAL_TABLET | Freq: Two times a day (BID) | ORAL | 0 refills | Status: DC | PRN

## 2023-09-29 MED ORDER — KETOROLAC TROMETHAMINE 15 MG/ML IJ SOLN
15.0000 mg | Freq: Once | INTRAMUSCULAR | Status: AC
  Administered 2023-09-29: 15 mg via INTRAMUSCULAR
  Filled 2023-09-29: qty 1

## 2023-09-29 NOTE — ED Provider Notes (Signed)
[facility] AT [facility] Provider Note   CSN: [id number] Arrival date & time: 09/29/23  1118     History Chief Complaint  Patient presents with   Neck Pain    Priscilla Delgado is a 42 y.o. female patient with no significant past medical history who presents to the emergency department today for further evaluation of neck pain.  Pain has been present for 1 week.  Patient does recall may be sleeping funny prior to this starting.  She denies any focal weakness to her arms or hands.  She denies any numbness.  Denies any fever, drug use, alcohol use, chills, chest pain, headache, shortness of breath.   Neck Pain      Home Medications Prior to Admission medications   Medication Sig Start Date End Date Taking? Authorizing Provider  cyclobenzaprine (FLEXERIL) 10 MG tablet Take 1 tablet (10 mg total) by mouth 2 (two) times daily as needed for muscle spasms. 09/29/23  Yes Meredeth Ide, Ancel Easler M, PA-C  acetaminophen (TYLENOL) 500 MG tablet Take 500 mg by mouth every 6 (six) hours as needed for mild pain.    [provider]  amoxicillin-clavulanate (AUGMENTIN) 875-125 MG tablet Take 1 tablet by mouth every 12 (twelve) hours. Patient not taking: Reported on 07/21/2023 01/21/23   Mesner, Barbara Cower, MD  ibuprofen (ADVIL) 600 MG tablet Take 1 tablet (600 mg total) by mouth every 6 (six) hours as needed. 03/07/23   Brand Males, CNM  methocarbamol (ROBAXIN) 500 MG tablet Take 1 tablet (500 mg total) by mouth 2 (two) times daily. Patient not taking: Reported on 07/21/2023 02/28/23   Marita Kansas, PA-C  pantoprazole (PROTONIX) 40 MG tablet Take 1 tablet (40 mg total) by mouth daily. 02/28/23   Karie Mainland, Amjad, PA-C  polyethylene glycol (MIRALAX MIX-IN PAX) 17 g packet Take 17 g by mouth daily as needed. 02/22/23   Donell Beers, FNP  triamcinolone cream (KENALOG) 0.1 % Apply 1 Application topically 2 (two) times daily. 02/22/23   Donell Beers, FNP      Allergies     Patient has no known allergies.    Review of Systems   Review of Systems  Musculoskeletal:  Positive for neck pain.  All other systems reviewed and are negative.   Physical Exam Updated Vital Signs BP 130/81 (BP Location: Right Arm)   Pulse 68   Temp 97.8 F (36.6 C)   Resp 16   LMP 09/14/2023   SpO2 100%  Physical Exam Vitals and nursing note reviewed.  Constitutional:      General: She is not in acute distress.    Appearance: Normal appearance.  HENT:     Head: Normocephalic and atraumatic.  Eyes:     General:        Right eye: No discharge.        Left eye: No discharge.  Neck:     Comments: Patient does have full range of motion of the neck although it is painful.  She has no tenderness down the midline cervical spine or upper T-spine.  No meningismus.  There is palpable spasm and tenderness to the paracervical musculature. Cardiovascular:     Comments: Regular rate and rhythm.  S1/S2 are distinct without any evidence of murmur, rubs, or gallops.  Radial pulses are 2+ bilaterally.  Dorsalis pedis pulses are 2+ bilaterally.  No evidence of pedal edema. Pulmonary:     Comments: Clear to auscultation bilaterally.  Normal effort.  No respiratory distress.  No evidence of wheezes, rales, or rhonchi heard throughout. Abdominal:     General: Abdomen is flat. Bowel sounds are normal. There is no distension.     Tenderness: There is no abdominal tenderness. There is no guarding or rebound.  Musculoskeletal:        General: Normal range of motion.     Cervical back: Neck supple.     Comments: Full range of motion in the upper extremities.  Skin:    General: Skin is warm and dry.     Findings: No rash.  Neurological:     General: No focal deficit present.     Mental Status: She is alert.     Comments: 5/5 strength to the upper extremities.  Normal sensation to the upper extremities.  Psychiatric:        Mood and Affect: Mood normal.        Behavior: Behavior normal.      ED Results / Procedures / Treatments   Labs (all labs ordered are listed, but only abnormal results are displayed) Labs Reviewed - No data to display  EKG None  Radiology No results found.  Procedures Procedures    Medications Ordered in ED Medications  ketorolac (TORADOL) 15 MG/ML injection 15 mg (has no administration in time range)    ED Course/ Medical Decision Making/ A&P   {   Click here for ABCD2, HEART and other calculators  Medical Decision Making Priscilla Delgado is a 42 y.o. female patient who presents to the emergency department today for further evaluation of neck pain.  There is no evidence to suggest abscess, infection, meningitis, sepsis, or any bony pathology.  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  She does have full range of motion in the neck although it is somewhat painful with certain positional movements.  I will will plan to give her some Toradol here and some Flexeril to go home with.  Patient is currently taking Tylenol.  I do see in her chart that she does have a history of ulcers so I will hold off on other ibuprofen right now.  Strict return precautions were discussed.  She would follow-up with her primary care doctor for further evaluation.   Risk Prescription drug management.    Final Clinical Impression(s) / ED Diagnoses Final diagnoses:  Neck pain    Rx / DC Orders ED Discharge Orders          Ordered    cyclobenzaprine (FLEXERIL) 10 MG tablet  2 times daily PRN        09/29/23 1248              Honor Loh [place], [place] 09/29/23 1255    Rondel Baton, MD 10/01/23 [phone]

## 2023-09-29 NOTE — ED Triage Notes (Signed)
  Pt. Stated, Ive had neck and left shoulder back into my back for almost a week

## 2023-09-29 NOTE — ED Triage Notes (Addendum)
Pt. Stated, Ive had neck and left shoulder back into my back for almost a week.

## 2023-09-29 NOTE — Discharge Instructions (Signed)
You can continue taking Tylenol in addition to Flexeril which I prescribed today.  Please take as prescribed.  Please do not mix with alcohol.  Be careful when driving heavy machinery as this can make you drowsy.  I would like for you to follow-up with your primary care doctor in 1 week to ensure we have good follow-up.  You may return to the emergency department for any worsening symptoms.

## 2023-09-29 NOTE — ED Notes (Signed)
Pt was mistakenly registered as someone else problem has been corrected.

## 2023-11-01 ENCOUNTER — Ambulatory Visit: Payer: Self-pay | Admitting: Nurse Practitioner

## 2023-11-02 ENCOUNTER — Encounter: Payer: Self-pay | Admitting: Nurse Practitioner

## 2023-11-02 ENCOUNTER — Ambulatory Visit (INDEPENDENT_AMBULATORY_CARE_PROVIDER_SITE_OTHER): Payer: Self-pay | Admitting: Nurse Practitioner

## 2023-11-02 VITALS — BP 105/69 | HR 64 | Temp 97.2°F | Wt 304.0 lb

## 2023-11-02 DIAGNOSIS — Z1231 Encounter for screening mammogram for malignant neoplasm of breast: Secondary | ICD-10-CM | POA: Diagnosis not present

## 2023-11-02 DIAGNOSIS — Z130 Encounter for screening for diseases of the blood and blood-forming organs and certain disorders involving the immune mechanism: Secondary | ICD-10-CM | POA: Insufficient documentation

## 2023-11-02 DIAGNOSIS — Z6841 Body Mass Index (BMI) 40.0 and over, adult: Secondary | ICD-10-CM

## 2023-11-02 DIAGNOSIS — H547 Unspecified visual loss: Secondary | ICD-10-CM | POA: Diagnosis not present

## 2023-11-02 MED ORDER — PHENTERMINE HCL 15 MG PO CAPS: 15.0000 mg | ORAL_CAPSULE | ORAL | 0 refills | Status: DC

## 2023-11-02 NOTE — Assessment & Plan Note (Deleted)
No results found.

## 2023-11-02 NOTE — Progress Notes (Signed)
 Established Patient Office Visit  Subjective:  Patient ID: Priscilla Delgado, female    DOB: May 13, 1981  Age: 43 y.o. MRN: [id number]  CC:  Chief Complaint  Patient presents with   Obesity    HPI Priscilla Delgado is a 43 y.o. female  has a past medical history of BMI 50.0-59.9, adult (HCC) (11/26/2019), Subclinical hypothyroidism (11/28/2019), and Ulcer of the stomach and intestine.  Patient presents for follow-up for obesity   Obesity.  Patient stated that she has been doing some walking exercises on some days also doing portion control land  avoiding rice, bread.  Stated that She is aware of the benefits of healthy weight, she is interested in medication to assist with losing weight and also bariatric surgery.  Never been on medication for weight loss, her insurance denied wegovy  in the past.  She was considering panniculectomy but she is more interested in bariatric surgery at this time.      Wt Readings from Last 3 Encounters:  11/02/23 (!) 304 lb (137.9 kg)  07/21/23 296 lb 3.2 oz (134.4 kg)  03/07/23 295 lb 14.4 oz (134.2 kg)     Past Medical History:  Diagnosis Date   BMI 50.0-59.9, adult (HCC) 11/26/2019   Subclinical hypothyroidism 11/28/2019   01/07/2020 - TSH elevated (5.1), free T4 normal (0.86)  Recheck in 4 weeks  03/10/2020 - TSH normal (2.99), free T4 normal (0.89)     Ulcer of the stomach and intestine     Past Surgical History:  Procedure Laterality Date   CESAREAN SECTION      Family History  Problem Relation Age of Onset   ADD / ADHD Neg Hx    Diabetes Neg Hx    Hypertension Neg Hx     Social History   Socioeconomic History   Marital status: Single    Spouse name: Not on file   Number of children: Not on file   Years of education: Not on file   Highest education level: Not on file  Occupational History   Not on file  Tobacco Use   Smoking status: Never   Smokeless tobacco: Never  Vaping Use   Vaping status: Never Used  Substance and Sexual  Activity   Alcohol use: Yes    Alcohol/week: 1.0 standard drink of alcohol    Types: 1 Glasses of wine per week    Comment: sometimes on weekends   Drug use: Never   Sexual activity: Yes    Birth control/protection: None  Other Topics Concern   Not on file  Social History Narrative   ** Merged History Encounter **       Social Drivers of Health   Financial Resource Strain: Not on file  Food Insecurity: No Food Insecurity (11/26/2021)   Hunger Vital Sign    Worried About Running Out of Food in the Last Year: Never true    Ran Out of Food in the Last Year: Never true  Transportation Needs: No Transportation Needs (11/26/2021)   PRAPARE - Administrator, Civil Service (Medical): No    Lack of Transportation (Non-Medical): No  Physical Activity: Not on file  Stress: Not on file  Social Connections: Not on file  Intimate Partner Violence: Not on file    Outpatient Medications Prior to Visit  Medication Sig Dispense Refill   acetaminophen  (TYLENOL ) 500 MG tablet Take 500 mg by mouth every 6 (six) hours as needed for mild pain.     cyclobenzaprine  (FLEXERIL ) 10 MG  tablet Take 1 tablet (10 mg total) by mouth 2 (two) times daily as needed for muscle spasms. 20 tablet 0   ibuprofen  (ADVIL ) 600 MG tablet Take 1 tablet (600 mg total) by mouth every 6 (six) hours as needed. 30 tablet 1   pantoprazole  (PROTONIX ) 40 MG tablet Take 1 tablet (40 mg total) by mouth daily. 30 tablet 0   polyethylene glycol (MIRALAX  MIX-IN PAX) 17 g packet Take 17 g by mouth daily as needed. 14 each 0   triamcinolone  cream (KENALOG ) 0.1 % Apply 1 Application topically 2 (two) times daily. 30 g 0   methocarbamol  (ROBAXIN ) 500 MG tablet Take 1 tablet (500 mg total) by mouth 2 (two) times daily. (Patient not taking: Reported on 11/02/2023) 20 tablet 0   amoxicillin -clavulanate (AUGMENTIN ) 875-125 MG tablet Take 1 tablet by mouth every 12 (twelve) hours. (Patient not taking: Reported on 11/02/2023) 14 tablet 0    No facility-administered medications prior to visit.    No Known Allergies  ROS Review of Systems  Constitutional:  Negative for appetite change, chills, fatigue and fever.  HENT:  Negative for congestion, postnasal drip, rhinorrhea and sneezing.   Eyes:  Positive for visual disturbance. Negative for pain, discharge and itching.  Respiratory:  Negative for cough, shortness of breath and wheezing.   Cardiovascular:  Negative for chest pain, palpitations and leg swelling.  Gastrointestinal:  Negative for abdominal pain, constipation, nausea and vomiting.  Genitourinary:  Negative for difficulty urinating, dysuria, flank pain and frequency.  Musculoskeletal:  Negative for arthralgias, back pain, joint swelling and myalgias.  Skin:  Negative for color change, pallor, rash and wound.  Neurological:  Negative for dizziness, facial asymmetry, weakness, numbness and headaches.  Psychiatric/Behavioral:  Negative for behavioral problems, confusion, self-injury and suicidal ideas.       Objective:    Physical Exam Vitals and nursing note reviewed.  Constitutional:      General: She is not in acute distress.    Appearance: Normal appearance. She is obese. She is not ill-appearing, toxic-appearing or diaphoretic.  HENT:     Mouth/Throat:     Mouth: Mucous membranes are moist.     Pharynx: Oropharynx is clear. No oropharyngeal exudate or posterior oropharyngeal erythema.  Eyes:     General: No scleral icterus.       Right eye: No discharge.        Left eye: No discharge.     Extraocular Movements: Extraocular movements intact.     Conjunctiva/sclera: Conjunctivae normal.  Cardiovascular:     Rate and Rhythm: Normal rate and regular rhythm.     Pulses: Normal pulses.     Heart sounds: Normal heart sounds. No murmur heard.    No friction rub. No gallop.  Pulmonary:     Effort: Pulmonary effort is normal. No respiratory distress.     Breath sounds: Normal breath sounds. No stridor. No  wheezing, rhonchi or rales.  Chest:     Chest wall: No tenderness.  Abdominal:     General: There is no distension.     Palpations: Abdomen is soft.     Tenderness: There is no abdominal tenderness. There is no right CVA tenderness, left CVA tenderness or guarding.  Musculoskeletal:        General: No swelling, tenderness, deformity or signs of injury.     Right lower leg: No edema.     Left lower leg: No edema.  Skin:    General: Skin is warm and dry.  Capillary Refill: Capillary refill takes less than 2 seconds.     Coloration: Skin is not jaundiced or pale.     Findings: No bruising, erythema or lesion.  Neurological:     Mental Status: She is alert and oriented to person, place, and time.     Motor: No weakness.     Coordination: Coordination normal.     Gait: Gait normal.  Psychiatric:        Mood and Affect: Mood normal.        Behavior: Behavior normal.        Thought Content: Thought content normal.        Judgment: Judgment normal.     BP 105/69   Pulse 64   Temp (!) 97.2 F (36.2 C)   Wt (!) 304 lb (137.9 kg)   SpO2 100%   BMI 49.07 kg/m  Wt Readings from Last 3 Encounters:  11/02/23 (!) 304 lb (137.9 kg)  07/21/23 296 lb 3.2 oz (134.4 kg)  03/07/23 295 lb 14.4 oz (134.2 kg)    Lab Results  Component Value Date   TSH 3.180 03/07/2023   Lab Results  Component Value Date   WBC 5.2 03/07/2023   HGB 12.0 03/07/2023   HCT 36.9 03/07/2023   MCV 88 03/07/2023   PLT 258 03/07/2023   Lab Results  Component Value Date   NA 133 (L) 01/20/2023   K 3.8 01/20/2023   CO2 22 01/20/2023   GLUCOSE 101 (H) 01/20/2023   BUN 9 01/20/2023   CREATININE 0.91 01/20/2023   BILITOT 0.8 01/20/2023   ALKPHOS 70 01/20/2023   AST 19 01/20/2023   ALT 15 01/20/2023   PROT 6.9 01/20/2023   ALBUMIN 3.9 01/20/2023   CALCIUM 8.9 01/20/2023   ANIONGAP 9 01/20/2023   Lab Results  Component Value Date   CHOL 152 12/15/2016   Lab Results  Component Value Date   HDL  50 (L) 12/15/2016   Lab Results  Component Value Date   LDLCALC 91 12/15/2016   Lab Results  Component Value Date   TRIG 56 12/15/2016   Lab Results  Component Value Date   CHOLHDL 3.0 12/15/2016   Lab Results  Component Value Date   HGBA1C 5.5 03/07/2023      Assessment & Plan:   Problem List Items Addressed This Visit       Other   BMI 50.0-59.9, adult (HCC) - Primary   Wt Readings from Last 3 Encounters:  11/02/23 (!) 304 lb (137.9 kg)  07/21/23 296 lb 3.2 oz (134.4 kg)  03/07/23 295 lb 14.4 oz (134.2 kg)  Start phentermine  15 mg daily for 30 days and increase to 37.5 mg daily after 30 days if well-tolerated.  Common side effects of medication including palpitation, hypertension discussed. Patient counseled on low-carb diet Encouraged engage in regular moderate to vigorous exercises at least 150 minutes weekly Patient referred to bariatric surgery Benefits of healthy weights discussed Follow-up in 4 weeks      Relevant Medications   phentermine  15 MG capsule   Other Relevant Orders   Amb Referral to Bariatric Surgery   Impaired vision   Patient referred to ophthalmology       Relevant Orders   Ambulatory referral to Ophthalmology   Screening mammogram for breast cancer   Relevant Orders   MM Digital Screening    Meds ordered this encounter  Medications   phentermine  15 MG capsule    Sig: Take 1 capsule (15 mg  total) by mouth every morning.    Dispense:  30 capsule    Refill:  0    Follow-up: Return in about 4 weeks (around 11/30/2023) for obesity.    Gagan Dillion R Ayinde Swim, FNP

## 2023-11-02 NOTE — Patient Instructions (Signed)
 1. BMI 50.0-59.9, adult (HCC) (Primary)  - Amb Referral to Bariatric Surgery - phentermine  15 MG capsule; Take 1 capsule (15 mg total) by mouth every morning.  Dispense: 30 capsule; Refill: 0  2. Screening mammogram for breast cancer  - MM Digital Screening; Future Please call [phone]   to schedule your mammogram.  [facility]. [address] [place], [place] [address]. [place] .    3. Impaired vision  - Ambulatory referral to Ophthalmology   It is important that you exercise regularly at least 30 minutes 5 times a week as tolerated  Think about what you will eat, plan ahead. Choose  clean, green, fresh or frozen over canned, processed or packaged foods which are more sugary, salty and fatty. 70 to 75% of food eaten should be vegetables and fruit. Three meals at set times with snacks allowed between meals, but they must be fruit or vegetables. Aim to eat over a 12 hour period , example 7 am to 7 pm, and STOP after  your last meal of the day. Drink water,generally about 64 ounces per day, no other drink is as healthy. Fruit juice is best enjoyed in a healthy way, by EATING the fruit.  Thanks for choosing [facility] we consider it a privelige to serve you.

## 2023-11-02 NOTE — Assessment & Plan Note (Signed)
Patient referred to ophthalmology

## 2023-11-02 NOTE — Assessment & Plan Note (Signed)
 Wt Readings from Last 3 Encounters:  11/02/23 (!) 304 lb (137.9 kg)  07/21/23 296 lb 3.2 oz (134.4 kg)  03/07/23 295 lb 14.4 oz (134.2 kg)  Start phentermine  15 mg daily for 30 days and increase to 37.5 mg daily after 30 days if well-tolerated.  Common side effects of medication including palpitation, hypertension discussed. Patient counseled on low-carb diet Encouraged engage in regular moderate to vigorous exercises at least 150 minutes weekly Patient referred to bariatric surgery Benefits of healthy weights discussed Follow-up in 4 weeks

## 2023-11-30 ENCOUNTER — Ambulatory Visit: Payer: Self-pay | Admitting: Nurse Practitioner

## 2023-12-06 ENCOUNTER — Ambulatory Visit: Payer: Medicaid Other | Admitting: Dermatology

## 2024-01-13 IMAGING — CT CT ANGIO CHEST
2 of 7 series · 18 of 46 positions shown · IV contrast (APPLIED)
Comparison: None Available.

CLINICAL DATA: High probability for acute pulmonary embolism.

EXAM:
CT ANGIOGRAPHY CHEST WITH CONTRAST
TECHNIQUE: Multidetector CT imaging of the chest was performed using the
standard protocol during bolus administration of intravenous
contrast. Multiplanar CT image reconstructions and MIPs were
obtained to evaluate the vascular anatomy.

[Series 6: thins · axial · 0.95mm/px · z∈[+1289,+1553]mm · 15 of 296 slices shown]
[im 16/296  lung]
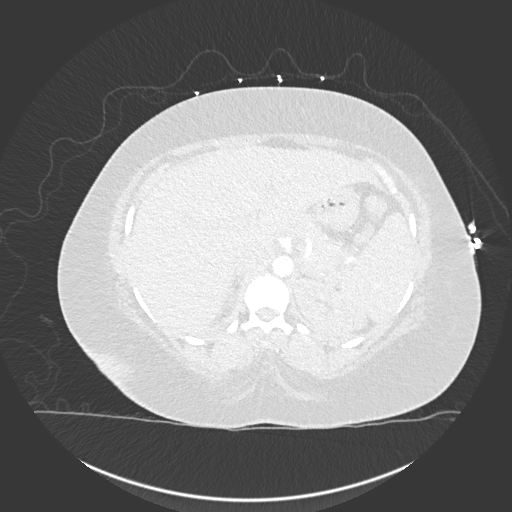
[im 32/296  soft-tissue]
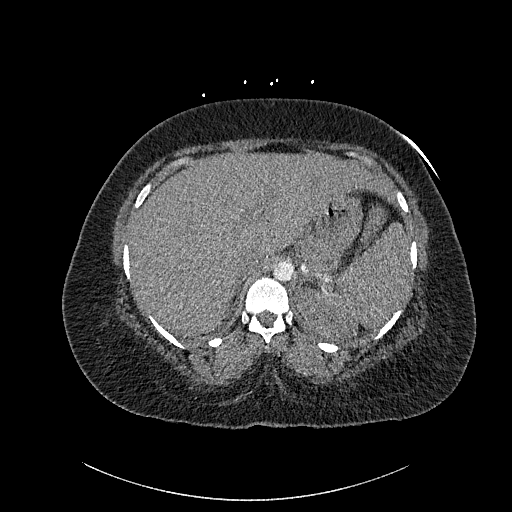
[im 63/296  lung]
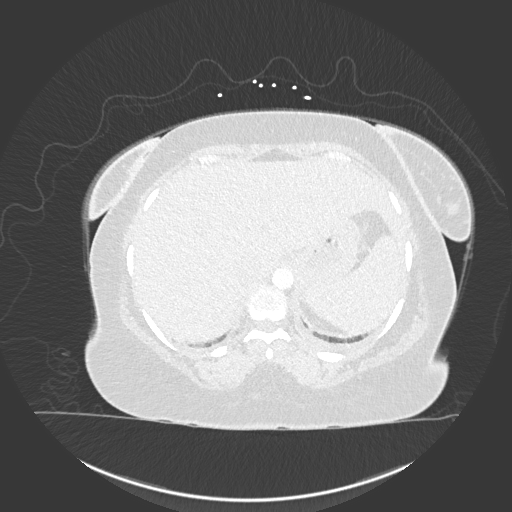
[im 78/296  soft-tissue]
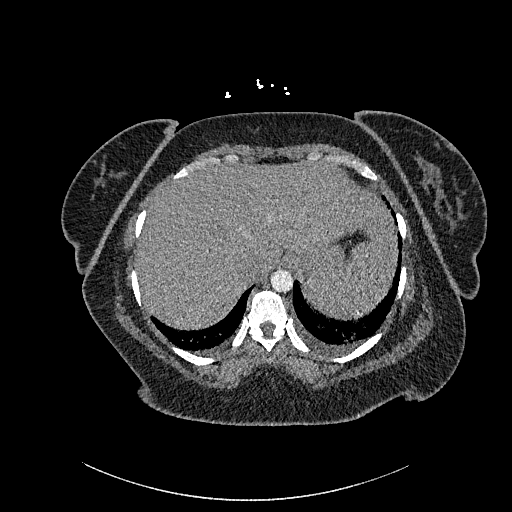
[im 94/296  lung]
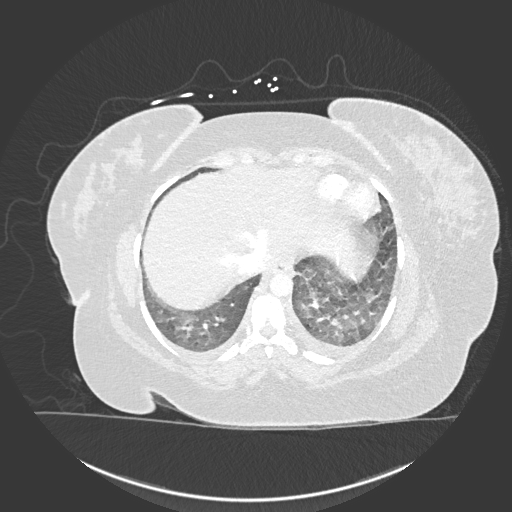
[im 109/296  soft-tissue]
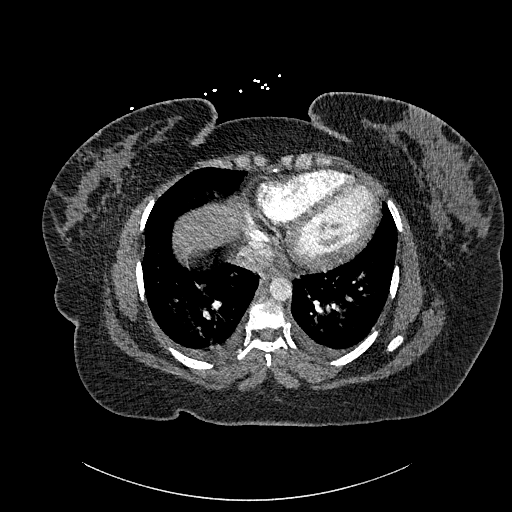
[im 125/296  lung]
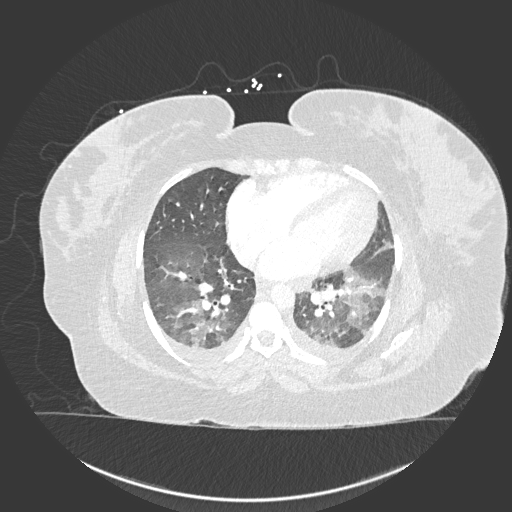
[im 156/296  soft-tissue]
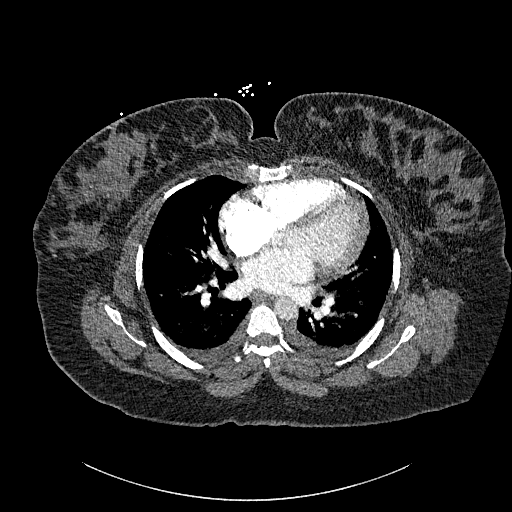
[im 171/296  lung]
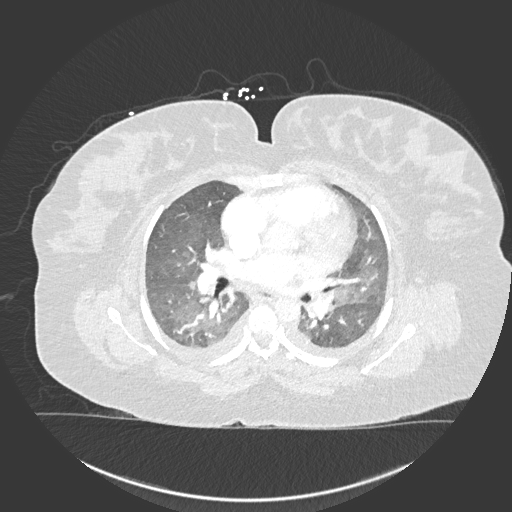
[im 187/296  soft-tissue]
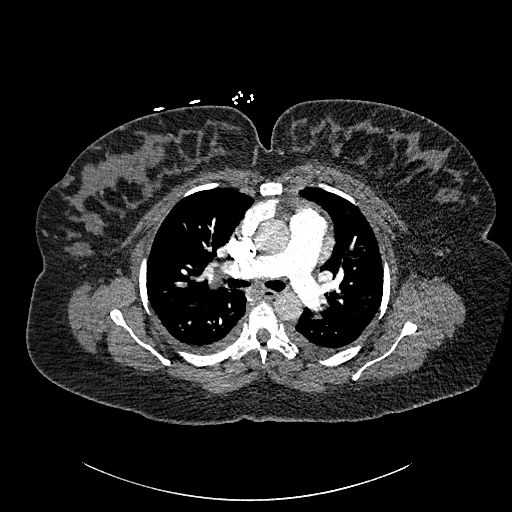
[im 202/296  lung]
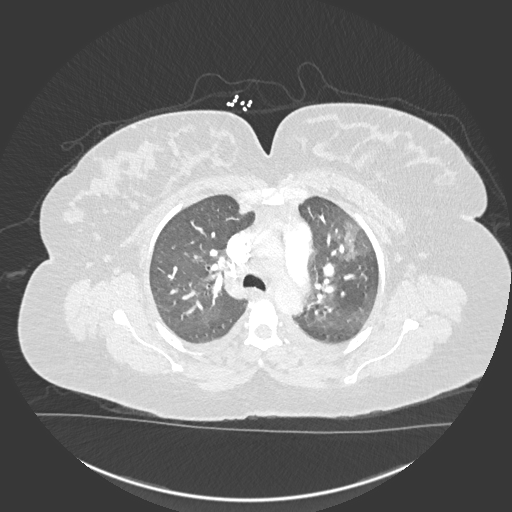
[im 218/296  soft-tissue]
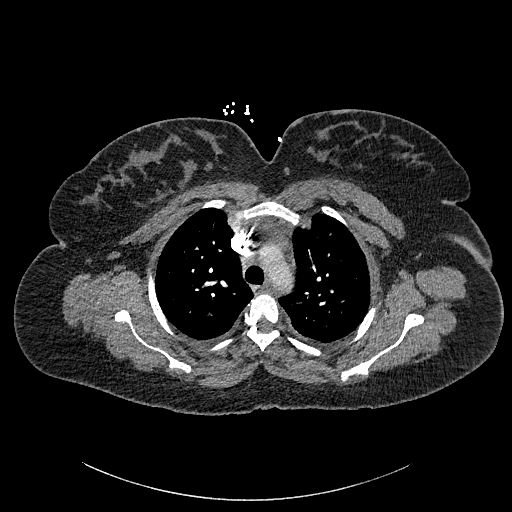
[im 249/296  lung]
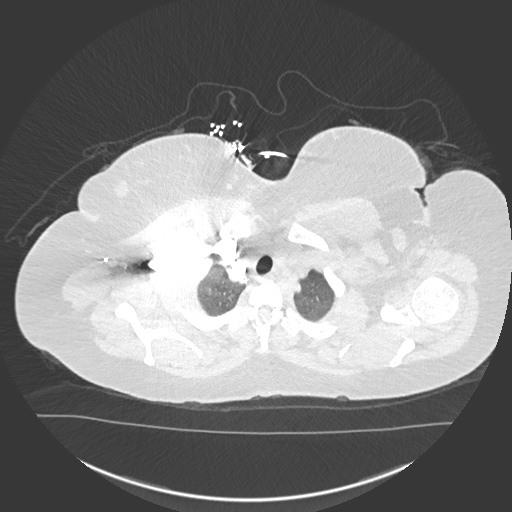
[im 264/296  soft-tissue]
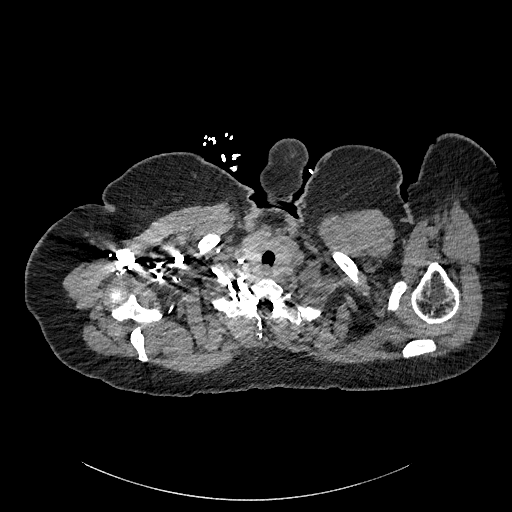
[im 280/296  lung]
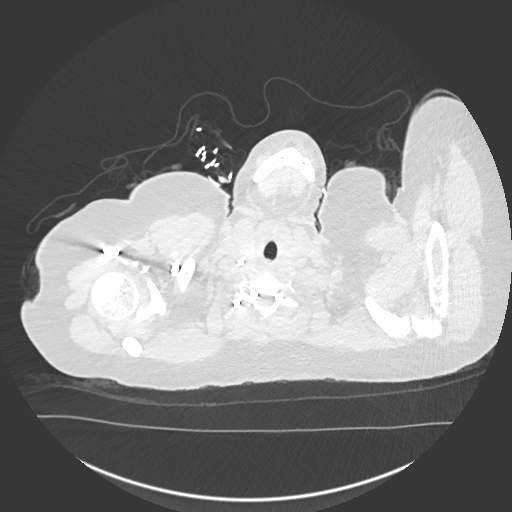

[Series 8: coronal mpr · coronal · 0.65mm/px · 3 of 150 slices shown]
[im 38/150  soft-tissue]
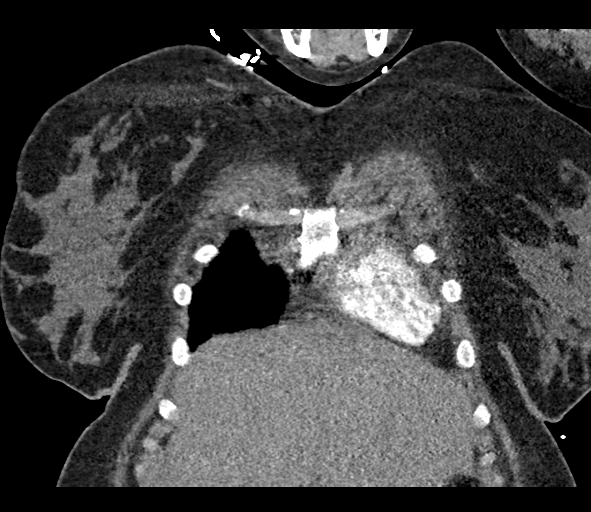
[im 75/150  soft-tissue]
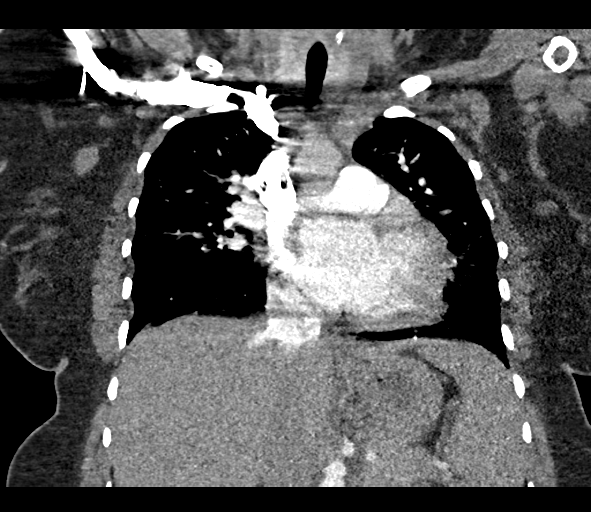
[im 112/150  soft-tissue]
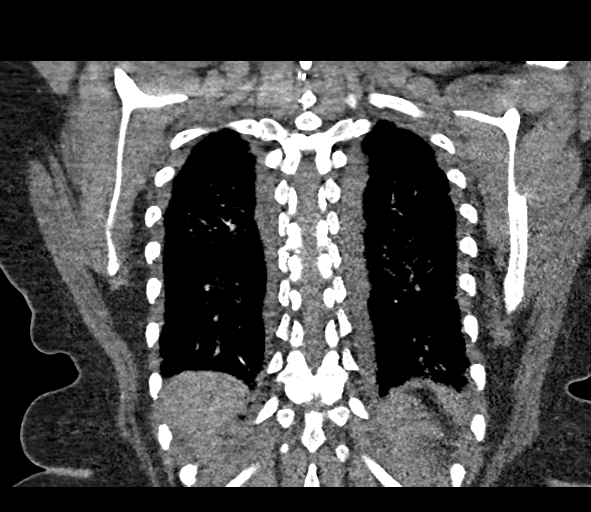

[18 of 46 positions shown; findings below may reference images not displayed]

RADIATION DOSE REDUCTION: This exam was performed according to the
departmental dose-optimization program which includes automated
exposure control, adjustment of the mA and/or kV according to
patient size and/or use of iterative reconstruction technique.

CONTRAST:  100mL OMNIPAQUE IOHEXOL 350 MG/ML SOLN
FINDINGS: Cardiovascular: Diminished exam detail due to respiratory motion
artifact and sub optimal pulmonary arterial opacification. Within
this limitation there is no sign of lobar or segmental pulmonary
artery filling defects.

Mild cardiac enlargement without pericardial effusion.

Mediastinum/Nodes: No enlarged mediastinal, hilar, or axillary lymph
nodes. Thyroid gland, trachea, and esophagus demonstrate no
significant findings.

Lungs/Pleura: There are small bilateral pleural effusions.
Multifocal bilateral areas of ground-glass attenuation are seen
within the left upper lobe and both lower lobes. No airspace
consolidation or atelectasis. No signs of pneumothorax.

Upper Abdomen: No acute abnormality. Reflux of contrast material is
identified into the IVC and hepatic veins.

Musculoskeletal: No chest wall abnormality. No acute or significant
osseous findings.

Review of the MIP images confirms the above findings.
IMPRESSION: 1. Diminished exam detail due to respiratory motion artifact and sub
optimal pulmonary arterial opacification. Within this limitation
there is no evidence for lobar or segmental pulmonary artery filling
defects to suggest a clinically significant pulmonary embolus.
2. Small bilateral pleural effusions.
3. Multifocal bilateral areas of ground-glass attenuation are noted
within the left upper lobe and both lower lobes. Findings may
reflect pulmonary edema or infection.
4. Reflux of contrast material into the IVC and hepatic veins.
Although nonspecific this may be seen in the setting of right heart
failure.

## 2024-07-09 ENCOUNTER — Ambulatory Visit (HOSPITAL_COMMUNITY)
Admission: EM | Admit: 2024-07-09 | Discharge: 2024-07-09 | Disposition: A | Discharge status: Home / Self Care | Attending: Emergency Medicine | Admitting: Emergency Medicine

## 2024-07-09 ENCOUNTER — Encounter (HOSPITAL_COMMUNITY): Payer: Self-pay

## 2024-07-09 DIAGNOSIS — R52 Pain, unspecified: Secondary | ICD-10-CM | POA: Insufficient documentation

## 2024-07-09 LAB — POCT URINALYSIS DIP (MANUAL ENTRY)
Bilirubin, UA: NEGATIVE
Glucose, UA: NEGATIVE mg/dL
Ketones, POC UA: NEGATIVE mg/dL
Leukocytes, UA: NEGATIVE
Nitrite, UA: NEGATIVE
Protein Ur, POC: NEGATIVE mg/dL
Spec Grav, UA: 1.02 (ref 1.010–1.025)
Urobilinogen, UA: 0.2 U/dL
pH, UA: 7 (ref 5.0–8.0)

## 2024-07-09 MED ORDER — ACETAMINOPHEN 325 MG PO TABS
975.0000 mg | ORAL_TABLET | Freq: Once | ORAL | Status: AC
  Administered 2024-07-09: 975 mg via ORAL

## 2024-07-09 MED ORDER — CYCLOBENZAPRINE HCL 5 MG PO TABS: 5.0000 mg | ORAL_TABLET | Freq: Every evening | ORAL | 0 refills | Status: AC

## 2024-07-09 MED ORDER — ACETAMINOPHEN 325 MG PO TABS
ORAL_TABLET | ORAL | Status: AC
  Filled 2024-07-09: qty 3

## 2024-07-09 NOTE — Discharge Instructions (Addendum)
 The urine sample has blood in it, which may be from your menstrual cycle. We will culture the urine to make sure there is no infection. This may take 2-3 days.  As discussed I think you have a viral illness, from recent travel and being around large groups of people.   You can take the muscle relaxer Flexeril  twice daily. If the medication makes you drowsy, take only at bed time.  Tylenol  can be used for body aches and cramps  You can also use ibuprofen  if you take it with food 600 mg every 6 hours as needed If you have the 200 mg tablets at home, you can take 3 of them together  Please call your primary care provider to make a follow up appointment

## 2024-07-09 NOTE — ED Provider Notes (Addendum)
 [facility]    CSN: [id number] Arrival date & time: 07/09/24  0912      History   Chief Complaint Chief Complaint  Patient presents with   Generalized Body Aches    HPI Priscilla Delgado is a 43 y.o. female.  5 days of body aches and fatigue Symptoms began after traveling back from [place] they seemed to increase, she has been around large groups of people the last few days.  Nausea, no vomiting or diarrhea. Not having any fever, chills, sweats.   She started her menstrual cycle today, not sure if symptoms are related to this Not having urinary symptoms, abdominal pain  Has not attempted any interventions yet   Past Medical History:  Diagnosis Date   BMI 50.0-59.9, adult (HCC) 11/26/2019   Subclinical hypothyroidism 11/28/2019   01/07/2020 - TSH elevated (5.1), free T4 normal (0.86)  Recheck in 4 weeks  03/10/2020 - TSH normal (2.99), free T4 normal (0.89)     Ulcer of the stomach and intestine     Patient Active Problem List   Diagnosis Date Noted   Impaired vision 11/02/2023   Screening mammogram for breast cancer 11/02/2023   Right arm pain 02/22/2023   Rash and other nonspecific skin eruption 02/22/2023   Constipation 02/22/2023   Encounter for pregnancy test, result negative 02/22/2023   Right lower quadrant abdominal pain 11/15/2022   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 Authorizing Provider  cyclobenzaprine  (FLEXERIL ) 5 MG tablet Take 1 tablet (5 mg total) by mouth at bedtime. 07/09/24  Yes Jian Hodgman, Asberry, PA-C    Family History Family History  Problem Relation Age of Onset   ADD / ADHD Neg Hx    Diabetes Neg Hx    Hypertension Neg Hx     Social History Social History   Tobacco Use   Smoking status: Never   Smokeless tobacco: Never  Vaping Use   Vaping status: Never Used  Substance Use Topics   Alcohol use: Yes    Alcohol/week: 1.0 standard drink of alcohol    Types: 1 Glasses of wine per week    Comment: sometimes on weekends   Drug use: Never     Allergies   Patient has no known allergies.   Review of Systems Review of Systems  As per HPI  Physical Exam Triage Vital Signs ED Triage Vitals  Encounter Vitals Group     BP 07/09/24 1038 107/70     Girls Systolic BP Percentile --      Girls Diastolic BP Percentile --  Boys Systolic BP Percentile --      Boys Diastolic BP Percentile --      Pulse Rate 07/09/24 1038 (!) 57     Resp 07/09/24 1038 16     Temp 07/09/24 1038 98 F (36.7 C)     Temp Source 07/09/24 1038 Oral     SpO2 07/09/24 1038 98 %     Weight --      Height --      Head Circumference --      Peak Flow --      Pain Score 07/09/24 1041 7     Pain Loc --      Pain Education --      Exclude from Growth Chart --    No data found.  Updated Vital Signs BP 107/70 (BP Location: Left Arm)   Pulse 62   Temp 98 F (36.7 C) (Oral)   Resp 16   LMP 06/15/2024 (Approximate)   SpO2 98%    Physical Exam Vitals and nursing note reviewed.  Constitutional:      Appearance: She is not ill-appearing or diaphoretic.  HENT:     Right Ear: Tympanic membrane and ear canal normal.     Left Ear: Tympanic membrane and ear canal normal.     Nose: No congestion or rhinorrhea.     Mouth/Throat:     Mouth: Mucous membranes are moist.     Pharynx: Oropharynx is  clear. No posterior oropharyngeal erythema.  Eyes:     Conjunctiva/sclera: Conjunctivae normal.  Cardiovascular:     Rate and Rhythm: Normal rate and regular rhythm.     Pulses: Normal pulses.     Heart sounds: Normal heart sounds.  Pulmonary:     Effort: Pulmonary effort is normal.     Breath sounds: Normal breath sounds.  Abdominal:     Palpations: Abdomen is soft.     Tenderness: There is no abdominal tenderness. There is no right CVA tenderness or left CVA tenderness.  Musculoskeletal:     Cervical back: Normal range of motion. No rigidity or tenderness.  Lymphadenopathy:     Cervical: No cervical adenopathy.  Skin:    General: Skin is warm and dry.  Neurological:     General: No focal deficit present.     Mental Status: She is alert and oriented to person, place, and time.     Sensory: No sensory deficit.     Motor: No weakness.     Coordination: Coordination normal.     Gait: Gait normal.     Comments: Strength and sensation intact, equal      UC Treatments / Results  Labs (all labs ordered are listed, but only abnormal results are displayed) Labs Reviewed  POCT URINALYSIS DIP (MANUAL ENTRY) - Abnormal; Notable for the following components:      Result Value   Blood, UA moderate (*)    All other components within normal limits  URINE CULTURE    EKG  Radiology No results found.  Procedures Procedures  Medications Ordered in UC Medications  acetaminophen  (TYLENOL ) tablet 975 mg (975 mg Oral Given 07/09/24 1154)    Initial Impression / Assessment and Plan / UC Course  I have reviewed the triage vital signs and the nursing notes.  Pertinent labs & imaging results that were available during my care of the patient were reviewed by me and considered in my medical decision making (see chart for details).  Afebrile. Well appearing. Stable vitals, neurologically intact  Discussed likely viral illness, recent travel and close contact with others.  She declines  my recommendation of covid/flu test Reports she had the swab 1 year ago and it hurt her nose.   UA moderate blood, may be menstrual. Will culture.  Tylenol  dose given Try muscle relaxer at home - flexeril  5 mg nightly, BID if not drowsy.  Tylenol  and ibu at home for symptoms   Advised call PCP for follow up if symptoms are persisting Otherwise allow a few days for improvement Agrees with plan   Final Clinical Impressions(s) / UC Diagnoses   Final diagnoses:  Body aches     Discharge Instructions      The urine sample has blood in it, which may be from your menstrual cycle. We will culture the urine to make sure there is no infection. This may take 2-3 days.  As discussed I think you have a viral illness, from recent travel and being around large groups of people.   You can take the muscle relaxer Flexeril  twice daily. If the medication makes you drowsy, take only at bed time.  Tylenol  can be used for body aches and cramps  You can also use ibuprofen  if you take it with food 600 mg every 6 hours as needed If you have the 200 mg tablets at home, you can take 3 of them together  Please call your primary care provider to make a follow up appointment      ED Prescriptions     Medication Sig Dispense Auth. Provider   cyclobenzaprine  (FLEXERIL ) 5 MG tablet Take 1 tablet (5 mg total) by mouth at bedtime. 30 tablet Clayton Jarmon, Asberry, PA-C      PDMP not reviewed this encounter.   Fatema Rabe, PA-C 07/09/24 1143    Kailand Seda, Asberry, PA-C 07/09/24 1218

## 2024-07-09 NOTE — ED Triage Notes (Signed)
 Patient here today with c/o body aches, fatigue, and headache X 2 weeks. Has not taken anything for her symptoms. No known sick contacts. Patient states that she also has some nausea off and on with abd pain.

## 2024-07-10 LAB — URINE CULTURE: Culture: 20000 — AB

## 2024-07-11 ENCOUNTER — Ambulatory Visit (HOSPITAL_COMMUNITY): Payer: Self-pay

## 2024-08-07 ENCOUNTER — Ambulatory Visit (INDEPENDENT_AMBULATORY_CARE_PROVIDER_SITE_OTHER): Payer: Self-pay | Admitting: Nurse Practitioner

## 2024-08-07 ENCOUNTER — Encounter: Payer: Self-pay | Admitting: Nurse Practitioner

## 2024-08-07 VITALS — BP 115/74 | HR 64 | Ht 66.0 in | Wt 299.0 lb

## 2024-08-07 DIAGNOSIS — Z6841 Body Mass Index (BMI) 40.0 and over, adult: Secondary | ICD-10-CM | POA: Diagnosis not present

## 2024-08-07 DIAGNOSIS — Z0000 Encounter for general adult medical examination without abnormal findings: Secondary | ICD-10-CM | POA: Insufficient documentation

## 2024-08-07 DIAGNOSIS — K921 Melena: Secondary | ICD-10-CM | POA: Insufficient documentation

## 2024-08-07 DIAGNOSIS — R1310 Dysphagia, unspecified: Secondary | ICD-10-CM | POA: Insufficient documentation

## 2024-08-07 DIAGNOSIS — Z1329 Encounter for screening for other suspected endocrine disorder: Secondary | ICD-10-CM

## 2024-08-07 DIAGNOSIS — K5900 Constipation, unspecified: Secondary | ICD-10-CM

## 2024-08-07 DIAGNOSIS — Z1321 Encounter for screening for nutritional disorder: Secondary | ICD-10-CM

## 2024-08-07 DIAGNOSIS — Z130 Encounter for screening for diseases of the blood and blood-forming organs and certain disorders involving the immune mechanism: Secondary | ICD-10-CM

## 2024-08-07 DIAGNOSIS — Z13228 Encounter for screening for other metabolic disorders: Secondary | ICD-10-CM

## 2024-08-07 DIAGNOSIS — M542 Cervicalgia: Secondary | ICD-10-CM | POA: Insufficient documentation

## 2024-08-07 MED ORDER — POLYETHYLENE GLYCOL 3350 17 GM/SCOOP PO POWD: 17.0000 g | Freq: Every day | ORAL | 1 refills | Status: AC | PRN

## 2024-08-07 NOTE — Patient Instructions (Addendum)
  Bariatric surgery [address] [place] [address]  P:  [phone] F:  [phone]   Please call [phone]   to schedule your mammogram.  [facility]. [address] [place], [place] [address]. [place] .     Please call [facility] [phone] to follow-up on your referral for your difficulty swallowing and blood in the stool.   1. BMI 50.0-59.9, adult (HCC) (Primary)   2. Annual physical exam   3. Screening for endocrine, nutritional, metabolic and immunity disorder  - CBC - CMP14+EGFR - Lipid panel  4. Blood in the stool  - Ambulatory referral to Gastroenterology  5. Dysphagia, unspecified type  - Ambulatory referral to Gastroenterology  6. Constipation, unspecified constipation type  - polyethylene glycol powder (GLYCOLAX /MIRALAX ) 17 GM/SCOOP powder; Take 17 g by mouth daily as needed. Dissolve 1 capful (17g) in 4-8 ounces of liquid and take by mouth daily.  Dispense: 3350 g; Refill: 1    7. Neck pain  - DG Cervical Spine Complete; Future  Please get your x-ray done at [facility] Address: [address] [place], [place], [place] [address] Phone: [phone]    It is important that you exercise regularly at least 30 minutes 5 times a week as tolerated  Think about what you will eat, plan ahead. Choose  clean, green, fresh or frozen over canned, processed or packaged foods which are more sugary, salty and fatty. 70 to 75% of food eaten should be vegetables and fruit. Three meals at set times with snacks allowed between meals, but they must be fruit or vegetables. Aim to eat over a 12 hour period , example 7 am to 7 pm, and STOP after  your last meal of the day. Drink water,generally about 64 ounces per day, no other drink is as healthy. Fruit juice is best enjoyed in a healthy way, by EATING the fruit.  Thanks for choosing [facility] we consider it a privelige to serve you.

## 2024-08-07 NOTE — Progress Notes (Signed)
 Established Patient Office Visit  Subjective:  Patient ID: Priscilla Delgado, female    DOB: 1981/09/13  Age: 43 y.o. MRN: [id number]  CC:  Chief Complaint  Patient presents with   Medical Management of Chronic Issues    Neck/shoulder pain present a month, gerd, constipation,     HPI   Discussed the use of AI scribe software for clinical note transcription with the patient, who gave verbal consent to proceed.  History of Present Illness Priscilla Delgado is a 43 year old female  has a past medical history of BMI 50.0-59.9, adult (HCC) (11/26/2019), Subclinical hypothyroidism (11/28/2019), and Ulcer of the stomach and intestine. who presents with chest and neck pain and for a CPE   Priscilla Delgado experiences neck pain that radiates to her shoulders, and sometimes down her arms. The pain is described as muscular and has persisted for several months. Priscilla Delgado has sought care at the emergency department and urgent care, where Priscilla Delgado was prescribed medications that have not been effective. Priscilla Delgado rates her pain as 5 out of 10, occasionally reaching 8 out of 10. Ibuprofen  provides temporary relief, but the pain returns once the medication wears off. Priscilla Delgado denies shortness of breath.   Priscilla Delgado has a history of obesity with a BMI of 48.26 and reports frequent hunger leading to overeating. Priscilla Delgado has previously tried phentermine  for weight loss, which caused palpitations. Priscilla Delgado does not engage in physical activity currently.  Priscilla Delgado experiences constipation and reports blood in her stool for the past four days. Priscilla Delgado has not taken any medication for constipation. Priscilla Delgado also describes a sensation of choking when eating, which causes her to feel like Priscilla Delgado is losing her breath. No abdominal pain, nausea, or vomiting.  Priscilla Delgado sleeps well and drinks one glass of wine every night. Priscilla Delgado does not smoke or use drugs. Priscilla Delgado is married with six children, living with them while her husband is in [place].  Assessment & Plan      Past Medical History:   Diagnosis Date   BMI 50.0-59.9, adult (HCC) 11/26/2019   Subclinical hypothyroidism 11/28/2019   01/07/2020 - TSH elevated (5.1), free T4 normal (0.86)  Recheck in 4 weeks  03/10/2020 - TSH normal (2.99), free T4 normal (0.89)     Ulcer of the stomach and intestine     Past Surgical History:  Procedure Laterality Date   CESAREAN SECTION      Family History  Problem Relation Age of Onset   ADD / ADHD Neg Hx    Diabetes Neg Hx    Hypertension Neg Hx     Social History   Socioeconomic History   Marital status: Married    Spouse name: Not on file   Number of children: 6   Years of education: Not on file   Highest education level: Not on file  Occupational History   Not on file  Tobacco Use   Smoking status: Never   Smokeless tobacco: Never  Vaping Use   Vaping status: Never Used  Substance and Sexual Activity   Alcohol use: Yes    Alcohol/week: 1.0 standard drink of alcohol    Types: 1 Glasses of wine per week    Comment: sometimes on weekends   Drug use: Never   Sexual activity: Yes    Birth control/protection: None  Other Topics Concern   Not on file  Social History Narrative   Lives with her kids , husband in [place]    Social Drivers of Health   Financial Resource Strain:  Not on file  Food Insecurity: No Food Insecurity (11/26/2021)   Hunger Vital Sign    Worried About Running Out of Food in the Last Year: Never true    Ran Out of Food in the Last Year: Never true  Transportation Needs: No Transportation Needs (11/26/2021)   PRAPARE - Administrator, Civil Service (Medical): No    Lack of Transportation (Non-Medical): No  Physical Activity: Not on file  Stress: Not on file  Social Connections: Not on file  Intimate Partner Violence: Not on file    Outpatient Medications Prior to Visit  Medication Sig Dispense Refill   Acetaminophen  (TYLENOL  8 HOUR PO) Take by mouth.     cyclobenzaprine  (FLEXERIL ) 5 MG tablet Take 1 tablet (5 mg total) by mouth  at bedtime. 30 tablet 0   No facility-administered medications prior to visit.    No Known Allergies  ROS Review of Systems  Constitutional:  Negative for appetite change, chills, fatigue and fever.  HENT:  Positive for trouble swallowing. Negative for congestion, postnasal drip, rhinorrhea and sneezing.   Eyes:  Negative for pain, discharge and itching.  Respiratory:  Negative for cough, shortness of breath and wheezing.   Cardiovascular:  Negative for chest pain, palpitations and leg swelling.  Gastrointestinal:  Positive for constipation. Negative for abdominal pain, nausea and vomiting.  Endocrine: Negative for cold intolerance and heat intolerance.  Genitourinary:  Negative for difficulty urinating, dysuria, flank pain and frequency.  Musculoskeletal:  Positive for arthralgias. Negative for back pain, joint swelling and myalgias.  Skin:  Negative for color change, pallor, rash and wound.  Allergic/Immunologic: Negative for environmental allergies and food allergies.  Neurological:  Negative for dizziness, facial asymmetry, weakness, numbness and headaches.  Psychiatric/Behavioral:  Negative for behavioral problems, confusion, self-injury and suicidal ideas.       Objective:    Physical Exam Vitals and nursing note reviewed. Exam conducted with a chaperone present.  Constitutional:      General: Priscilla Delgado is not in acute distress.    Appearance: Normal appearance. Priscilla Delgado is obese. Priscilla Delgado is not ill-appearing, toxic-appearing or diaphoretic.  HENT:     Right Ear: Tympanic membrane, ear canal and external ear normal. There is no impacted cerumen.     Left Ear: Tympanic membrane, ear canal and external ear normal. There is no impacted cerumen.     Nose: Nose normal. No congestion or rhinorrhea.     Mouth/Throat:     Mouth: Mucous membranes are moist.     Pharynx: Oropharynx is clear. No oropharyngeal exudate or posterior oropharyngeal erythema.  Eyes:     General: No scleral icterus.        Right eye: No discharge.        Left eye: No discharge.     Extraocular Movements: Extraocular movements intact.     Conjunctiva/sclera: Conjunctivae normal.  Neck:     Vascular: No carotid bruit.  Cardiovascular:     Rate and Rhythm: Normal rate and regular rhythm.     Pulses: Normal pulses.     Heart sounds: Normal heart sounds. No murmur heard.    No friction rub. No gallop.  Pulmonary:     Effort: Pulmonary effort is normal. No respiratory distress.     Breath sounds: Normal breath sounds. No stridor. No wheezing, rhonchi or rales.  Chest:     Chest wall: No mass, lacerations, deformity, swelling, tenderness, crepitus or edema.  Breasts:    Tanner Score is 5.  Right: No swelling, bleeding, inverted nipple, mass, nipple discharge, skin change or tenderness.     Left: No swelling, bleeding, inverted nipple, mass, nipple discharge, skin change or tenderness.  Abdominal:     General: Bowel sounds are normal. There is no distension.     Palpations: Abdomen is soft. There is no mass.     Tenderness: There is no abdominal tenderness. There is no right CVA tenderness, left CVA tenderness, guarding or rebound.     Hernia: No hernia is present.  Musculoskeletal:        General: No swelling, tenderness, deformity or signs of injury.     Cervical back: Normal range of motion and neck supple. No rigidity or tenderness.     Right lower leg: No edema.     Left lower leg: No edema.  Lymphadenopathy:     Cervical: No cervical adenopathy.     Upper Body:     Right upper body: No supraclavicular, axillary or pectoral adenopathy.     Left upper body: No supraclavicular, axillary or pectoral adenopathy.  Skin:    General: Skin is warm and dry.     Capillary Refill: Capillary refill takes less than 2 seconds.     Coloration: Skin is not jaundiced or pale.     Findings: No bruising, erythema, lesion or rash.  Neurological:     Mental Status: Priscilla Delgado is alert and oriented to person, place,  and time.     Cranial Nerves: No cranial nerve deficit.     Motor: No weakness.     Gait: Gait normal.  Psychiatric:        Mood and Affect: Mood normal.        Behavior: Behavior normal.        Thought Content: Thought content normal.        Judgment: Judgment normal.     BP 115/74   Pulse 64   Ht 5' 6 (1.676 m)   Wt 299 lb (135.6 kg)   LMP 06/15/2024 (Approximate)   BMI 48.26 kg/m  Wt Readings from Last 3 Encounters:  08/07/24 299 lb (135.6 kg)  11/02/23 (!) 304 lb (137.9 kg)  07/21/23 296 lb 3.2 oz (134.4 kg)    Lab Results  Component Value Date   TSH 3.180 03/07/2023   Lab Results  Component Value Date   WBC 5.2 03/07/2023   HGB 12.0 03/07/2023   HCT 36.9 03/07/2023   MCV 88 03/07/2023   PLT 258 03/07/2023   Lab Results  Component Value Date   NA 133 (L) 01/20/2023   K 3.8 01/20/2023   CO2 22 01/20/2023   GLUCOSE 101 (H) 01/20/2023   BUN 9 01/20/2023   CREATININE 0.91 01/20/2023   BILITOT 0.8 01/20/2023   ALKPHOS 70 01/20/2023   AST 19 01/20/2023   ALT 15 01/20/2023   PROT 6.9 01/20/2023   ALBUMIN 3.9 01/20/2023   CALCIUM 8.9 01/20/2023   ANIONGAP 9 01/20/2023   Lab Results  Component Value Date   CHOL 152 12/15/2016   Lab Results  Component Value Date   HDL 50 (L) 12/15/2016   Lab Results  Component Value Date   LDLCALC 91 12/15/2016   Lab Results  Component Value Date   TRIG 56 12/15/2016   Lab Results  Component Value Date   CHOLHDL 3.0 12/15/2016   Lab Results  Component Value Date   HGBA1C 5.5 03/07/2023      Assessment & Plan:   Problem List Items Addressed  This Visit       Digestive   Dysphagia   Dysphagia (choking sensation when eating) Choking sensation when eating, possibly esophageal or gastric in origin. Referral to gastroenterology needed. - Refer to gastroenterologist for evaluation.       Relevant Orders   Ambulatory referral to Gastroenterology   TSH     Other   BMI 50.0-59.9, adult (HCC)   Wt  Readings from Last 3 Encounters:  08/07/24 299 lb (135.6 kg)  11/02/23 (!) 304 lb (137.9 kg)  07/21/23 296 lb 3.2 oz (134.4 kg)   Body mass index is 48.26 kg/m.   Obesity with BMI 48.26. Previous phentermine  use caused palpitations. Discussed dietary changes, exercise, and bariatric surgery. - Encourage dietary modifications focusing on vegetables and protein. - Recommend 30 minutes of moderate to vigorous exercise 5 days a week. - Refer to nutritionist. - Provide contact information for bariatric surgery consultation.       Constipation   Constipation with rectal bleeding Constipation with rectal bleeding, likely due to hemorrhoids. Discussed dietary and hydration measures. - Recommend increased intake of vegetables and hydration. - Prescribe Miralax  17 grams daily as needed. - Consider over-the-counter stool softeners if needed.       Relevant Medications   polyethylene glycol powder (GLYCOLAX /MIRALAX ) 17 GM/SCOOP powder   Screening for endocrine, nutritional, metabolic and immunity disorder   Relevant Orders   CBC   CMP14+EGFR   Lipid panel   Neck pain    Chronic pain in neck,5-8/10. Possible degenerative changes. - Order x-ray of neck and spine. Advised Tylenol  650 mg every 6 hours as needed, use of heating pad stretching exercises encouraged      Relevant Orders   DG Cervical Spine Complete   Blood in the stool   Relevant Orders   Ambulatory referral to Gastroenterology   Annual physical exam - Primary   Annual exam as documented.  Counseling done include healthy lifestyle involving committing to 150 minutes of exercise per week, heart healthy diet, and attaining healthy weight. The importance of adequate sleep also discussed.  Regular use of seat belt discussed . Changes in health habits are decided on by patient with goals and time frames set for achieving them. Immunization and cancer screening  needs are specifically addressed at this visit.     Due for  mammogram and flu vaccination. Discussed importance of screenings and vaccinations. - Order mammogram. - Recommend flu vaccination.       Meds ordered this encounter  Medications   polyethylene glycol powder (GLYCOLAX /MIRALAX ) 17 GM/SCOOP powder    Sig: Take 17 g by mouth daily as needed. Dissolve 1 capful (17g) in 4-8 ounces of liquid and take by mouth daily.    Dispense:  3350 g    Refill:  1    Follow-up: Return in about 2 months (around 10/07/2024) for neck pain.    Dilraj Killgore R Shaletta Hinostroza, FNP

## 2024-08-07 NOTE — Assessment & Plan Note (Signed)
 Constipation with rectal bleeding Constipation with rectal bleeding, likely due to hemorrhoids. Discussed dietary and hydration measures. - Recommend increased intake of vegetables and hydration. - Prescribe Miralax  17 grams daily as needed. - Consider over-the-counter stool softeners if needed.

## 2024-08-07 NOTE — Assessment & Plan Note (Addendum)
  Chronic pain in neck,5-8/10. Possible degenerative changes. - Order x-ray of neck and spine. Advised Tylenol  650 mg every 6 hours as needed, use of heating pad stretching exercises encouraged

## 2024-08-07 NOTE — Assessment & Plan Note (Signed)
 Dysphagia (choking sensation when eating) Choking sensation when eating, possibly esophageal or gastric in origin. Referral to gastroenterology needed. - Refer to gastroenterologist for evaluation.

## 2024-08-07 NOTE — Assessment & Plan Note (Signed)
 Annual exam as documented.  Counseling done include healthy lifestyle involving committing to 150 minutes of exercise per week, heart healthy diet, and attaining healthy weight. The importance of adequate sleep also discussed.  Regular use of seat belt discussed . Changes in health habits are decided on by patient with goals and time frames set for achieving them. Immunization and cancer screening  needs are specifically addressed at this visit.     Due for mammogram and flu vaccination. Discussed importance of screenings and vaccinations. - Order mammogram. - Recommend flu vaccination.

## 2024-08-07 NOTE — Assessment & Plan Note (Addendum)
 Wt Readings from Last 3 Encounters:  08/07/24 299 lb (135.6 kg)  11/02/23 (!) 304 lb (137.9 kg)  07/21/23 296 lb 3.2 oz (134.4 kg)   Body mass index is 48.26 kg/m.   Obesity with BMI 48.26. Previous phentermine  use caused palpitations. Discussed dietary changes, exercise, and bariatric surgery. - Encourage dietary modifications focusing on vegetables and protein. - Recommend 30 minutes of moderate to vigorous exercise 5 days a week. - Refer to nutritionist. - Provide contact information for bariatric surgery consultation.

## 2024-08-08 ENCOUNTER — Ambulatory Visit: Payer: Self-pay | Admitting: Nurse Practitioner

## 2024-08-08 LAB — LIPID PANEL
Chol/HDL Ratio: 3.2 ratio (ref 0.0–4.4)
Cholesterol, Total: 169 mg/dL (ref 100–199)
HDL: 53 mg/dL (ref >39)
LDL Chol Calc (NIH): 104 mg/dL — ABNORMAL HIGH (ref 0–99)
Triglycerides: 59 mg/dL (ref 0–149)
VLDL Cholesterol Cal: 12 mg/dL (ref 5–40)

## 2024-08-08 LAB — CMP14+EGFR
ALT: 8 IU/L (ref 0–32)
AST: 12 IU/L (ref 0–40)
Albumin: 4.3 g/dL (ref 3.9–4.9)
Alkaline Phosphatase: 97 IU/L (ref 41–116)
BUN/Creatinine Ratio: 19 (ref 9–23)
BUN: 15 mg/dL (ref 6–24)
Bilirubin Total: 0.3 mg/dL (ref 0.0–1.2)
CO2: 23 mmol/L (ref 20–29)
Calcium: 9 mg/dL (ref 8.7–10.2)
Chloride: 101 mmol/L (ref 96–106)
Creatinine, Ser: 0.81 mg/dL (ref 0.57–1.00)
Globulin, Total: 3.1 g/dL (ref 1.5–4.5)
Glucose: 81 mg/dL (ref 70–99)
Potassium: 4.2 mmol/L (ref 3.5–5.2)
Sodium: 136 mmol/L (ref 134–144)
Total Protein: 7.4 g/dL (ref 6.0–8.5)
eGFR: 93 mL/min/1.73 (ref >59)

## 2024-08-08 LAB — CBC
Hematocrit: 40.9 % (ref 34.0–46.6)
Hemoglobin: 12.4 g/dL (ref 11.1–15.9)
MCH: 26.6 pg (ref 26.6–33.0)
MCHC: 30.3 g/dL — ABNORMAL LOW (ref 31.5–35.7)
MCV: 88 fL (ref 79–97)
Platelets: 217 x10E3/uL (ref 150–450)
RBC: 4.66 x10E6/uL (ref 3.77–5.28)
RDW: 14.1 % (ref 11.7–15.4)
WBC: 3.4 x10E3/uL (ref 3.4–10.8)

## 2024-08-08 LAB — TSH: TSH: 1.46 u[IU]/mL (ref 0.450–4.500)

## 2024-08-20 ENCOUNTER — Ambulatory Visit
Admission: RE | Admit: 2024-08-20 | Discharge: 2024-08-20 | Disposition: A | Discharge status: Home / Self Care | Source: Ambulatory Visit | Attending: Nurse Practitioner | Admitting: Nurse Practitioner

## 2024-08-20 DIAGNOSIS — M542 Cervicalgia: Secondary | ICD-10-CM | POA: Diagnosis not present

## 2024-08-31 ENCOUNTER — Other Ambulatory Visit: Payer: Self-pay

## 2024-08-31 ENCOUNTER — Emergency Department (HOSPITAL_COMMUNITY)
Admission: EM | Admit: 2024-08-31 | Discharge: 2024-08-31 | Disposition: A | Discharge status: Home / Self Care | Attending: Emergency Medicine | Admitting: Emergency Medicine

## 2024-08-31 ENCOUNTER — Encounter (HOSPITAL_COMMUNITY): Payer: Self-pay

## 2024-08-31 DIAGNOSIS — M25512 Pain in left shoulder: Secondary | ICD-10-CM | POA: Insufficient documentation

## 2024-08-31 DIAGNOSIS — G8929 Other chronic pain: Secondary | ICD-10-CM | POA: Insufficient documentation

## 2024-08-31 LAB — CBC
HCT: 37.5 % (ref 36.0–46.0)
Hemoglobin: 12 g/dL (ref 12.0–15.0)
MCH: 27.5 pg (ref 26.0–34.0)
MCHC: 32 g/dL (ref 30.0–36.0)
MCV: 86 fL (ref 80.0–100.0)
Platelets: 221 K/uL (ref 150–400)
RBC: 4.36 MIL/uL (ref 3.87–5.11)
RDW: 15.1 % (ref 11.5–15.5)
WBC: 4.7 K/uL (ref 4.0–10.5)
nRBC: 0 % (ref 0.0–0.2)

## 2024-08-31 LAB — COMPREHENSIVE METABOLIC PANEL WITH GFR
ALT: 12 U/L (ref 0–44)
AST: 15 U/L (ref 15–41)
Albumin: 3.5 g/dL (ref 3.5–5.0)
Alkaline Phosphatase: 67 U/L (ref 38–126)
Anion gap: 8 (ref 5–15)
BUN: 12 mg/dL (ref 6–20)
CO2: 23 mmol/L (ref 22–32)
Calcium: 8.6 mg/dL — ABNORMAL LOW (ref 8.9–10.3)
Chloride: 105 mmol/L (ref 98–111)
Creatinine, Ser: 0.73 mg/dL (ref 0.44–1.00)
GFR, Estimated: >60 mL/min (ref >60)
Glucose, Bld: 92 mg/dL (ref 70–99)
Potassium: 4 mmol/L (ref 3.5–5.1)
Sodium: 136 mmol/L (ref 135–145)
Total Bilirubin: 0.4 mg/dL (ref 0.0–1.2)
Total Protein: 6.9 g/dL (ref 6.5–8.1)

## 2024-08-31 LAB — CBG MONITORING, ED: Glucose-Capillary: 97 mg/dL (ref 70–99)

## 2024-08-31 LAB — URINALYSIS, ROUTINE W REFLEX MICROSCOPIC
Bacteria, UA: NONE SEEN
Bilirubin Urine: NEGATIVE
Glucose, UA: NEGATIVE mg/dL
Ketones, ur: NEGATIVE mg/dL
Leukocytes,Ua: NEGATIVE
Nitrite: NEGATIVE
Protein, ur: NEGATIVE mg/dL
RBC / HPF: >50 RBC/hpf (ref 0–5)
Specific Gravity, Urine: 1.014 (ref 1.005–1.030)
pH: 6 (ref 5.0–8.0)

## 2024-08-31 LAB — HCG, SERUM, QUALITATIVE: Preg, Serum: NEGATIVE

## 2024-08-31 NOTE — Discharge Instructions (Addendum)
 Would recommend x-ray of left shoulder and chest x-ray.  Can follow back up with your regular doctor or you can return after he pick up your kids from school.

## 2024-08-31 NOTE — ED Provider Notes (Signed)
 [place] EMERGENCY DEPARTMENT AT [facility] Provider Note   CSN: [id number] Arrival date & time: 08/31/24  1030     Patient presents with: Headache   Priscilla Delgado is a 43 y.o. female.   Patient with several week complaint of pain to the top of the left shoulder and kind of at the base of the neck.  It radiates down towards the elbow but does not go into the hand no numbness or weakness or shooting pain into the hand.  Occasionally associated with some intermittent shortness of breath.  Patient primary care doctor at the end of October ordered CT cervical spine which was negative.  Patient did not know those results.  Recommended that maybe we do the x-ray of the left shoulder and chest x-ray and then she can follow-up with her regular doctor.  Patient states that she needs to leave to go get her kids.  So I we will order that in case she does not leave otherwise she knows she can follow-up with her regular doctor or return later.  Patient denies any chest pain or any shortness of breath currently.  Oxygen saturation is 100% on room air.       Prior to Admission medications   Medication Sig Start Date End Date Taking? Authorizing Provider  Acetaminophen  (TYLENOL  8 HOUR PO) Take by mouth.    [provider]  cyclobenzaprine  (FLEXERIL ) 5 MG tablet Take 1 tablet (5 mg total) by mouth at bedtime. 07/09/24   Rising, Asberry, PA-C  polyethylene glycol powder (GLYCOLAX /MIRALAX ) 17 GM/SCOOP powder Take 17 g by mouth daily as needed. Dissolve 1 capful (17g) in 4-8 ounces of liquid and take by mouth daily. 08/07/24   Paseda, Folashade R, FNP    Allergies: Patient has no known allergies.    Review of Systems  Constitutional:  Negative for chills and fever.  HENT:  Negative for ear pain and sore throat.   Eyes:  Negative for pain and visual disturbance.  Respiratory:  Positive for shortness of breath. Negative for cough.   Cardiovascular:  Negative for chest pain and  palpitations.  Gastrointestinal:  Negative for abdominal pain and vomiting.  Genitourinary:  Negative for dysuria and hematuria.  Musculoskeletal:  Negative for arthralgias and back pain.  Skin:  Negative for color change and rash.  Neurological:  Negative for seizures, syncope, weakness and numbness.  All other systems reviewed and are negative.   Updated Vital Signs BP 118/79 (BP Location: Right Arm)   Pulse (!) 58   Temp 98.6 F (37 C) (Oral)   Resp 15   Ht 1.676 m (5' 6)   Wt 135.6 kg   LMP 08/31/2024   SpO2 100%   BMI 48.26 kg/m   Physical Exam Vitals and nursing note reviewed.  Constitutional:      General: She is not in acute distress.    Appearance: Normal appearance. She is well-developed. She is not ill-appearing.  HENT:     Head: Normocephalic and atraumatic.  Eyes:     Extraocular Movements: Extraocular movements intact.     Conjunctiva/sclera: Conjunctivae normal.     Pupils: Pupils are equal, round, and reactive to light.  Cardiovascular:     Rate and Rhythm: Normal rate and regular rhythm.     Heart sounds: No murmur heard. Pulmonary:     Effort: Pulmonary effort is normal. No respiratory distress.     Breath sounds: Normal breath sounds.  Abdominal:     Palpations: Abdomen is  soft.     Tenderness: There is no abdominal tenderness.  Musculoskeletal:        General: No swelling.     Cervical back: Neck supple.     Comments: Left radial pulses 2+.  Good range of motion of fingers sensation intact good strength.  Good movement at the wrist elbow and shoulder.  Skin:    General: Skin is warm and dry.     Capillary Refill: Capillary refill takes less than 2 seconds.  Neurological:     General: No focal deficit present.     Mental Status: She is alert and oriented to person, place, and time.     Cranial Nerves: No cranial nerve deficit.     Sensory: No sensory deficit.     Motor: No weakness.  Psychiatric:        Mood and Affect: Mood normal.      (all labs ordered are listed, but only abnormal results are displayed) Labs Reviewed  COMPREHENSIVE METABOLIC PANEL WITH GFR - Abnormal; Notable for the following components:      Result Value   Calcium 8.6 (*)    All other components within normal limits  URINALYSIS, ROUTINE W REFLEX MICROSCOPIC - Abnormal; Notable for the following components:   Hgb urine dipstick LARGE (*)    All other components within normal limits  CBC  HCG, SERUM, QUALITATIVE  CBG MONITORING, ED    EKG: EKG Interpretation Date/Time:  Friday August 31 2024 10:46:59 EST Ventricular Rate:  64 PR Interval:  186 QRS Duration:  88 QT Interval:  392 QTC Calculation: 404 R Axis:   56  Text Interpretation: Normal sinus rhythm Normal ECG When compared with ECG of 20-Mar-2022 10:29, PREVIOUS ECG IS PRESENT Confirmed by Admiral Marcucci [phone]) on 08/31/2024 2:19:46 PM  Radiology: No results found.   Procedures   Medications Ordered in the ED - No data to display                                  Medical Decision Making Amount and/or Complexity of Data Reviewed Labs: ordered. Radiology: ordered.   Blood sugar 97 complete metabolic panel liver function test renal function and electrolytes normal.  CBC white count normal at 4.7 hemoglobin 12.0 platelets 221.  Urinalysis is pending.  hCG was negative.  EKG was normal sinus rhythm.  Those been present for a while.  Is possible that it could be an anginal equivalent.  CT cervical spine reassuring but does not rule out any kind of pinched nerves.  Do not think that this is PE or cardiac in nature.  Probably okay for patient to go home and follow-up with her doctor or return.  Since patient has to leave.  Complete metabolic panel normal CBC normal urinalysis negative pregnancy test negative.  As stated above.  Final diagnoses:  Chronic left shoulder pain    ED Discharge Orders     None          Geraldene Hamilton, MD 08/31/24 1434

## 2024-08-31 NOTE — ED Triage Notes (Signed)
 Pt c/o upper back pain, neck pain, and headache with heaviness in left arm for past two months. Pt states she has taken ibuprofen  w/o relief. Pt denies nausea or vomiting, has had shortness of breath. Pt has had intermittent dizziness.

## 2024-10-08 ENCOUNTER — Ambulatory Visit: Payer: Self-pay | Admitting: Nurse Practitioner

## 2024-10-09 DIAGNOSIS — R9431 Abnormal electrocardiogram [ECG] [EKG]: Secondary | ICD-10-CM | POA: Diagnosis not present

## 2024-10-09 DIAGNOSIS — E66813 Obesity, class 3: Secondary | ICD-10-CM | POA: Diagnosis not present

## 2024-10-09 DIAGNOSIS — Z6841 Body Mass Index (BMI) 40.0 and over, adult: Secondary | ICD-10-CM | POA: Diagnosis not present

## 2024-10-09 DIAGNOSIS — Z136 Encounter for screening for cardiovascular disorders: Secondary | ICD-10-CM | POA: Diagnosis not present

## 2024-10-09 DIAGNOSIS — Z01818 Encounter for other preprocedural examination: Secondary | ICD-10-CM | POA: Diagnosis not present

## 2024-10-15 DIAGNOSIS — Z7189 Other specified counseling: Secondary | ICD-10-CM | POA: Diagnosis not present

## 2024-10-15 DIAGNOSIS — F54 Psychological and behavioral factors associated with disorders or diseases classified elsewhere: Secondary | ICD-10-CM | POA: Diagnosis not present

## 2024-10-23 DIAGNOSIS — E66813 Obesity, class 3: Secondary | ICD-10-CM | POA: Diagnosis not present

## 2024-10-23 DIAGNOSIS — Z6841 Body Mass Index (BMI) 40.0 and over, adult: Secondary | ICD-10-CM | POA: Diagnosis not present
